# Patient Record
Sex: Female | Born: 1965
Health system: Southern US, Community
[De-identification: ages and names within clinical notes are randomized; demographics above are authoritative.]

## PROBLEM LIST (undated history)

## (undated) DIAGNOSIS — K219 Gastro-esophageal reflux disease without esophagitis: Secondary | ICD-10-CM

## (undated) DIAGNOSIS — I5020 Unspecified systolic (congestive) heart failure: Secondary | ICD-10-CM

## (undated) DIAGNOSIS — E785 Hyperlipidemia, unspecified: Secondary | ICD-10-CM

## (undated) DIAGNOSIS — E119 Type 2 diabetes mellitus without complications: Secondary | ICD-10-CM

## (undated) DIAGNOSIS — I10 Essential (primary) hypertension: Secondary | ICD-10-CM

## (undated) DIAGNOSIS — M545 Low back pain, unspecified: Secondary | ICD-10-CM

## (undated) DIAGNOSIS — I2510 Atherosclerotic heart disease of native coronary artery without angina pectoris: Secondary | ICD-10-CM

## (undated) HISTORY — PX: CORONARY ANGIOPLASTY: SHX604

## (undated) HISTORY — DX: Type 2 diabetes mellitus without complications: E11.9

## (undated) HISTORY — DX: Essential (primary) hypertension: I10

## (undated) HISTORY — DX: Gastro-esophageal reflux disease without esophagitis: K21.9

## (undated) HISTORY — DX: Low back pain, unspecified: M54.50

## (undated) HISTORY — DX: Low back pain: M54.5

## (undated) HISTORY — DX: Unspecified systolic (congestive) heart failure: I50.20

## (undated) HISTORY — PX: TUBAL LIGATION: SHX77

---

## 2005-04-09 ENCOUNTER — Other Ambulatory Visit: Admission: RE | Admit: 2005-04-09 | Discharge: 2005-04-09 | Payer: Self-pay | Admitting: Family Medicine

## 2005-04-15 ENCOUNTER — Ambulatory Visit (HOSPITAL_COMMUNITY): Admission: RE | Admit: 2005-04-15 | Discharge: 2005-04-15 | Payer: Self-pay | Admitting: Family Medicine

## 2008-08-30 ENCOUNTER — Emergency Department (HOSPITAL_COMMUNITY): Admission: EM | Admit: 2008-08-30 | Discharge: 2008-08-31 | Payer: Self-pay | Admitting: Emergency Medicine

## 2009-03-28 ENCOUNTER — Other Ambulatory Visit: Admission: RE | Admit: 2009-03-28 | Discharge: 2009-03-28 | Payer: Self-pay | Admitting: Family Medicine

## 2009-05-02 ENCOUNTER — Ambulatory Visit (HOSPITAL_COMMUNITY): Admission: RE | Admit: 2009-05-02 | Discharge: 2009-05-02 | Payer: Self-pay | Admitting: Family Medicine

## 2010-08-14 LAB — CBC
HCT: 37 % (ref 36.0–46.0)
MCHC: 32.6 g/dL (ref 30.0–36.0)
MCV: 69.4 fL — ABNORMAL LOW (ref 78.0–100.0)
Platelets: 317 10*3/uL (ref 150–400)

## 2010-08-14 LAB — DIFFERENTIAL
Basophils Relative: 1 % (ref 0–1)
Eosinophils Absolute: 0.1 10*3/uL (ref 0.0–0.7)
Lymphs Abs: 2 10*3/uL (ref 0.7–4.0)
Monocytes Absolute: 0.6 10*3/uL (ref 0.1–1.0)
Neutrophils Relative %: 60 % (ref 43–77)

## 2010-08-14 LAB — POCT I-STAT, CHEM 8
BUN: 12 mg/dL (ref 6–23)
Calcium, Ion: 1.23 mmol/L (ref 1.12–1.32)
Chloride: 107 mEq/L (ref 96–112)
Glucose, Bld: 148 mg/dL — ABNORMAL HIGH (ref 70–99)
TCO2: 27 mmol/L (ref 0–100)

## 2010-08-14 LAB — POCT CARDIAC MARKERS: Troponin i, poc: 0.13 ng/mL — ABNORMAL HIGH (ref 0.00–0.09)

## 2010-08-14 LAB — CK TOTAL AND CKMB (NOT AT ARMC)
CK, MB: 0.5 ng/mL (ref 0.3–4.0)
Relative Index: INVALID (ref 0.0–2.5)
Total CK: 57 U/L (ref 7–177)

## 2010-08-14 LAB — D-DIMER, QUANTITATIVE: D-Dimer, Quant: 0.27 ug/mL-FEU (ref 0.00–0.48)

## 2010-08-14 LAB — TROPONIN I: Troponin I: 0.01 ng/mL (ref 0.00–0.06)

## 2014-10-24 ENCOUNTER — Emergency Department (HOSPITAL_COMMUNITY): Payer: 59

## 2014-10-24 ENCOUNTER — Emergency Department (HOSPITAL_COMMUNITY)
Admission: EM | Admit: 2014-10-24 | Discharge: 2014-10-24 | Disposition: A | Discharge status: Home / Self Care | Payer: 59 | Attending: Emergency Medicine | Admitting: Emergency Medicine

## 2014-10-24 ENCOUNTER — Encounter (HOSPITAL_COMMUNITY): Payer: Self-pay | Admitting: *Deleted

## 2014-10-24 DIAGNOSIS — R000 Tachycardia, unspecified: Secondary | ICD-10-CM | POA: Insufficient documentation

## 2014-10-24 DIAGNOSIS — Z720 Tobacco use: Secondary | ICD-10-CM | POA: Diagnosis not present

## 2014-10-24 DIAGNOSIS — I10 Essential (primary) hypertension: Secondary | ICD-10-CM | POA: Insufficient documentation

## 2014-10-24 DIAGNOSIS — R0601 Orthopnea: Secondary | ICD-10-CM | POA: Diagnosis not present

## 2014-10-24 DIAGNOSIS — R0789 Other chest pain: Secondary | ICD-10-CM | POA: Diagnosis not present

## 2014-10-24 DIAGNOSIS — E119 Type 2 diabetes mellitus without complications: Secondary | ICD-10-CM | POA: Diagnosis not present

## 2014-10-24 DIAGNOSIS — R0602 Shortness of breath: Secondary | ICD-10-CM | POA: Diagnosis present

## 2014-10-24 LAB — COMPREHENSIVE METABOLIC PANEL
ALBUMIN: 3.6 g/dL (ref 3.5–5.0)
ALT: 114 U/L — ABNORMAL HIGH (ref 14–54)
ANION GAP: 7 (ref 5–15)
AST: 107 U/L — AB (ref 15–41)
Alkaline Phosphatase: 83 U/L (ref 38–126)
BILIRUBIN TOTAL: 0.5 mg/dL (ref 0.3–1.2)
BUN: 15 mg/dL (ref 6–20)
CALCIUM: 8.9 mg/dL (ref 8.9–10.3)
CHLORIDE: 108 mmol/L (ref 101–111)
CO2: 23 mmol/L (ref 22–32)
CREATININE: 1.09 mg/dL — AB (ref 0.44–1.00)
GFR calc Af Amer: >60 mL/min (ref >60)
GFR, EST NON AFRICAN AMERICAN: 58 mL/min — AB (ref >60)
Glucose, Bld: 183 mg/dL — ABNORMAL HIGH (ref 65–99)
Potassium: 3.9 mmol/L (ref 3.5–5.1)
Sodium: 138 mmol/L (ref 135–145)
TOTAL PROTEIN: 7.1 g/dL (ref 6.5–8.1)

## 2014-10-24 LAB — CBC WITH DIFFERENTIAL/PLATELET
BASOS ABS: 0 10*3/uL (ref 0.0–0.1)
Basophils Relative: 1 % (ref 0–1)
EOS PCT: 2 % (ref 0–5)
Eosinophils Absolute: 0.1 10*3/uL (ref 0.0–0.7)
HCT: 40.2 % (ref 36.0–46.0)
Hemoglobin: 13.7 g/dL (ref 12.0–15.0)
LYMPHS ABS: 1.9 10*3/uL (ref 0.7–4.0)
Lymphocytes Relative: 40 % (ref 12–46)
MCH: 22.6 pg — AB (ref 26.0–34.0)
MCHC: 34.1 g/dL (ref 30.0–36.0)
MCV: 66.2 fL — ABNORMAL LOW (ref 78.0–100.0)
MONO ABS: 0.2 10*3/uL (ref 0.1–1.0)
Monocytes Relative: 4 % (ref 3–12)
Neutro Abs: 2.6 10*3/uL (ref 1.7–7.7)
Neutrophils Relative %: 53 % (ref 43–77)
PLATELETS: 324 10*3/uL (ref 150–400)
RBC: 6.07 MIL/uL — ABNORMAL HIGH (ref 3.87–5.11)
RDW: 15.9 % — AB (ref 11.5–15.5)
WBC: 4.8 10*3/uL (ref 4.0–10.5)

## 2014-10-24 LAB — I-STAT TROPONIN, ED
TROPONIN I, POC: 0 ng/mL (ref 0.00–0.08)
Troponin i, poc: 0.02 ng/mL (ref 0.00–0.08)

## 2014-10-24 LAB — BRAIN NATRIURETIC PEPTIDE: B NATRIURETIC PEPTIDE 5: 685 pg/mL — AB (ref 0.0–100.0)

## 2014-10-24 MED ORDER — FUROSEMIDE 10 MG/ML IJ SOLN
20.0000 mg | Freq: Once | INTRAMUSCULAR | Status: AC
  Administered 2014-10-24: 20 mg via INTRAVENOUS
  Filled 2014-10-24: qty 2

## 2014-10-24 MED ORDER — ALBUTEROL SULFATE (2.5 MG/3ML) 0.083% IN NEBU
5.0000 mg | INHALATION_SOLUTION | Freq: Once | RESPIRATORY_TRACT | Status: AC
  Administered 2014-10-24: 5 mg via RESPIRATORY_TRACT
  Filled 2014-10-24: qty 6

## 2014-10-24 MED ORDER — ALBUTEROL SULFATE HFA 108 (90 BASE) MCG/ACT IN AERS: 1.0000 | INHALATION_SPRAY | RESPIRATORY_TRACT | Status: DC | PRN

## 2014-10-24 MED ORDER — FUROSEMIDE 20 MG PO TABS: 20.0000 mg | ORAL_TABLET | Freq: Every day | ORAL | Status: DC

## 2014-10-24 MED ORDER — SODIUM CHLORIDE 0.9 % IV BOLUS (SEPSIS)
250.0000 mL | Freq: Once | INTRAVENOUS | Status: AC
  Administered 2014-10-24: 250 mL via INTRAVENOUS

## 2014-10-24 NOTE — ED Notes (Signed)
Pt ambulated to bathroom with pulse ox on. Pts O2 stat stayed around 93%. Pt stated she was feeling slightly short of breath when she got back in the bed but not as short of breath as she was when she came in to the hospital. RN made aware

## 2014-10-24 NOTE — ED Notes (Signed)
MD Harrison at bedside. 

## 2014-10-24 NOTE — ED Notes (Signed)
Pt reports onset at 0300 of chest pain and difficulty "catching her breath." sob is worse when lying down. Reports recent cough. Denies swelling to extremities.

## 2014-10-24 NOTE — ED Notes (Signed)
Pt placed in gown and in bed. Pt monitored by pulse ox, bp cuff, and 12-lead. 

## 2014-10-24 NOTE — ED Notes (Signed)
Patient placed on 2L Nasal cannula.  Room air saturation at 91%, at 2L 95%.    Family at bedside.

## 2014-10-24 NOTE — ED Notes (Signed)
Dr. Harrison at bedside with patient. 

## 2014-10-24 NOTE — ED Provider Notes (Signed)
CSN: [id number]     Arrival date & time 10/24/14  0709 History   First MD Initiated Contact with Patient 10/24/14 [phone]     Chief Complaint  Patient presents with  . Chest Pain  . Shortness of Breath     (Consider location/radiation/quality/duration/timing/severity/associated sxs/prior Treatment) Patient is a 49 y.o. female presenting with chest pain and shortness of breath. The history is provided by the patient.  Chest Pain Pain location:  L chest and R chest Pain quality: tightness   Pain radiates to:  Does not radiate Pain radiates to the back: no   Pain severity:  Mild Onset quality:  Sudden Timing:  Constant Progression:  Unchanged Associated symptoms: shortness of breath   Associated symptoms: no abdominal pain, no back pain, no dizziness, no fatigue, no fever, no headache, no nausea and not vomiting   Shortness of breath:    Severity:  Mild   Onset quality:  Sudden   Duration:  5 hours   Timing:  Constant   Progression:  Unchanged Shortness of Breath Associated symptoms: no abdominal pain, no chest pain, no fever, no headaches, no neck pain and no vomiting     Past Medical History  Diagnosis Date  . Hypertension   . Diabetes mellitus without complication    History reviewed. No pertinent past surgical history. History reviewed. No pertinent family history. History  Substance Use Topics  . Smoking status: Current Every Day Smoker    Types: Cigarettes  . Smokeless tobacco: Not on file  . Alcohol Use: No   OB History    No data available     Review of Systems  Constitutional: Negative for fever and fatigue.  HENT: Negative for congestion and drooling.   Eyes: Negative for pain.  Respiratory: Positive for chest tightness and shortness of breath.   Cardiovascular: Negative for chest pain.  Gastrointestinal: Negative for nausea, vomiting, abdominal pain and diarrhea.  Genitourinary: Negative for dysuria and hematuria.  Musculoskeletal: Negative for back  pain, gait problem and neck pain.  Skin: Negative for color change.  Neurological: Negative for dizziness and headaches.  Hematological: Negative for adenopathy.  Psychiatric/Behavioral: Negative for behavioral problems.  All other systems reviewed and are negative.     Allergies  Review of patient's allergies indicates no known allergies.  Home Medications   Prior to Admission medications   Not on File   BP 192/126 mmHg  Pulse 122  Temp(Src) 98.1 F (36.7 C) (Oral)  Resp 22  SpO2 93% Physical Exam  Constitutional: She is oriented to person, place, and time. She appears well-developed and well-nourished.  HENT:  Head: Normocephalic and atraumatic.  Mouth/Throat: Oropharynx is clear and moist. No oropharyngeal exudate.  Eyes: Conjunctivae and EOM are normal. Pupils are equal, round, and reactive to light.  Neck: Normal range of motion. Neck supple.  Cardiovascular: Normal rate, regular rhythm, normal heart sounds and intact distal pulses.  Exam reveals no gallop and no friction rub.   No murmur heard. Pulmonary/Chest: She is in respiratory distress.  Tachypneic. Diminished BS bilaterally.   Abdominal: Soft. Bowel sounds are normal. There is no tenderness. There is no rebound and no guarding.  Musculoskeletal: Normal range of motion. She exhibits no edema or tenderness.  Symmetric LE's w/out focal ttp.   Neurological: She is alert and oriented to person, place, and time.  Skin: Skin is warm and dry.  Psychiatric: She has a normal mood and affect. Her behavior is normal.  Nursing note and vitals reviewed.  ED Course  Procedures (including critical care time) Labs Review Labs Reviewed  CBC WITH DIFFERENTIAL/PLATELET - Abnormal; Notable for the following:    RBC 6.07 (*)    MCV 66.2 (*)    MCH 22.6 (*)    RDW 15.9 (*)    All other components within normal limits  COMPREHENSIVE METABOLIC PANEL - Abnormal; Notable for the following:    Glucose, Bld 183 (*)     Creatinine, Ser 1.09 (*)    AST 107 (*)    ALT 114 (*)    GFR calc non Af Amer 58 (*)    All other components within normal limits  BRAIN NATRIURETIC PEPTIDE - Abnormal; Notable for the following:    B Natriuretic Peptide 685.0 (*)    All other components within normal limits  I-STAT TROPOININ, ED  Rosezena Sensor, ED    Imaging Review Dg Chest 2 View  10/24/2014   CLINICAL DATA:  Shortness of breath, progressing  EXAM: CHEST  2 VIEW  COMPARISON:  August 30, 2008  FINDINGS: There is interstitial edema. No airspace consolidation. Heart is enlarged with pulmonary venous hypertension. No adenopathy. No bone lesions.  IMPRESSION: Evidence of a degree of congestive heart failure. No edema or consolidation.   Electronically Signed   By: Bretta Bang III M.D.   On: 10/24/2014 08:01     EKG Interpretation   Date/Time:  Tuesday October 24 2014 07:16:29 EDT Ventricular Rate:  120 PR Interval:  134 QRS Duration: 78 QT Interval:  338 QTC Calculation: 478 R Axis:   51 Text Interpretation:  Sinus tachycardia LAE, consider biatrial enlargement  Anterior infarct, old Nonspecific T abnormalities, lateral leads Baseline  wander in lead(s) II III aVF Confirmed by Lanisa Ishler  MD, Shronda Boeh ([id number]) on  10/24/2014 10:10:59 AM      MDM   Final diagnoses:  SOB (shortness of breath)  Orthopnea    7:28 AM 49 y.o. female with history of hypertension, diabetes, smoker who presents with shortness of breath. She notes that she has started becoming short of breath when lying supine over the last 20-30 days. Does have chest tightness w/ the sob. Tachycardic, tachypneic on exam. O2 sat 92% on RA. Hypertensive. She denies any pleuritic-type chest pain. Symptoms seem to be only related to lying supine. No lower extremity edema noted. She does have diminished breath sounds bilaterally. We'll give an albuterol treatment and get screening labs and imaging.  1:07 PM: Mildly elevated bnp. Delta trop neg. Likely new  mild CHF. Pt now feeling much better, denies cp or sob. Not hypoxic w/ ambulation. Called trish w/ cards who will arrange close f/u. Will start low dose lasix for a few days and have pt sleep at an angle to avoid orthopnea. She does have a pcp as well. I have discussed the diagnosis/risks/treatment options with the patient and family and believe the pt to be eligible for discharge home to follow-up with her pcp and cardiology. We also discussed returning to the ED immediately if new or worsening sx occur. We discussed the sx which are most concerning (e.g., worsening sob/cp) that necessitate immediate return. Medications administered to the patient during their visit and any new prescriptions provided to the patient are listed below.  Medications given during this visit Medications  albuterol (PROVENTIL) (2.5 MG/3ML) 0.083% nebulizer solution 5 mg (5 mg Nebulization Given 10/24/14 0743)  sodium chloride 0.9 % bolus 250 mL (0 mLs Intravenous Stopped 10/24/14 0830)  furosemide (LASIX) injection 20 mg (20 mg  Intravenous Given 10/24/14 1246)    New Prescriptions   ALBUTEROL (PROVENTIL HFA;VENTOLIN HFA) 108 (90 BASE) MCG/ACT INHALER    Inhale 1-2 puffs into the lungs every 4 (four) hours as needed for wheezing or shortness of breath.   FUROSEMIDE (LASIX) 20 MG TABLET    Take 1 tablet (20 mg total) by mouth daily.      Purvis Sheffield, MD 10/24/14 1640

## 2014-11-09 ENCOUNTER — Ambulatory Visit (INDEPENDENT_AMBULATORY_CARE_PROVIDER_SITE_OTHER): Payer: Commercial Managed Care - HMO | Admitting: Cardiology

## 2014-11-09 ENCOUNTER — Encounter: Payer: Self-pay | Admitting: Cardiology

## 2014-11-09 VITALS — BP 170/100 | HR 100 | Ht 64.0 in | Wt 184.0 lb

## 2014-11-09 DIAGNOSIS — R945 Abnormal results of liver function studies: Secondary | ICD-10-CM

## 2014-11-09 DIAGNOSIS — Z720 Tobacco use: Secondary | ICD-10-CM

## 2014-11-09 DIAGNOSIS — R0601 Orthopnea: Secondary | ICD-10-CM

## 2014-11-09 DIAGNOSIS — R0602 Shortness of breath: Secondary | ICD-10-CM | POA: Diagnosis not present

## 2014-11-09 DIAGNOSIS — E118 Type 2 diabetes mellitus with unspecified complications: Secondary | ICD-10-CM

## 2014-11-09 DIAGNOSIS — I5033 Acute on chronic diastolic (congestive) heart failure: Secondary | ICD-10-CM | POA: Diagnosis not present

## 2014-11-09 DIAGNOSIS — I10 Essential (primary) hypertension: Secondary | ICD-10-CM

## 2014-11-09 DIAGNOSIS — R7989 Other specified abnormal findings of blood chemistry: Secondary | ICD-10-CM

## 2014-11-09 NOTE — Progress Notes (Signed)
Cardiology Office Note  Date: 11/09/2014   ID: Tikisha Molinaro, DOB 1965-11-07, MRN [id number]  PCP: Allean Found, MD  Consulting Cardiologist: Nona Dell, MD   Chief Complaint  Patient presents with  . Suspected CHF  . Shortness of Breath    History of Present Illness: Sandra Owens is a 49 y.o. female referred for cardiology consultation by Dr. Nicholos Johns (regular PCP is Dr. Katrinka Blazing). Records indicate that she was seen in the ER on June 21 with shortness of breath and orthopnea. Chest x-ray reported interstitial edema with cardiomegaly and findings of possible pulmonary venous hypertension. BNP was 685. Troponin I levels were normal. She was treated with albuterol as well as Lasix and discharged home for further outpatient workup. She saw Dr. Nicholos Johns on June 22 at which time it was noted that she has been out of regular medications for type 2 diabetes mellitus and hypertension. She was started on Januvia and Talmisartan HCTZ. Labwork and ECG done recently are outlined below.  She is here today with her husband. She states that she has not taken any regular medications for her diabetes or hypertension for the last few years. She reports compliance with the recently started medications, and has been feeling better in terms of her orthopnea.  She tells me that she has not noticed specific exertional shortness of breath, mainly orthopnea that wakes her up at nighttime. This began back in May. No wheezing during the daytime.  No leg swelling. Sometimes a dry cough. She does not endorse any chest tightness or heaviness.  She reports a 20 year history of diabetes and hypertension. Reports early heart disease in her father who underwent CABG in his 98s. Also family history of hypertension and type 2 diabetes mellitus.  Blood pressure was elevated today, she had not yet taken her medications this morning however.  Past Medical History  Diagnosis Date  . Essential hypertension   .  Type 2 diabetes mellitus   . GERD (gastroesophageal reflux disease)   . Low back pain     Past Surgical History  Procedure Laterality Date  . Tubal ligation      Current Outpatient Prescriptions  Medication Sig Dispense Refill  . albuterol (PROVENTIL HFA;VENTOLIN HFA) 108 (90 BASE) MCG/ACT inhaler Inhale 1-2 puffs into the lungs every 4 (four) hours as needed for wheezing or shortness of breath. 1 Inhaler 0  . Fluticasone Furoate-Vilanterol (BREO ELLIPTA) 100-25 MCG/INH AEPB Inhale into the lungs.    . furosemide (LASIX) 20 MG tablet Take 1 tablet (20 mg total) by mouth daily. 10 tablet 0  . glucose blood test strip 1 each by Other route as needed for other. Use as instructed    . ibuprofen (ADVIL,MOTRIN) 200 MG tablet Take 400 mg by mouth every 6 (six) hours as needed for mild pain or moderate pain.    . sitaGLIPtin (JANUVIA) 100 MG tablet Take 100 mg by mouth daily.    Marland Kitchen telmisartan-hydrochlorothiazide (MICARDIS HCT) 80-25 MG per tablet Take 1 tablet by mouth daily.     No current facility-administered medications for this visit.    Allergies:  Review of patient's allergies indicates no known allergies.   Social History: The patient  reports that she has been smoking Cigarettes.  She has been smoking about 0.50 packs per day. She does not have any smokeless tobacco history on file. She reports that she does not drink alcohol or use illicit drugs.   Family History: The patient's family history includes CAD in her  father; Diabetes Mellitus II in her father and mother; Hypertension in her father and mother.   ROS:  Please see the history of present illness. Otherwise, complete review of systems is positive for  Occasional brief feeling of forceful heartbeat. No dizziness or syncope. No claudication.  Occasional reflux.  All other systems are reviewed and negative.   Physical Exam: VS:  BP 170/100 mmHg  Pulse 100  Ht 5\' 4"  (1.626 m)  Wt 184 lb (83.462 kg)  BMI 31.57 kg/m2  SpO2  98%, BMI Body mass index is 31.57 kg/(m^2).  Wt Readings from Last 3 Encounters:  11/09/14 184 lb (83.462 kg)     General: Patient appears comfortable at rest. HEENT: Conjunctiva and lids normal, oropharynx clear. Neck: Supple, no elevated JVP or carotid bruits, no thyromegaly. Lungs: Clear to auscultation, nonlabored breathing at rest. Cardiac: Regular rate and rhythm, S4, no significant systolic murmur, no pericardial rub. Abdomen: Soft, nontender,bowel sounds present, no guarding or rebound. Extremities: No pitting edema, distal pulses 2+. Skin: Warm and dry. Musculoskeletal: No kyphosis. Neuropsychiatric: Alert and oriented x3, affect grossly appropriate.   ECG: Tracing from June 22 showed sinus tachycardia with possible biatrial enlargement, poor R-wave progression - rule out old anterior infarct pattern, nonspecific ST-T changes.  Recent Labwork: 10/24/2014: ALT 114*; AST 107*; B Natriuretic Peptide 685.0*; BUN 15; Creatinine, Ser 1.09*; Hemoglobin 13.7; Platelets 324; Potassium 3.9; Sodium 138   Other Studies Reviewed Today:  Chest x-ray 10/24/2014: FINDINGS: There is interstitial edema. No airspace consolidation. Heart is enlarged with pulmonary venous hypertension. No adenopathy. No bone lesions.  IMPRESSION: Evidence of a degree of congestive heart failure. No edema or Consolidation.   ASSESSMENT AND PLAN:  1.  Reported orthopnea and shortness of breath since May, improving with recent medication interventions following ER visit and primary care provider evaluation. Evaluation in the ER did suggest evidence of volume overload by chest x-ray and elevated BNP, although she could have diastolic heart failure in the setting of uncontrolled hypertension and long-standing diabetes. Plan at this time is to proceed with an echocardiogram to better assess cardiac structure and function, and also ischemic workup via Lexiscan Cardiolite.  Cardiac risk factors include long-standing  hypertension and type 2 diabetes mellitus as well as premature CAD in her father.We will plan to bring her back to the office to discuss the results and next step.  2.  Essential hypertension, blood pressure elevated, had not taken  medications yet this morning.  Will likely need further adjustments depending on above test.  3.  Type 2 diabetes mellitus , recently back on medication. Keep follow-up with primary care provider.  4.  Abnormal LFTs , AST 107 and ALT 114 based on lab work in ER. Etiology uncertain at this point. Cardiac structural evaluation pending to exclude passive congestion. She will need further workup with her primary care provider.  5.  Tobacco abuse. Importance of smoking cessation discussed.  6.  History of medication noncompliance. She reports taking her medications now regularly.  Current medicines were reviewed at length with the patient today.   Orders Placed This Encounter  Procedures  . NM Myocar Multi W/Spect W/Wall Motion / EF  . Myocardial Perfusion Imaging  . Echocardiogram    Disposition: FU with me in 3 weeks.   Signed, Jonelle Sidle, MD, [facility] 11/09/2014 9:14 AM    [facility] at [facility] [address] [place], [place] [address] Phone: [phone]; Fax: [phone]

## 2014-11-09 NOTE — Patient Instructions (Signed)
Your physician recommends that you schedule a follow-up appointment in: after tests   Your physician recommends that you continue on your current medications as directed. Please refer to the Current Medication list given to you today.    Your physician has requested that you have an echocardiogram. Echocardiography is a painless test that uses sound waves to create images of your heart. It provides your doctor with information about the size and shape of your heart and how well your heart's chambers and valves are working. This procedure takes approximately one hour. There are no restrictions for this procedure.    Your physician has requested that you have a lexiscan myoview. For further information please visit [email or web address] Please follow instruction sheet, as given.    Thank you for choosing [facility] !

## 2014-11-16 ENCOUNTER — Inpatient Hospital Stay (HOSPITAL_COMMUNITY): Admission: RE | Admit: 2014-11-16 | Payer: 59 | Source: Ambulatory Visit

## 2014-11-16 ENCOUNTER — Ambulatory Visit (HOSPITAL_COMMUNITY): Payer: 59

## 2014-11-16 ENCOUNTER — Encounter (HOSPITAL_COMMUNITY): Payer: 59

## 2014-11-16 ENCOUNTER — Other Ambulatory Visit (HOSPITAL_COMMUNITY): Payer: 59

## 2014-11-17 ENCOUNTER — Encounter (HOSPITAL_COMMUNITY)
Admission: RE | Admit: 2014-11-17 | Discharge: 2014-11-17 | Disposition: A | Discharge status: Home / Self Care | Payer: Commercial Managed Care - HMO | Source: Ambulatory Visit | Attending: Cardiology | Admitting: Cardiology

## 2014-11-17 ENCOUNTER — Encounter (HOSPITAL_COMMUNITY): Payer: Self-pay

## 2014-11-17 ENCOUNTER — Ambulatory Visit (HOSPITAL_BASED_OUTPATIENT_CLINIC_OR_DEPARTMENT_OTHER)
Admission: RE | Admit: 2014-11-17 | Discharge: 2014-11-17 | Disposition: A | Discharge status: Home / Self Care | Payer: Commercial Managed Care - HMO | Source: Ambulatory Visit | Attending: Cardiology | Admitting: Cardiology

## 2014-11-17 ENCOUNTER — Ambulatory Visit (HOSPITAL_COMMUNITY): Admission: RE | Admit: 2014-11-17 | Payer: 59 | Source: Ambulatory Visit

## 2014-11-17 DIAGNOSIS — I5033 Acute on chronic diastolic (congestive) heart failure: Secondary | ICD-10-CM | POA: Diagnosis present

## 2014-11-17 DIAGNOSIS — I509 Heart failure, unspecified: Secondary | ICD-10-CM

## 2014-11-17 DIAGNOSIS — R0602 Shortness of breath: Secondary | ICD-10-CM

## 2014-11-17 LAB — NM MYOCAR MULTI W/SPECT W/WALL MOTION / EF
CHL CUP NUCLEAR SSS: 9
CHL RATE OF PERCEIVED EXERTION: 13
CSEPEDS: 58 s
CSEPEW: 7 METS
CSEPHR: 90 %
Exercise duration (min): 4 min
LHR: 0.29
LV dias vol: 146 mL
LVSYSVOL: 117 mL
MPHR: 170 {beats}/min
Peak HR: 153 {beats}/min
Rest HR: 96 {beats}/min
SDS: 9
SRS: 0
TID: 1.09

## 2014-11-17 MED ORDER — REGADENOSON 0.4 MG/5ML IV SOLN
INTRAVENOUS | Status: AC
  Filled 2014-11-17: qty 5

## 2014-11-17 MED ORDER — TECHNETIUM TC 99M SESTAMIBI - CARDIOLITE
10.0000 | Freq: Once | INTRAVENOUS | Status: AC | PRN
  Administered 2014-11-17: 07:00:00 9 via INTRAVENOUS

## 2014-11-17 MED ORDER — SODIUM CHLORIDE 0.9 % IJ SOLN
INTRAMUSCULAR | Status: AC
  Filled 2014-11-17: qty 36

## 2014-11-17 MED ORDER — TECHNETIUM TC 99M SESTAMIBI GENERIC - CARDIOLITE
30.0000 | Freq: Once | INTRAVENOUS | Status: AC | PRN
  Administered 2014-11-17: 30 via INTRAVENOUS

## 2014-11-21 ENCOUNTER — Other Ambulatory Visit: Payer: Self-pay | Admitting: Physician Assistant

## 2014-11-21 ENCOUNTER — Ambulatory Visit (INDEPENDENT_AMBULATORY_CARE_PROVIDER_SITE_OTHER): Payer: Commercial Managed Care - HMO | Admitting: Physician Assistant

## 2014-11-21 ENCOUNTER — Encounter: Payer: Self-pay | Admitting: *Deleted

## 2014-11-21 ENCOUNTER — Encounter: Payer: Self-pay | Admitting: Physician Assistant

## 2014-11-21 VITALS — BP 150/80 | HR 115 | Ht 64.0 in | Wt 187.2 lb

## 2014-11-21 DIAGNOSIS — E119 Type 2 diabetes mellitus without complications: Secondary | ICD-10-CM

## 2014-11-21 DIAGNOSIS — R945 Abnormal results of liver function studies: Secondary | ICD-10-CM

## 2014-11-21 DIAGNOSIS — R000 Tachycardia, unspecified: Secondary | ICD-10-CM

## 2014-11-21 DIAGNOSIS — R7989 Other specified abnormal findings of blood chemistry: Secondary | ICD-10-CM

## 2014-11-21 DIAGNOSIS — I5022 Chronic systolic (congestive) heart failure: Secondary | ICD-10-CM | POA: Diagnosis not present

## 2014-11-21 DIAGNOSIS — Z720 Tobacco use: Secondary | ICD-10-CM

## 2014-11-21 DIAGNOSIS — I10 Essential (primary) hypertension: Secondary | ICD-10-CM

## 2014-11-21 DIAGNOSIS — I471 Supraventricular tachycardia: Secondary | ICD-10-CM

## 2014-11-21 DIAGNOSIS — I429 Cardiomyopathy, unspecified: Secondary | ICD-10-CM

## 2014-11-21 DIAGNOSIS — Z136 Encounter for screening for cardiovascular disorders: Secondary | ICD-10-CM

## 2014-11-21 MED ORDER — ASPIRIN EC 81 MG PO TBEC: 81.0000 mg | DELAYED_RELEASE_TABLET | Freq: Every day | ORAL | Status: DC

## 2014-11-21 MED ORDER — CARVEDILOL 6.25 MG PO TABS: 6.2500 mg | ORAL_TABLET | Freq: Two times a day (BID) | ORAL | Status: DC

## 2014-11-21 NOTE — Progress Notes (Signed)
Cardiology Office Note Date:  11/21/2014  Patient ID:  Sandra, Owens 08-20-1965, MRN [id number] PCP:  Allean Found, MD  Cardiologist:  Dr. Diona Browner -> she established care in [place] due to appointment availability, but lives and works in [place].   Chief Complaint: follow-up abnormal nuc and echo  History of Present Illness: Sandra Owens is a 49 y.o. female with history of HTN, DM, and tobacco abuse who presents back for follow-up to discuss abnormal echo and nuclear stress test. She has been having dyspnea, PND, and chest pressure since around May 2016. She was originally diagnosed with asthma but never had a history of this before. Symptoms did not improve with inhaler. She was seen in the ED 10/2014 at which time BNP was elevated and CXR showed cardiomegaly and pulmonary venous HTN. She was treated with IV Lasix with symptomatic improvement. She saw her PCP on 10/2014 at which time it was noted that she had been out of her regular meds for DM and HTN. He stopped her Lasix and put her on Januvia and telmisartan-HCTZ. ER visit also notable for elevated LFTs with AST 107/ALT 114 and creatinine 1.09. She saw Dr. Diona Browner in follow-up who was concerned for CHF. 2D echo showed EF 25-30%, mild LVH, findings c/w elevated filling pressures, multiple wall motion abnormalities, mild MR, mild-mod TR, mildly elevated PA pressures. Nuclear stress test showed findings consistent with prior myocardial infarction with peri-infarct ischemia, EF 20%. Dad had CABG in his 47s. She was brought in today to discuss next steps.  She says since being on the Micardis she feels much better - now totally asymptomatic. No further orthopnea or PND. She denies any further dyspnea or chest pressure. No LEE, although this was never a symptom of hers. No h/o bleeding. Continues to smoke. She is under recent stress because her husband lost his job.    Past Medical History  Diagnosis Date  . Essential  hypertension   . Type 2 diabetes mellitus   . GERD (gastroesophageal reflux disease)   . Low back pain   . Systolic CHF     a. Found to have new low EF 11/2014 with abnormal nuc.    Past Surgical History  Procedure Laterality Date  . Tubal ligation      Current Outpatient Prescriptions  Medication Sig Dispense Refill  . albuterol (PROVENTIL HFA;VENTOLIN HFA) 108 (90 BASE) MCG/ACT inhaler Inhale 1-2 puffs into the lungs every 4 (four) hours as needed for wheezing or shortness of breath. 1 Inhaler 0  . Fluticasone Furoate-Vilanterol (BREO ELLIPTA) 100-25 MCG/INH AEPB Inhale into the lungs.    Marland Kitchen glucose blood test strip 1 each by Other route as needed for other. Use as instructed    . ibuprofen (ADVIL,MOTRIN) 200 MG tablet Take 400 mg by mouth every 6 (six) hours as needed for mild pain or moderate pain.    . sitaGLIPtin (JANUVIA) 100 MG tablet Take 100 mg by mouth daily.    Marland Kitchen telmisartan-hydrochlorothiazide (MICARDIS HCT) 80-25 MG per tablet Take 1 tablet by mouth daily.     No current facility-administered medications for this visit.    Allergies:   Review of patient's allergies indicates no known allergies.   Social History:  The patient  reports that she has been smoking Cigarettes.  She has been smoking about 0.50 packs per day. She does not have any smokeless tobacco history on file. She reports that she does not drink alcohol or use illicit drugs.   Family History:  The patient's family history includes CAD in her father; Diabetes Mellitus II in her father and mother; Hypertension in her father and mother; Stroke in her paternal grandmother.  ROS:  Please see the history of present illness.  All other systems are reviewed and otherwise negative.   PHYSICAL EXAM:  VS:  BP 150/80 mmHg  Pulse 115  Ht 5' 4" (1.626 m)  Wt 187 lb 3.2 oz (84.913 kg)  BMI 32.12 kg/m2  SpO2 97%  LMP 09/07/2014 (Within Days) BMI: Body mass index is 32.12 kg/(m^2). Well nourished, well developed AAF  in no acute distress HEENT: normocephalic, atraumatic Neck: no JVD, carotid bruits or masses Cardiac:  normal S1, S2; mildly tachycardic but no murmurs, rubs, or gallops Lungs:  clear to auscultation bilaterally, no wheezing, rhonchi or rales Abd: soft, nontender, no hepatomegaly, + BS MS: no deformity or atrophy Ext: no edema Skin: warm and dry, no rash Neuro:  moves all extremities spontaneously, no focal abnormalities noted, follows commands Psych: euthymic mood, full affect  EKG:  Done today shows sinus tachycardia 112bpm right old anteroseptal infarct and inferolateral TWI  Recent Labs: 10/24/2014: ALT 114*; B Natriuretic Peptide 685.0*; BUN 15; Creatinine, Ser 1.09*; Hemoglobin 13.7; Platelets 324; Potassium 3.9; Sodium 138  No results found for requested labs within last 365 days.   CrCl cannot be calculated (Patient has no serum creatinine result on file.).   Wt Readings from Last 3 Encounters:  11/21/14 187 lb 3.2 oz (84.913 kg)  11/09/14 184 lb (83.462 kg)     Other studies reviewed: Additional studies/records reviewed today include: summarized above  ASSESSMENT AND PLAN:  1. Recently diagnosed chronic systolic CHF and abnormal stress test - agree that cath is next step. Will plan R/LHC later this week. Risks and benefits of cardiac catheterization have been discussed with the patient. These include bleeding, infection, kidney damage, stroke, heart attack, death. The patient understands these risks and is willing to proceed. Check pre-cath labs. Add baby aspirin. Add Coreg 6.25mg BID. We had extensive discussion about low salt diet, daily weights and monitoring for CHF symptoms. I have removed asthma from her PMH as I think it's more likely that CHF was the diagnosis all along. There is no evidence of wheezing. She is completely asymptomatic today. Will also check lipid panel for risk stratification. Will likely add statin based on result, particularly since she is a diabetic.  She will come by the [address] office tomorrow fasting for these labs. 2. Essential HTN - BP remains moderately elevated. Adding Coreg as above. 3. Diabetes mellitus - continue Januvia. F/u PCP. 4. Sinus tachycardia - question due to underlying cardiomyopathy. Recheck labs today, including TSH and free T4. Thyroid disease runs in her family. 5. Tobacco abuse - counseled regarding importance of cessation. 6. Elevated LFTS - recheck with labs. Unclear etiology, possibly previously due to passive congestion.  Disposition: F/u with Dr. McDowell 2 weeks after cath. She said it ultimately may be more convenient for her to f/u in [place] office but for now she would like to stick to [place].  Current medicines are reviewed at length with the patient today.  The patient did not have any concerns regarding medicines.  Signed, Dayna Dunn PA-C 11/21/2014 2:09 PM     [facility] [place] Location [address] [place], [place] [address] [phone]   

## 2014-11-21 NOTE — Patient Instructions (Signed)
Your physician recommends that you schedule a follow-up appointment in: 2 weeks with Dr. Diona Browner  Your physician recommends that you return for lab work in: Tomorrow (Fasting)  Your physician has recommended you make the following change in your medication:   Start Aspirin 81 mg Daily  Start Coreg 6.25 mg Two Times Daily   Your physician has requested that you have a cardiac catheterization. Cardiac catheterization is used to diagnose and/or treat various heart conditions. Doctors may recommend this procedure for a number of different reasons. The most common reason is to evaluate chest pain. Chest pain can be a symptom of coronary artery disease (CAD), and cardiac catheterization can show whether plaque is narrowing or blocking your heart's arteries. This procedure is also used to evaluate the valves, as well as measure the blood flow and oxygen levels in different parts of your heart. For further information please visit [email or web address] Please follow instruction sheet, as given.  Thank you for choosing [facility]!

## 2014-11-22 ENCOUNTER — Other Ambulatory Visit (INDEPENDENT_AMBULATORY_CARE_PROVIDER_SITE_OTHER): Payer: Commercial Managed Care - HMO | Admitting: *Deleted

## 2014-11-22 ENCOUNTER — Other Ambulatory Visit: Payer: Self-pay | Admitting: *Deleted

## 2014-11-22 DIAGNOSIS — Z01812 Encounter for preprocedural laboratory examination: Secondary | ICD-10-CM

## 2014-11-22 DIAGNOSIS — I471 Supraventricular tachycardia: Secondary | ICD-10-CM

## 2014-11-22 LAB — LIPID PANEL
CHOL/HDL RATIO: 5
Cholesterol: 207 mg/dL — ABNORMAL HIGH (ref 0–200)
HDL: 41.7 mg/dL (ref >39.00)
LDL Cholesterol: 141 mg/dL — ABNORMAL HIGH (ref 0–99)
NonHDL: 165.3
Triglycerides: 122 mg/dL (ref 0.0–149.0)
VLDL: 24.4 mg/dL (ref 0.0–40.0)

## 2014-11-22 LAB — COMPREHENSIVE METABOLIC PANEL
ALT: 14 U/L (ref 0–35)
AST: 15 U/L (ref 0–37)
Albumin: 3.7 g/dL (ref 3.5–5.2)
Alkaline Phosphatase: 52 U/L (ref 39–117)
BUN: 19 mg/dL (ref 6–23)
CO2: 26 mEq/L (ref 19–32)
Calcium: 9.6 mg/dL (ref 8.4–10.5)
Chloride: 106 mEq/L (ref 96–112)
Creatinine, Ser: 1.15 mg/dL (ref 0.40–1.20)
GFR: 64.02 mL/min (ref >60.00)
GLUCOSE: 141 mg/dL — AB (ref 70–99)
Potassium: 3.7 mEq/L (ref 3.5–5.1)
SODIUM: 139 meq/L (ref 135–145)
Total Bilirubin: 0.3 mg/dL (ref 0.2–1.2)
Total Protein: 7.1 g/dL (ref 6.0–8.3)

## 2014-11-22 LAB — CBC
HEMATOCRIT: 41.3 % (ref 36.0–46.0)
Hemoglobin: 13.3 g/dL (ref 12.0–15.0)
MCHC: 32.3 g/dL (ref 30.0–36.0)
MCV: 68.2 fl — ABNORMAL LOW (ref 78.0–100.0)
PLATELETS: 317 10*3/uL (ref 150.0–400.0)
RBC: 6.05 Mil/uL — ABNORMAL HIGH (ref 3.87–5.11)
RDW: 15.8 % — ABNORMAL HIGH (ref 11.5–15.5)
WBC: 5.4 10*3/uL (ref 4.0–10.5)

## 2014-11-22 LAB — PROTIME-INR
INR: 0.9 ratio (ref 0.8–1.0)
Prothrombin Time: 10.1 s (ref 9.6–13.1)

## 2014-11-22 NOTE — Addendum Note (Signed)
Addended by: Tonita Phoenix on: 11/22/2014 08:24 AM   Modules accepted: Orders

## 2014-11-22 NOTE — Addendum Note (Signed)
Addended by: Tonita Phoenix on: 11/22/2014 08:49 AM   Modules accepted: Orders

## 2014-11-23 ENCOUNTER — Encounter (HOSPITAL_COMMUNITY)
Admission: RE | Disposition: A | Discharge status: Home / Self Care | Payer: Self-pay | Source: Ambulatory Visit | Attending: Interventional Cardiology

## 2014-11-23 ENCOUNTER — Telehealth: Payer: Self-pay | Admitting: Physician Assistant

## 2014-11-23 ENCOUNTER — Ambulatory Visit (HOSPITAL_COMMUNITY)
Admission: RE | Admit: 2014-11-23 | Discharge: 2014-11-23 | Disposition: A | Discharge status: Home / Self Care | Payer: Commercial Managed Care - HMO | Source: Ambulatory Visit | Attending: Interventional Cardiology | Admitting: Interventional Cardiology

## 2014-11-23 ENCOUNTER — Encounter (HOSPITAL_COMMUNITY): Payer: Self-pay | Admitting: Interventional Cardiology

## 2014-11-23 DIAGNOSIS — E119 Type 2 diabetes mellitus without complications: Secondary | ICD-10-CM | POA: Insufficient documentation

## 2014-11-23 DIAGNOSIS — R000 Tachycardia, unspecified: Secondary | ICD-10-CM | POA: Insufficient documentation

## 2014-11-23 DIAGNOSIS — I10 Essential (primary) hypertension: Secondary | ICD-10-CM | POA: Insufficient documentation

## 2014-11-23 DIAGNOSIS — F17210 Nicotine dependence, cigarettes, uncomplicated: Secondary | ICD-10-CM | POA: Diagnosis not present

## 2014-11-23 DIAGNOSIS — R9439 Abnormal result of other cardiovascular function study: Secondary | ICD-10-CM | POA: Insufficient documentation

## 2014-11-23 DIAGNOSIS — I252 Old myocardial infarction: Secondary | ICD-10-CM | POA: Insufficient documentation

## 2014-11-23 DIAGNOSIS — R931 Abnormal findings on diagnostic imaging of heart and coronary circulation: Secondary | ICD-10-CM

## 2014-11-23 DIAGNOSIS — I429 Cardiomyopathy, unspecified: Secondary | ICD-10-CM | POA: Diagnosis not present

## 2014-11-23 DIAGNOSIS — I5022 Chronic systolic (congestive) heart failure: Secondary | ICD-10-CM | POA: Insufficient documentation

## 2014-11-23 DIAGNOSIS — I272 Other secondary pulmonary hypertension: Secondary | ICD-10-CM | POA: Diagnosis present

## 2014-11-23 DIAGNOSIS — I2510 Atherosclerotic heart disease of native coronary artery without angina pectoris: Secondary | ICD-10-CM | POA: Insufficient documentation

## 2014-11-23 DIAGNOSIS — R0602 Shortness of breath: Secondary | ICD-10-CM | POA: Insufficient documentation

## 2014-11-23 HISTORY — PX: CARDIAC CATHETERIZATION: SHX172

## 2014-11-23 LAB — POCT I-STAT 3, VENOUS BLOOD GAS (G3P V)
ACID-BASE DEFICIT: 2 mmol/L (ref 0.0–2.0)
Acid-base deficit: 2 mmol/L (ref 0.0–2.0)
BICARBONATE: 23.4 meq/L (ref 20.0–24.0)
BICARBONATE: 23.8 meq/L (ref 20.0–24.0)
O2 SAT: 58 %
O2 Saturation: 55 %
PCO2 VEN: 41.9 mmHg — AB (ref 45.0–50.0)
PH VEN: 7.362 — AB (ref 7.250–7.300)
TCO2: 25 mmol/L (ref 0–100)
TCO2: 25 mmol/L (ref 0–100)
pCO2, Ven: 41.7 mmHg — ABNORMAL LOW (ref 45.0–50.0)
pH, Ven: 7.358 — ABNORMAL HIGH (ref 7.250–7.300)
pO2, Ven: 30 mmHg (ref 30.0–45.0)
pO2, Ven: 31 mmHg (ref 30.0–45.0)

## 2014-11-23 LAB — GLUCOSE, CAPILLARY: Glucose-Capillary: 141 mg/dL — ABNORMAL HIGH (ref 65–99)

## 2014-11-23 LAB — POCT I-STAT 3, ART BLOOD GAS (G3+)
ACID-BASE DEFICIT: 3 mmol/L — AB (ref 0.0–2.0)
BICARBONATE: 21.2 meq/L (ref 20.0–24.0)
O2 SAT: 94 %
PCO2 ART: 34.3 mmHg — AB (ref 35.0–45.0)
TCO2: 22 mmol/L (ref 0–100)
pH, Arterial: 7.399 (ref 7.350–7.450)
pO2, Arterial: 69 mmHg — ABNORMAL LOW (ref 80.0–100.0)

## 2014-11-23 LAB — T4, FREE: Free T4: 1.13 ng/dL (ref 0.60–1.60)

## 2014-11-23 LAB — TSH: TSH: 1.97 u[IU]/mL (ref 0.35–4.50)

## 2014-11-23 SURGERY — RIGHT/LEFT HEART CATH AND CORONARY ANGIOGRAPHY

## 2014-11-23 MED ORDER — FENTANYL CITRATE (PF) 100 MCG/2ML IJ SOLN
INTRAMUSCULAR | Status: DC | PRN
  Administered 2014-11-23: 25 ug via INTRAVENOUS

## 2014-11-23 MED ORDER — HEPARIN (PORCINE) IN NACL 2-0.9 UNIT/ML-% IJ SOLN
INTRAMUSCULAR | Status: AC
  Filled 2014-11-23: qty 1500

## 2014-11-23 MED ORDER — ASPIRIN 81 MG PO CHEW
81.0000 mg | CHEWABLE_TABLET | ORAL | Status: AC
Start: 2014-11-23 — End: 2014-11-23
  Administered 2014-11-23: 81 mg via ORAL

## 2014-11-23 MED ORDER — IOHEXOL 350 MG/ML SOLN
INTRAVENOUS | Status: DC | PRN
  Administered 2014-11-23: 55 mL via INTRACARDIAC

## 2014-11-23 MED ORDER — SODIUM CHLORIDE 0.9 % IV SOLN: 250.0000 mL | INTRAVENOUS | Status: DC | PRN

## 2014-11-23 MED ORDER — MIDAZOLAM HCL 2 MG/2ML IJ SOLN
INTRAMUSCULAR | Status: AC
  Filled 2014-11-23: qty 2

## 2014-11-23 MED ORDER — SODIUM CHLORIDE 0.9 % IJ SOLN: 3.0000 mL | INTRAMUSCULAR | Status: DC | PRN

## 2014-11-23 MED ORDER — SODIUM CHLORIDE 0.9 % IJ SOLN: 3.0000 mL | Freq: Two times a day (BID) | INTRAMUSCULAR | Status: DC

## 2014-11-23 MED ORDER — FENTANYL CITRATE (PF) 100 MCG/2ML IJ SOLN
INTRAMUSCULAR | Status: AC
  Filled 2014-11-23: qty 2

## 2014-11-23 MED ORDER — MIDAZOLAM HCL 2 MG/2ML IJ SOLN
INTRAMUSCULAR | Status: DC | PRN
  Administered 2014-11-23: 2 mg via INTRAVENOUS
  Administered 2014-11-23: 1 mg via INTRAVENOUS

## 2014-11-23 MED ORDER — HEPARIN SODIUM (PORCINE) 1000 UNIT/ML IJ SOLN
INTRAMUSCULAR | Status: AC
  Filled 2014-11-23: qty 1

## 2014-11-23 MED ORDER — LIDOCAINE HCL (PF) 1 % IJ SOLN
INTRAMUSCULAR | Status: DC | PRN
  Administered 2014-11-23: 15 mL via SUBCUTANEOUS

## 2014-11-23 MED ORDER — ASPIRIN 81 MG PO CHEW
CHEWABLE_TABLET | ORAL | Status: AC
  Filled 2014-11-23: qty 1

## 2014-11-23 MED ORDER — SODIUM CHLORIDE 0.9 % IJ SOLN
INTRAMUSCULAR | Status: DC | PRN
  Administered 2014-11-23: 08:00:00 via INTRA_ARTERIAL

## 2014-11-23 MED ORDER — SODIUM CHLORIDE 0.9 % IV SOLN
INTRAVENOUS | Status: DC | PRN
  Administered 2014-11-23: 20 mL via INTRAVENOUS

## 2014-11-23 MED ORDER — LIDOCAINE HCL (PF) 1 % IJ SOLN
INTRAMUSCULAR | Status: AC
  Filled 2014-11-23: qty 30

## 2014-11-23 MED ORDER — LIDOCAINE HCL (PF) 1 % IJ SOLN
INTRAMUSCULAR | Status: DC | PRN
  Administered 2014-11-23: 09:00:00

## 2014-11-23 MED ORDER — SODIUM CHLORIDE 0.9 % IV SOLN
INTRAVENOUS | Status: DC
  Administered 2014-11-23: 06:00:00 via INTRAVENOUS

## 2014-11-23 MED ORDER — VERAPAMIL HCL 2.5 MG/ML IV SOLN
INTRAVENOUS | Status: AC
  Filled 2014-11-23: qty 2

## 2014-11-23 MED ORDER — HEPARIN SODIUM (PORCINE) 1000 UNIT/ML IJ SOLN
INTRAMUSCULAR | Status: DC | PRN
  Administered 2014-11-23: 5000 [IU] via INTRAVENOUS

## 2014-11-23 SURGICAL SUPPLY — 14 items
CATH BALLN WEDGE 5F 110CM (CATHETERS) ×2 IMPLANT
CATH INFINITI 5 FR JL3.5 (CATHETERS) ×2 IMPLANT
CATH INFINITI 5FR ANG PIGTAIL (CATHETERS) ×2 IMPLANT
CATH INFINITI JR4 5F (CATHETERS) ×2 IMPLANT
DEVICE RAD COMP TR BAND LRG (VASCULAR PRODUCTS) ×2 IMPLANT
GLIDESHEATH SLEND SS 6F .021 (SHEATH) ×2 IMPLANT
GUIDEWIRE .025 260CM (WIRE) ×2 IMPLANT
KIT HEART LEFT (KITS) ×2 IMPLANT
PACK CARDIAC CATHETERIZATION (CUSTOM PROCEDURE TRAY) ×2 IMPLANT
SHEATH FAST CATH BRACH 5F 5CM (SHEATH) ×2 IMPLANT
SYR MEDRAD MARK V 150ML (SYRINGE) ×2 IMPLANT
TRANSDUCER W/STOPCOCK (MISCELLANEOUS) ×2 IMPLANT
TUBING CIL FLEX 10 FLL-RA (TUBING) ×2 IMPLANT
WIRE SAFE-T 1.5MM-J .035X260CM (WIRE) ×2 IMPLANT

## 2014-11-23 NOTE — Interval H&P Note (Signed)
Cath Lab Visit (complete for each Cath Lab visit)  Clinical Evaluation Leading to the Procedure:    ACS: No.  Non-ACS:    Anginal Classification: CCS III  Anti-ischemic medical therapy: Minimal Therapy (1 class of medications)  Non-Invasive Test Results: High-risk stress test findings: cardiac mortality >3%/year  Prior CABG: No previous CABG  Ischemic Symptoms? CCS III (Marked limitation of ordinary activity) Anti-ischemic Medical Therapy? Minimal Therapy (1 class of medications) Non-invasive Test Results? High-risk stress test findings: cardiac mortality >3%/yr Prior CABG? No Previous CABG   Patient Information:   1-2V CAD, no prox LAD  A (8)  Indication: 18; Score: 8   Patient Information:   CTO of 1 vessel, no other CAD  A (7)  Indication: 28; Score: 7   Patient Information:   1V CAD with prox LAD  A (9)  Indication: 34; Score: 9   Patient Information:   2V-CAD with prox LAD  A (9)  Indication: 40; Score: 9   Patient Information:   3V-CAD without LMCA  A (9)  Indication: 46; Score: 9   Patient Information:   3V-CAD without LMCA With Abnormal LV systolic function  A (9)  Indication: 48; Score: 9   Patient Information:   LMCA-CAD  A (9)  Indication: 49; Score: 9   Patient Information:   2V-CAD with prox LAD PCI  A (7)  Indication: 62; Score: 7   Patient Information:   2V-CAD with prox LAD CABG  A (8)  Indication: 62; Score: 8   Patient Information:   3V-CAD without LMCA With Low CAD burden(i.e., 3 focal stenoses, low SYNTAX score) PCI  A (7)  Indication: 63; Score: 7   Patient Information:   3V-CAD without LMCA With Low CAD burden(i.e., 3 focal stenoses, low SYNTAX score) CABG  A (9)  Indication: 63; Score: 9   Patient Information:   3V-CAD without LMCA E06c - Intermediate-high CAD burden (i.e., multiple diffuse lesions, presence of CTO, or high SYNTAX score) PCI  U (4)  Indication: 64; Score:  4   Patient Information:   3V-CAD without LMCA E06c - Intermediate-high CAD burden (i.e., multiple diffuse lesions, presence of CTO, or high SYNTAX score) CABG  A (9)  Indication: 64; Score: 9   Patient Information:   LMCA-CAD With Isolated LMCA stenosis  PCI  U (6)  Indication: 65; Score: 6   Patient Information:   LMCA-CAD With Isolated LMCA stenosis  CABG  A (9)  Indication: 65; Score: 9   Patient Information:   LMCA-CAD Additional CAD, low CAD burden (i.e., 1- to 2-vessel additional involvement, low SYNTAX score) PCI  U (5)  Indication: 66; Score: 5   Patient Information:   LMCA-CAD Additional CAD, low CAD burden (i.e., 1- to 2-vessel additional involvement, low SYNTAX score) CABG  A (9)  Indication: 66; Score: 9   Patient Information:   LMCA-CAD Additional CAD, intermediate-high CAD burden (i.e., 3-vessel involvement, presence of CTO, or high SYNTAX score) PCI  I (3)  Indication: 67; Score: 3   Patient Information:   LMCA-CAD Additional CAD, intermediate-high CAD burden (i.e., 3-vessel involvement, presence of CTO, or high SYNTAX score) CABG  A (9)  Indication: 67; Score: 9     History and Physical Interval Note:  11/23/2014 7:51 AM  Sandra Owens  has presented today for surgery, with the diagnosis of hf  The various methods of treatment have been discussed with the patient and family. After consideration of risks, benefits and other options for treatment,  the patient has consented to  Procedure(s): Right/Left Heart Cath and Coronary Angiography (N/A) as a surgical intervention .  The patient's history has been reviewed, patient examined, no change in status, stable for surgery.  I have reviewed the patient's chart and labs.  Questions were answered to the patient's satisfaction.     Zebedee Segundo S.

## 2014-11-23 NOTE — Telephone Encounter (Signed)
Per Keith--TSH and Free T4 have been added to lab done yesterday.

## 2014-11-23 NOTE — H&P (View-Only) (Signed)
Cardiology Office Note Date:  11/21/2014  Patient ID:  Sandra, Owens 08-20-1965, MRN [id number] PCP:  Allean Found, MD  Cardiologist:  Dr. Diona Browner -> she established care in [place] due to appointment availability, but lives and works in [place].   Chief Complaint: follow-up abnormal nuc and echo  History of Present Illness: Sandra Owens is a 49 y.o. female with history of HTN, DM, and tobacco abuse who presents back for follow-up to discuss abnormal echo and nuclear stress test. She has been having dyspnea, PND, and chest pressure since around May 2016. She was originally diagnosed with asthma but never had a history of this before. Symptoms did not improve with inhaler. She was seen in the ED 10/2014 at which time BNP was elevated and CXR showed cardiomegaly and pulmonary venous HTN. She was treated with IV Lasix with symptomatic improvement. She saw her PCP on 10/2014 at which time it was noted that she had been out of her regular meds for DM and HTN. He stopped her Lasix and put her on Januvia and telmisartan-HCTZ. ER visit also notable for elevated LFTs with AST 107/ALT 114 and creatinine 1.09. She saw Dr. Diona Browner in follow-up who was concerned for CHF. 2D echo showed EF 25-30%, mild LVH, findings c/w elevated filling pressures, multiple wall motion abnormalities, mild MR, mild-mod TR, mildly elevated PA pressures. Nuclear stress test showed findings consistent with prior myocardial infarction with peri-infarct ischemia, EF 20%. Dad had CABG in his 47s. She was brought in today to discuss next steps.  She says since being on the Micardis she feels much better - now totally asymptomatic. No further orthopnea or PND. She denies any further dyspnea or chest pressure. No LEE, although this was never a symptom of hers. No h/o bleeding. Continues to smoke. She is under recent stress because her husband lost his job.    Past Medical History  Diagnosis Date  . Essential  hypertension   . Type 2 diabetes mellitus   . GERD (gastroesophageal reflux disease)   . Low back pain   . Systolic CHF     a. Found to have new low EF 11/2014 with abnormal nuc.    Past Surgical History  Procedure Laterality Date  . Tubal ligation      Current Outpatient Prescriptions  Medication Sig Dispense Refill  . albuterol (PROVENTIL HFA;VENTOLIN HFA) 108 (90 BASE) MCG/ACT inhaler Inhale 1-2 puffs into the lungs every 4 (four) hours as needed for wheezing or shortness of breath. 1 Inhaler 0  . Fluticasone Furoate-Vilanterol (BREO ELLIPTA) 100-25 MCG/INH AEPB Inhale into the lungs.    Marland Kitchen glucose blood test strip 1 each by Other route as needed for other. Use as instructed    . ibuprofen (ADVIL,MOTRIN) 200 MG tablet Take 400 mg by mouth every 6 (six) hours as needed for mild pain or moderate pain.    . sitaGLIPtin (JANUVIA) 100 MG tablet Take 100 mg by mouth daily.    Marland Kitchen telmisartan-hydrochlorothiazide (MICARDIS HCT) 80-25 MG per tablet Take 1 tablet by mouth daily.     No current facility-administered medications for this visit.    Allergies:   Review of patient's allergies indicates no known allergies.   Social History:  The patient  reports that she has been smoking Cigarettes.  She has been smoking about 0.50 packs per day. She does not have any smokeless tobacco history on file. She reports that she does not drink alcohol or use illicit drugs.   Family History:  The patient's family history includes CAD in her father; Diabetes Mellitus II in her father and mother; Hypertension in her father and mother; Stroke in her paternal grandmother.  ROS:  Please see the history of present illness.  All other systems are reviewed and otherwise negative.   PHYSICAL EXAM:  VS:  BP 150/80 mmHg  Pulse 115  Ht  (1.626 m)  Wt 187 lb 3.2 oz (84.913 kg)  BMI 32.12 kg/m2  SpO2 97%  LMP 09/07/2014 (Within Days) BMI: Body mass index is 32.12 kg/(m^2). Well nourished, well developed AAF  in no acute distress HEENT: normocephalic, atraumatic Neck: no JVD, carotid bruits or masses Cardiac:  normal S1, S2; mildly tachycardic but no murmurs, rubs, or gallops Lungs:  clear to auscultation bilaterally, no wheezing, rhonchi or rales Abd: soft, nontender, no hepatomegaly, + BS MS: no deformity or atrophy Ext: no edema Skin: warm and dry, no rash Neuro:  moves all extremities spontaneously, no focal abnormalities noted, follows commands Psych: euthymic mood, full affect  EKG:  Done today shows sinus tachycardia 112bpm right old anteroseptal infarct and inferolateral TWI  Recent Labs: 10/24/2014: ALT 114*; B Natriuretic Peptide 685.0*; BUN 15; Creatinine, Ser 1.09*; Hemoglobin 13.7; Platelets 324; Potassium 3.9; Sodium 138  No results found for requested labs within last 365 days.   CrCl cannot be calculated (Patient has no serum creatinine result on file.).   Wt Readings from Last 3 Encounters:  11/21/14 187 lb 3.2 oz (84.913 kg)  11/09/14 184 lb (83.462 kg)     Other studies reviewed: Additional studies/records reviewed today include: summarized above  ASSESSMENT AND PLAN:  1. Recently diagnosed chronic systolic CHF and abnormal stress test - agree that cath is next step. Will plan R/LHC later this week. Risks and benefits of cardiac catheterization have been discussed with the patient. These include bleeding, infection, kidney damage, stroke, heart attack, death. The patient understands these risks and is willing to proceed. Check pre-cath labs. Add baby aspirin. Add Coreg 6.25mg  BID. We had extensive discussion about low salt diet, daily weights and monitoring for CHF symptoms. I have removed asthma from her PMH as I think it's more likely that CHF was the diagnosis all along. There is no evidence of wheezing. She is completely asymptomatic today. Will also check lipid panel for risk stratification. Will likely add statin based on result, particularly since she is a diabetic.  She will come by the [facility] office tomorrow fasting for these labs. 2. Essential HTN - BP remains moderately elevated. Adding Coreg as above. 3. Diabetes mellitus - continue Januvia. F/u PCP. 4. Sinus tachycardia - question due to underlying cardiomyopathy. Recheck labs today, including TSH and free T4. Thyroid disease runs in her family. 5. Tobacco abuse - counseled regarding importance of cessation. 6. Elevated LFTS - recheck with labs. Unclear etiology, possibly previously due to passive congestion.  Disposition: F/u with Dr. Diona Browner 2 weeks after cath. She said it ultimately may be more convenient for her to f/u in [place] office but for now she would like to stick to [place].  Current medicines are reviewed at length with the patient today.  The patient did not have any concerns regarding medicines.  Signed, Ronie Spies PA-C 11/21/2014 2:09 PM     [facility] [place] Location [address] [place], [place] [address] [phone]

## 2014-11-23 NOTE — Discharge Instructions (Signed)
Radial Site Care Refer to this sheet in the next few weeks. These instructions provide you with information on caring for yourself after your procedure. Your caregiver may also give you more specific instructions. Your treatment has been planned according to current medical practices, but problems sometimes occur. Call your caregiver if you have any problems or questions after your procedure. HOME CARE INSTRUCTIONS  You may shower the day after the procedure.Remove the bandage (dressing) and gently wash the site with plain soap and water.Gently pat the site dry.  Do not apply powder or lotion to the site.  Do not submerge the affected site in water for 3 to 5 days.  Inspect the site at least twice daily.  Do not flex or bend the affected arm for 24 hours.  No lifting over 5 pounds (2.3 kg) for 5 days after your procedure.  Do not drive home if you are discharged the same day of the procedure. Have someone else drive you.  You may drive 24 hours after the procedure unless otherwise instructed by your caregiver.  Do not operate machinery or power tools for 24 hours.  A responsible adult should be with you for the first 24 hours after you arrive home. What to expect:  Any bruising will usually fade within 1 to 2 weeks.  Blood that collects in the tissue (hematoma) may be painful to the touch. It should usually decrease in size and tenderness within 1 to 2 weeks. SEEK IMMEDIATE MEDICAL CARE IF:  You have unusual pain at the radial site.  You have redness, warmth, swelling, or pain at the radial site.  You have drainage (other than a small amount of blood on the dressing).  You have chills.  You have a fever or persistent symptoms for more than 72 hours.  You have a fever and your symptoms suddenly get worse.  Your arm becomes pale, cool, tingly, or numb.  You have heavy bleeding from the site. Hold pressure on the site and call 911. Document Released: 05/24/2010 Document  Revised: 07/14/2011 Document Reviewed: 05/24/2010 [facility] Patient Information 2015 [place], [place]. This information is not intended to replace advice given to you by your health care provider. Make sure you discuss any questions you have with your health care provider.                    Return To Work __[name]____ was treated at our facility. INJURY OR ILLNESS WAS: _____ Work-related ___X__ Not work-related _____ Undetermined if work-related RETURN TO WORK  Employee may return to unrestricted work on: __Tuesday  July 26, 2016__  Employee may return to modified/restricted work on: __Monday July 25, 2016__ WORK ACTIVITY RESTRICTIONS Work activities not tolerated include: _____ Bending _____ Prolonged sitting ___X__ Lifting (no more that 5 pounds) _____ Squatting _____ Prolonged standing _____ Sandra Owens _____ Reaching __X___ Pushing and pulling _____ Walking _____ Other ____________________ Show this Return to Work statement to Proofreader at work as soon as possible. Your employer should be aware of your condition and can help with the necessary work activity restrictions. If you wish to return to work sooner than the date above, or if you have further problems which make it difficult for you to return at that time, please call us or your caregiver. __Dr. Shela Commons Varanasi___ Physician Name (Printed) _________________________________________ Physician Signature  __7/21/2016____ Date Document Released: 04/21/2005 Document Revised: 07/14/2011 Document Reviewed: 10/06/2006 [facility] Patient Information 2015 [place], [place]. This information is not intended to replace advice  given to you by your health care provider. Make sure you discuss any questions you have with your health care provider.

## 2014-11-23 NOTE — Progress Notes (Signed)
Site area: right brachial a 5 french venous sheath was removed.  Site Prior to Removal:  Level 0  Pressure Applied For 10 MINUTES    Minutes Beginning at 0900a  Manual:   Yes.    Patient Status During Pull:  stable  Post Pull Groin Site:  Level 0  Post Pull Instructions Given:  Yes.    Post Pull Pulses Present:  Yes.    Dressing Applied:  Yes.    Comments:  VS remain stabel during sheath pull.  Pt denies any discomfort at site at this time

## 2014-11-23 NOTE — Telephone Encounter (Signed)
This patient was supposed to have thyroid function drawn with her labwork yesterday. Can you call the lab and find out if this can be drawn with the blood still in the lab? Ideally both TSH and free T4 but if we can only draw TSH that's fine. Thx! Dayna Dunn PA-C

## 2014-11-23 NOTE — Addendum Note (Signed)
Addended by: Tonita Phoenix on: 11/23/2014 09:45 AM   Modules accepted: Orders

## 2014-11-23 NOTE — Addendum Note (Signed)
Addended by: Cresencia Asmus K on: 11/23/2014 09:45 AM   Modules accepted: Orders  

## 2014-11-24 ENCOUNTER — Ambulatory Visit: Payer: 59 | Admitting: Cardiology

## 2014-11-24 ENCOUNTER — Telehealth: Payer: Self-pay | Admitting: Interventional Cardiology

## 2014-11-24 NOTE — Telephone Encounter (Signed)
Patient was calling about labs. Patient received lab results.

## 2014-11-24 NOTE — Telephone Encounter (Signed)
New message      Pt returning call regarding cath done yesterday. Please call to discuss

## 2014-11-30 ENCOUNTER — Ambulatory Visit: Payer: Commercial Managed Care - HMO | Admitting: Physician Assistant

## 2014-12-21 ENCOUNTER — Ambulatory Visit: Payer: 59 | Admitting: Cardiology

## 2015-10-12 ENCOUNTER — Inpatient Hospital Stay (HOSPITAL_COMMUNITY)
Admission: EM | Admit: 2015-10-12 | Discharge: 2015-10-19 | DRG: 246 | Disposition: A | Discharge status: Home Health | Payer: Managed Care, Other (non HMO) | Attending: Internal Medicine | Admitting: Internal Medicine

## 2015-10-12 ENCOUNTER — Emergency Department (HOSPITAL_COMMUNITY): Payer: Managed Care, Other (non HMO)

## 2015-10-12 ENCOUNTER — Encounter (HOSPITAL_COMMUNITY): Payer: Self-pay | Admitting: Radiology

## 2015-10-12 DIAGNOSIS — R079 Chest pain, unspecified: Secondary | ICD-10-CM

## 2015-10-12 DIAGNOSIS — I420 Dilated cardiomyopathy: Secondary | ICD-10-CM

## 2015-10-12 DIAGNOSIS — K219 Gastro-esophageal reflux disease without esophagitis: Secondary | ICD-10-CM

## 2015-10-12 DIAGNOSIS — I5020 Unspecified systolic (congestive) heart failure: Secondary | ICD-10-CM | POA: Diagnosis present

## 2015-10-12 DIAGNOSIS — R0602 Shortness of breath: Secondary | ICD-10-CM

## 2015-10-12 DIAGNOSIS — R9439 Abnormal result of other cardiovascular function study: Secondary | ICD-10-CM

## 2015-10-12 DIAGNOSIS — M545 Low back pain, unspecified: Secondary | ICD-10-CM | POA: Insufficient documentation

## 2015-10-12 DIAGNOSIS — Z9119 Patient's noncompliance with other medical treatment and regimen: Secondary | ICD-10-CM

## 2015-10-12 DIAGNOSIS — I130 Hypertensive heart and chronic kidney disease with heart failure and stage 1 through stage 4 chronic kidney disease, or unspecified chronic kidney disease: Secondary | ICD-10-CM | POA: Diagnosis present

## 2015-10-12 DIAGNOSIS — I5023 Acute on chronic systolic (congestive) heart failure: Secondary | ICD-10-CM

## 2015-10-12 DIAGNOSIS — E119 Type 2 diabetes mellitus without complications: Secondary | ICD-10-CM

## 2015-10-12 DIAGNOSIS — I214 Non-ST elevation (NSTEMI) myocardial infarction: Secondary | ICD-10-CM | POA: Diagnosis not present

## 2015-10-12 DIAGNOSIS — I472 Ventricular tachycardia: Secondary | ICD-10-CM | POA: Diagnosis not present

## 2015-10-12 DIAGNOSIS — I25110 Atherosclerotic heart disease of native coronary artery with unstable angina pectoris: Secondary | ICD-10-CM

## 2015-10-12 DIAGNOSIS — F17210 Nicotine dependence, cigarettes, uncomplicated: Secondary | ICD-10-CM | POA: Diagnosis present

## 2015-10-12 DIAGNOSIS — E876 Hypokalemia: Secondary | ICD-10-CM | POA: Diagnosis not present

## 2015-10-12 DIAGNOSIS — Z7982 Long term (current) use of aspirin: Secondary | ICD-10-CM

## 2015-10-12 DIAGNOSIS — I5022 Chronic systolic (congestive) heart failure: Secondary | ICD-10-CM

## 2015-10-12 DIAGNOSIS — I2510 Atherosclerotic heart disease of native coronary artery without angina pectoris: Secondary | ICD-10-CM

## 2015-10-12 DIAGNOSIS — N189 Chronic kidney disease, unspecified: Secondary | ICD-10-CM | POA: Diagnosis present

## 2015-10-12 DIAGNOSIS — J449 Chronic obstructive pulmonary disease, unspecified: Secondary | ICD-10-CM | POA: Diagnosis present

## 2015-10-12 DIAGNOSIS — Z79899 Other long term (current) drug therapy: Secondary | ICD-10-CM

## 2015-10-12 DIAGNOSIS — E1122 Type 2 diabetes mellitus with diabetic chronic kidney disease: Secondary | ICD-10-CM | POA: Diagnosis present

## 2015-10-12 DIAGNOSIS — I272 Other secondary pulmonary hypertension: Secondary | ICD-10-CM | POA: Diagnosis present

## 2015-10-12 DIAGNOSIS — E1151 Type 2 diabetes mellitus with diabetic peripheral angiopathy without gangrene: Secondary | ICD-10-CM | POA: Diagnosis present

## 2015-10-12 DIAGNOSIS — N179 Acute kidney failure, unspecified: Secondary | ICD-10-CM | POA: Diagnosis present

## 2015-10-12 DIAGNOSIS — Z955 Presence of coronary angioplasty implant and graft: Secondary | ICD-10-CM

## 2015-10-12 DIAGNOSIS — R570 Cardiogenic shock: Secondary | ICD-10-CM | POA: Diagnosis not present

## 2015-10-12 DIAGNOSIS — I5041 Acute combined systolic (congestive) and diastolic (congestive) heart failure: Secondary | ICD-10-CM | POA: Diagnosis not present

## 2015-10-12 DIAGNOSIS — I10 Essential (primary) hypertension: Secondary | ICD-10-CM

## 2015-10-12 LAB — BASIC METABOLIC PANEL
Anion gap: 9 (ref 5–15)
BUN: 15 mg/dL (ref 6–20)
CHLORIDE: 104 mmol/L (ref 101–111)
CO2: 24 mmol/L (ref 22–32)
Calcium: 9.5 mg/dL (ref 8.9–10.3)
Creatinine, Ser: 1.42 mg/dL — ABNORMAL HIGH (ref 0.44–1.00)
GFR calc Af Amer: 49 mL/min — ABNORMAL LOW (ref >60)
GFR calc non Af Amer: 42 mL/min — ABNORMAL LOW (ref >60)
Glucose, Bld: 249 mg/dL — ABNORMAL HIGH (ref 65–99)
POTASSIUM: 3.7 mmol/L (ref 3.5–5.1)
Sodium: 137 mmol/L (ref 135–145)

## 2015-10-12 LAB — D-DIMER, QUANTITATIVE (NOT AT ARMC): D DIMER QUANT: 0.66 ug{FEU}/mL — AB (ref 0.00–0.50)

## 2015-10-12 LAB — HEPATIC FUNCTION PANEL
ALBUMIN: 3.7 g/dL (ref 3.5–5.0)
ALT: 13 U/L — AB (ref 14–54)
AST: 18 U/L (ref 15–41)
Alkaline Phosphatase: 45 U/L (ref 38–126)
Bilirubin, Direct: <0.1 mg/dL — ABNORMAL LOW (ref 0.1–0.5)
TOTAL PROTEIN: 7.4 g/dL (ref 6.5–8.1)
Total Bilirubin: 0.8 mg/dL (ref 0.3–1.2)

## 2015-10-12 LAB — CBC
HEMATOCRIT: 41.8 % (ref 36.0–46.0)
Hemoglobin: 13.7 g/dL (ref 12.0–15.0)
MCH: 21.9 pg — ABNORMAL LOW (ref 26.0–34.0)
MCHC: 32.8 g/dL (ref 30.0–36.0)
MCV: 66.8 fL — AB (ref 78.0–100.0)
PLATELETS: 270 10*3/uL (ref 150–400)
RBC: 6.26 MIL/uL — AB (ref 3.87–5.11)
RDW: 14.8 % (ref 11.5–15.5)
WBC: 5.7 10*3/uL (ref 4.0–10.5)

## 2015-10-12 LAB — LIPASE, BLOOD: Lipase: 30 U/L (ref 11–51)

## 2015-10-12 LAB — I-STAT TROPONIN, ED: Troponin i, poc: 0.11 ng/mL (ref 0.00–0.08)

## 2015-10-12 LAB — TROPONIN I
TROPONIN I: 0.13 ng/mL — AB (ref <0.031)
TROPONIN I: 3.72 ng/mL — AB (ref <0.031)

## 2015-10-12 MED ORDER — METOPROLOL TARTRATE 25 MG PO TABS: 25.0000 mg | ORAL_TABLET | Freq: Two times a day (BID) | ORAL | Status: DC

## 2015-10-12 MED ORDER — ASPIRIN 81 MG PO CHEW
324.0000 mg | CHEWABLE_TABLET | Freq: Once | ORAL | Status: AC
  Administered 2015-10-12: 162 mg via ORAL
  Filled 2015-10-12: qty 4

## 2015-10-12 MED ORDER — IOPAMIDOL (ISOVUE-370) INJECTION 76%
INTRAVENOUS | Status: AC
  Administered 2015-10-12: 80 mL
  Filled 2015-10-12: qty 100

## 2015-10-12 MED ORDER — HEPARIN (PORCINE) IN NACL 100-0.45 UNIT/ML-% IJ SOLN
1200.0000 [IU]/h | INTRAMUSCULAR | Status: DC
  Administered 2015-10-12: 900 [IU]/h via INTRAVENOUS
  Administered 2015-10-14: 1200 [IU]/h via INTRAVENOUS
  Filled 2015-10-12 (×3): qty 250

## 2015-10-12 MED ORDER — HEPARIN BOLUS VIA INFUSION
4000.0000 [IU] | Freq: Once | INTRAVENOUS | Status: AC
  Administered 2015-10-12: 4000 [IU] via INTRAVENOUS
  Filled 2015-10-12: qty 4000

## 2015-10-12 MED ORDER — SODIUM CHLORIDE 0.9 % IV BOLUS (SEPSIS)
500.0000 mL | Freq: Once | INTRAVENOUS | Status: AC
Start: 2015-10-12 — End: 2015-10-12
  Administered 2015-10-12: 500 mL via INTRAVENOUS

## 2015-10-12 NOTE — Progress Notes (Signed)
ANTICOAGULATION CONSULT NOTE - Initial Consult  Pharmacy Consult for Heparin Indication: chest pain/ACS  No Known Allergies  Patient Measurements: Height: 5\' 4"  (162.6 cm) Weight: 198 lb (89.812 kg) IBW/kg (Calculated) : 54.7 Heparin Dosing Weight: 74 kg  Vital Signs: Temp: 98.2 F (36.8 C) (06/09 1544) Temp Source: Oral (06/09 1544) BP: 146/84 mmHg (06/09 2230) Pulse Rate: 108 (06/09 2230)  Labs:  Recent Labs  10/12/15 1555 10/12/15 1836 10/12/15 2207  HGB 13.7  --   --   HCT 41.8  --   --   PLT 270  --   --   CREATININE 1.42*  --   --   TROPONINI  --  0.13* 3.72*    Estimated Creatinine Clearance: 50.8 mL/min (by C-G formula based on Cr of 1.42).   Medical History: Past Medical History  Diagnosis Date  . Essential hypertension   . Type 2 diabetes mellitus (HCC)   . GERD (gastroesophageal reflux disease)   . Low back pain   . Systolic CHF (HCC)     a. Found to have new low EF 11/2014 with abnormal nuc.    Assessment: 50 yo F presents on 6/9 with CP. Pharmacy consulted to start heparin. No PTA anticoag. CBC stable, no s/s of bleed.  Goal of Therapy:  Heparin level 0.3-0.7 units/ml Monitor platelets by anticoagulation protocol: Yes   Plan:  Give 4,000 unit heparin BOLUS Start heparin gtt at 900 units/hr Check 6 hr HL Monitor daily HL, CBC, s/s of bleed   Enzo Bi, PharmD, [facility] Clinical Pharmacist Pager [phone] 10/12/2015 10:53 PM

## 2015-10-12 NOTE — H&P (Signed)
[facility] History and Physical  Sandra Owens ZOX:[id number] DOB: 1966/01/22 DOA: 10/12/2015  Referring physician: none PCP: Allean Found, MD   Chief Complaint: "I knew this wasn't indigestion."  HPI: Sandra Owens is a 50 y.o. female with past medical significant for high blood pressure diabetes heartburn and heart failure presents to the emergency room with a chief complaint of chest pain. Patient states that she has long-standing indigestion. He states that she has never had a heart attack before but has no heart failure. She states that  She woke up this morning around 7 AM with what she thought was indigestion. She had the discomfort throughout the day.  She took an aspirin which is her normal. She had no relief of symptoms. Patient then vomited. This occurred once.  This was very concerning the patient she then took 2 more aspirin which stayed down. Patient felt that she was also diaphoretic. Denies any radiation of pain to the arms neck or jaw. Patient then made her way to the emergency room.  Patient denies any recent illness, chest trauma.  In the emergency room patient was given nitroglycerin which reversed her pain along with morphine. Patient's initial troponin was elevated but in the setting of acute kidney injury. Repeat troponin was elevated above 3. Hospitalist notify the ED provider who started heparin and called cardiology for consult. Cardiology wants hospitalist to remain as primary.  Review of Systems:  Constitutional:  No weight loss, night sweats, Fevers, chills, fatigue.  HEENT:  No headaches, Difficulty swallowing,Tooth/dental problems,Sore throat, Cardio-vascular:  See HPI GI:  No  abdominal pain, diarrhea, change in bowel habits, loss of appetite; see history of present illness Resp:  No shortness of breath with exertion or at rest. No excess mucus, no productive cough, No non-productive cough, No coughing up of blood.No change in color of  mucus.No wheezing.No chest wall deformity  Skin:  no rash or lesions.  GU:  no dysuria, change in color of urine, no urgency or frequency. No flank pain.  Musculoskeletal:   No joint pain or swelling. No decreased range of motion. No back pain.  Neuro:  No change in sensation, unilateral strength, or cognitive abilities  All other systems were reviewed and are negative.  Past Medical History  Diagnosis Date  . Essential hypertension   . Type 2 diabetes mellitus (HCC)   . GERD (gastroesophageal reflux disease)   . Low back pain   . Systolic CHF (HCC)     a. Found to have new low EF 11/2014 with abnormal nuc.   Past Surgical History  Procedure Laterality Date  . Tubal ligation    . Cardiac catheterization N/A 11/23/2014    Procedure: Right/Left Heart Cath and Coronary Angiography;  Surgeon: Corky Crafts, MD;  Location: [facility] INVASIVE CV LAB;  Service: Cardiovascular;  Laterality: N/A;   Social History:  reports that she has been smoking Cigarettes.  She has been smoking about 0.50 packs per day. She does not have any smokeless tobacco history on file. She reports that she does not drink alcohol or use illicit drugs.  No Known Allergies  Family History  Problem Relation Age of Onset  . Hypertension Father   . Diabetes Mellitus II Father   . Hypertension Mother   . Diabetes Mellitus II Mother   . CAD Father     CABG in his 18s  . Stroke Paternal Grandmother      Prior to Admission medications   Medication Sig Start Date End Date  Taking? Authorizing Provider  albuterol (PROVENTIL HFA;VENTOLIN HFA) 108 (90 BASE) MCG/ACT inhaler Inhale 1-2 puffs into the lungs every 4 (four) hours as needed for wheezing or shortness of breath. 10/24/14  Yes Purvis Sheffield, MD  aspirin EC 81 MG tablet Take 1 tablet (81 mg total) by mouth daily. 11/21/14  Yes Dayna N Dunn, PA-C  aspirin EC 81 MG tablet Take 162 mg by mouth once.   Yes Historical Provider, MD  carvedilol (COREG) 6.25 MG tablet  Take 1 tablet (6.25 mg total) by mouth 2 (two) times daily. 11/21/14  Yes Dayna N Dunn, PA-C  famotidine (PEPCID AC) 10 MG chewable tablet Chew 10 mg by mouth daily as needed for heartburn.   Yes Historical Provider, MD  Fluticasone Furoate-Vilanterol (BREO ELLIPTA) 100-25 MCG/INH AEPB Inhale 1 puff into the lungs daily as needed (for wheezing).    Yes Historical Provider, MD  glucose blood test strip 1 each by Other route See admin instructions. Check blood sugar once daily   Yes Historical Provider, MD  ibuprofen (ADVIL,MOTRIN) 200 MG tablet Take 400 mg by mouth daily as needed for mild pain or moderate pain.    Yes Historical Provider, MD  sitaGLIPtin (JANUVIA) 100 MG tablet Take 100 mg by mouth daily.   Yes Historical Provider, MD  telmisartan-hydrochlorothiazide (MICARDIS HCT) 80-25 MG per tablet Take 1 tablet by mouth daily.   Yes Historical Provider, MD  tobramycin (TOBREX) 0.3 % ophthalmic solution Place 1-2 drops into the left eye as needed (for eye infection).   Yes Historical Provider, MD   Physical Exam: Filed Vitals:   10/12/15 2115 10/12/15 2130 10/12/15 2145 10/12/15 2200  BP: 159/96 143/90 137/90 141/95  Pulse: 106 101 85 98  Temp:      TempSrc:      Resp: 21 20 21 18   Height:      Weight:      SpO2: 95% 96% 93% 96%    Wt Readings from Last 3 Encounters:  10/12/15 89.812 kg (198 lb)  11/23/14 84.823 kg (187 lb)  11/21/14 84.913 kg (187 lb 3.2 oz)    General:  Appears calm and comfortable Eyes:  PERRL, EOMI, normal lids, iris ENT:  grossly normal hearing, lips & tongue Neck:  no LAD, masses or thyromegaly Cardiovascular:  RRR, no m/r/g. No LE edema.  Respiratory:  CTA bilaterally, no w/r/r. Normal respiratory effort. Abdomen:  soft, ntnd Skin:  no rash or induration seen on limited exam Musculoskeletal:  grossly normal tone BUE/BLE Psychiatric:  grossly normal mood and affect, speech fluent and appropriate Neurologic:  CN 2-12 grossly intact, moves all extremities  in coordinated fashion.          Labs on Admission:  Basic Metabolic Panel:  Recent Labs Lab 10/12/15 1555  NA 137  K 3.7  CL 104  CO2 24  GLUCOSE 249*  BUN 15  CREATININE 1.42*  CALCIUM 9.5   Liver Function Tests:  Recent Labs Lab 10/12/15 1836  AST 18  ALT 13*  ALKPHOS 45  BILITOT 0.8  PROT 7.4  ALBUMIN 3.7    Recent Labs Lab 10/12/15 1836  LIPASE 30   No results for input(s): AMMONIA in the last 168 hours. CBC:  Recent Labs Lab 10/12/15 1555  WBC 5.7  HGB 13.7  HCT 41.8  MCV 66.8*  PLT 270   Cardiac Enzymes:  Recent Labs Lab 10/12/15 1836  TROPONINI 0.13*    BNP (last 3 results)  Recent Labs  10/24/14 1054  BNP 685.0*    ProBNP (last 3 results) No results for input(s): PROBNP in the last 8760 hours.   CREATININE: 1.42 mg/dL ABNORMAL (66/29/47 6546) Estimated creatinine clearance - 50.8 mL/min  CBG: No results for input(s): GLUCAP in the last 168 hours.  Radiological Exams on Admission: Dg Chest 2 View  10/12/2015  CLINICAL DATA:  Central chest pain, initial encounter EXAM: CHEST  2 VIEW COMPARISON:  10/24/2014 FINDINGS: Cardiac shadow is mildly enlarged. Mild central vascular congestion is noted without pulmonary edema. No sizable effusion or infiltrate is seen. No bony abnormality is noted. IMPRESSION: Mild vascular congestion without edema. Electronically Signed   By: Alcide Clever M.D.   On: 10/12/2015 16:34   Ct Angio Chest Pe W/cm &/or Wo Cm  10/12/2015  CLINICAL DATA:  Chest pain. Hypertension. Diabetes. Short of breath and bilateral leg pain for 1 week. Elevated creatinine. Systolic congestive heart failure. EXAM: CT ANGIOGRAPHY CHEST WITH CONTRAST TECHNIQUE: Multidetector CT imaging of the chest was performed using the standard protocol during bolus administration of intravenous contrast. Multiplanar CT image reconstructions and MIPs were obtained to evaluate the vascular anatomy. CONTRAST:  80 cc of Isovue 370 COMPARISON:  Chest  radiograph of earlier today.  No prior CT. FINDINGS: Mediastinum/Nodes: The quality of this exam for evaluation of pulmonary embolism is moderate to good. Despite Position and patient body habitus degradation, the bolus is relatively well timed. No evidence of pulmonary embolism. The thoracic aorta is not well opacified. No aneurysm identified. There is age advanced atherosclerosis. Moderate cardiomegaly. No mediastinal or hilar adenopathy. Lungs/Pleura: No pleural fluid. Mild degradation secondary to patient body habitus and arm position, not raised above the head. Clear lungs. Upper abdomen: Mild prominence the lateral segment left liver lobe. Possible subtle irregular hepatic capsule, including on image 90/series 4. Normal imaged portions of the spleen, stomach, pancreas, kidneys, gallbladder, right adrenal gland. Mild left adrenal thickening. Abdominal aortic atherosclerosis. Musculoskeletal: No acute osseous abnormality. Review of the MIP images confirms the above findings. IMPRESSION: 1. Mild degradation secondary to patient body habitus, arm position. No pulmonary embolism. 2. Cardiomegaly with age advanced atherosclerosis. 3. Possible mild cirrhosis. Irregular hepatic capsule could alternatively be a CT artifact specific to iterative reconstruction. Correlate with risk factors. Electronically Signed   By: Jeronimo Greaves M.D.   On: 10/12/2015 20:04    EKG: Independently reviewed. No ST elevation MI sinus tachycardia; repeat EKG after elevated troponin sinus tachycardia no ST elevation  Assessment/Plan Principal Problem:   NSTEMI (non-ST elevated myocardial infarction) [facility]) Active Problems:   Chest pain   Essential hypertension   Type 2 diabetes mellitus (HCC)   Low back pain   GERD (gastroesophageal reflux disease)   Systolic CHF (HCC)   1) NSTEMI Given hear score of 5 reported by Ed provider prior to repeat trop, troponin above 3 Cardiology to see patient in emergency room Dr. Kirtland Bouchard,  recommendations appreciated Emergency room provider provider is starting heparin Repeat EKG showed no significant changes - serial trop ordered, initial neg, 2nd elevated - prn EKG CP - prn moprhine CP - prn ntg cp - asa in ED and QD - echo ordered for AM - tele bed, cardiac monitoring - ambien for sleep prn - zofran prn for nausea - beta blocker started in the ED  AKI Baseline Cr nl, Cr on admit 1.42 Bolus of normal saline given in the emergency room Gentle hydration overnight Checking magnesium and phosphorus Urine labs ordered to calculate fractional excretion of urea  HTN  Continue outpatient myocarditis 80-25 mg Hydralazine as needed for elevated blood pressure  GERD When necessary GI cocktail Continue when necessary Pepcid 10 mg daily  DM II Tradjenta 5 mg daily, replacing Januvia Sliding scale insulin  Low Back Pain When necessary Tylenol 650 mg every 4  Sys CHF ECHO Ordered Lopressor 25 mg twice a day started in the emergency room  Lung Dz Breo prn qd Albuterol 1-2 puffs every 4 hours when necessary wheezing or shortness of breath  Code Status: full  DVT Prophylaxis: IV heparin Family Communication: hasn't spoke to over the phone, patient cell phone Disposition Plan: Pending Improvement    Haydee Salter, MD Family Medicine [facility] [email or web address] Password [email or web address]

## 2015-10-12 NOTE — Progress Notes (Signed)
Patient refused CT head without contrast, I notified Elray Mcgregor she advised to cancel CT.

## 2015-10-12 NOTE — Consult Note (Signed)
CARDIOLOGY CONSULT NOTE  Patient ID: Sandra Owens MRN: [id number] DOB/AGE: 08-Aug-1965 50 y.o.  Admit date: 10/12/2015 Primary Physician Allean Found, MD Primary Cardiologist Dr. Diona Browner Chief Complaint  Chest pain Requesting  Dr. Madilyn Hook  HPI:  The patient was evaluated last June for SOB and found to have a reduced EF of 25 - 30% on echo.  Stress test suggested prior infarct. The patient had cardiac cath in 11/2014 and was found to have a 30% mild LAD stenosis.   D3 was small with 75% stenosis.  RCA has 40% stenosis.   She had severe LV systolic dysfunction with global hypokinesis.  She did not keep three office visits that were scheduled after the cath and has not been seen in our office since July of last year.   She now presents with chest pain.  She had not had this in the past.  She has had intermittent SOB over the past year.  However, it would go away if she took her blood pressure medications.  She reports that she was having chest pain starting today.  This was a mid sternal burning.  It peaked at 9 out of 10.  It came on at rest.  She had nausea and vomiting with this.  She did have some discomfort in her arms.  She had never had this pain before.  She did not get relief at home with ASA or antacid.  In the ED she had a trop of .13 that increased to 3.72.    D dimer was elevated but there was no evidence of PE on CT.   She had improvement of her symptoms with ASA and heparin in the ED.      Past Medical History  Diagnosis Date  . Essential hypertension   . Type 2 diabetes mellitus (HCC)   . GERD (gastroesophageal reflux disease)   . Low back pain   . Systolic CHF (HCC)     a. Found to have new low EF 11/2014 with abnormal nuc.    Past Surgical History  Procedure Laterality Date  . Tubal ligation    . Cardiac catheterization N/A 11/23/2014    Procedure: Right/Left Heart Cath and Coronary Angiography;  Surgeon: Corky Crafts, MD;  Location: [facility] INVASIVE CV LAB;   Service: Cardiovascular;  Laterality: N/A;    Prior to Admission medications   Medication Sig Start Date End Date Taking? Authorizing Provider  albuterol (PROVENTIL HFA;VENTOLIN HFA) 108 (90 BASE) MCG/ACT inhaler Inhale 1-2 puffs into the lungs every 4 (four) hours as needed for wheezing or shortness of breath. 10/24/14  Yes Purvis Sheffield, MD  aspirin EC 81 MG tablet Take 1 tablet (81 mg total) by mouth daily. 11/21/14  Yes Dayna N Dunn, PA-C  aspirin EC 81 MG tablet Take 162 mg by mouth once.   Yes Historical Provider, MD  carvedilol (COREG) 6.25 MG tablet Take 1 tablet (6.25 mg total) by mouth 2 (two) times daily. 11/21/14  Yes Dayna N Dunn, PA-C  famotidine (PEPCID AC) 10 MG chewable tablet Chew 10 mg by mouth daily as needed for heartburn.   Yes Historical Provider, MD  Fluticasone Furoate-Vilanterol (BREO ELLIPTA) 100-25 MCG/INH AEPB Inhale 1 puff into the lungs daily as needed (for wheezing).    Yes Historical Provider, MD  glucose blood test strip 1 each by Other route See admin instructions. Check blood sugar once daily   Yes Historical Provider, MD  ibuprofen (ADVIL,MOTRIN) 200 MG tablet Take 400 mg by mouth daily  as needed for mild pain or moderate pain.    Yes Historical Provider, MD  sitaGLIPtin (JANUVIA) 100 MG tablet Take 100 mg by mouth daily.   Yes Historical Provider, MD  telmisartan-hydrochlorothiazide (MICARDIS HCT) 80-25 MG per tablet Take 1 tablet by mouth daily.   Yes Historical Provider, MD  tobramycin (TOBREX) 0.3 % ophthalmic solution Place 1-2 drops into the left eye as needed (for eye infection).   Yes Historical Provider, MD    No Known Allergies  (Not in a hospital admission) Family History  Problem Relation Age of Onset  . Hypertension Father   . Diabetes Mellitus II Father   . Hypertension Mother   . Diabetes Mellitus II Mother   . CAD Father     CABG in his 31s  . Stroke Paternal Grandmother     Social History   Social History  . Marital Status: Single     Spouse Name: N/A  . Number of Children: N/A  . Years of Education: N/A   Occupational History  . Not on file.   Social History Main Topics  . Smoking status: Current Every Day Smoker -- 0.50 packs/day    Types: Cigarettes  . Smokeless tobacco: Not on file  . Alcohol Use: No  . Drug Use: No  . Sexual Activity: Not on file   Other Topics Concern  . Not on file   Social History Narrative     ROS:    As stated in the HPI and negative for all other systems.  Physical Exam: Blood pressure 146/84, pulse 108, temperature 98.2 F (36.8 C), temperature source Oral, resp. rate 27, height 5\' 4"  (1.626 m), weight 198 lb (89.812 kg), last menstrual period 08/05/2015, SpO2 94 %.  GENERAL:  Well appearing HEENT:  Pupils equal round and reactive, fundi not visualized, oral mucosa unremarkable NECK:  No jugular venous distention, waveform within normal limits, carotid upstroke brisk and symmetric, no bruits, no thyromegaly LYMPHATICS:  No cervical, inguinal adenopathy LUNGS:  Clear to auscultation bilaterally BACK:  No CVA tenderness CHEST:  Unremarkable HEART:  PMI not displaced or sustained,S1 and S2 within normal limits, no S3, no S4, no clicks, no rubs, no murmurs ABD:  Flat, positive bowel sounds normal in frequency in pitch, no bruits, no rebound, no guarding, no midline pulsatile mass, no hepatomegaly, no splenomegaly EXT:  2 plus pulses throughout, no edema, no cyanosis no clubbing SKIN:  No rashes no nodules NEURO:  Cranial nerves II through XII grossly intact, motor grossly intact throughout PSYCH:  Cognitively intact, oriented to person place and time  Labs: Lab Results  Component Value Date   BUN 15 10/12/2015   Lab Results  Component Value Date   CREATININE 1.42* 10/12/2015   Lab Results  Component Value Date   NA 137 10/12/2015   K 3.7 10/12/2015   CL 104 10/12/2015   CO2 24 10/12/2015   Lab Results  Component Value Date   TROPONINI 3.72* 10/12/2015   Lab  Results  Component Value Date   WBC 5.7 10/12/2015   HGB 13.7 10/12/2015   HCT 41.8 10/12/2015   MCV 66.8* 10/12/2015   PLT 270 10/12/2015   Lab Results  Component Value Date   CHOL 207* 11/22/2014   HDL 41.70 11/22/2014   LDLCALC 141* 11/22/2014   TRIG 122.0 11/22/2014   CHOLHDL 5 11/22/2014   Lab Results  Component Value Date   ALT 13* 10/12/2015   AST 18 10/12/2015   ALKPHOS 45 10/12/2015  BILITOT 0.8 10/12/2015    Radiology:   CXR: Cardiac shadow is mildly enlarged. Mild central vascular congestion is noted without pulmonary edema. No sizable effusion or infiltrate is seen. No bony abnormality is noted.  CT:  1. Mild degradation secondary to patient body habitus, arm position. No pulmonary embolism. 2. Cardiomegaly with age advanced atherosclerosis. 3. Possible mild cirrhosis. Irregular hepatic capsule could alternatively be a CT artifact specific to iterative reconstruction. Correlate with risk factors.   EKG:  NSR, rate 103, axis WNL, intervals WNL, poor anterior R wave progression,  No acute ST T wave changes.    ASSESSMENT AND PLAN:   NSTEMI:  The patient presents with chest pain.  Although she had non obstructive CAD in the past she will need repeat cardiac cath.  She will be admitted with IV heparin.  I will start NTG paste.  She can continue her other meds as listed.   I will place her on the board.  Orders can be written this weekend.   CARDIOMYOPATHY:  The patient has a non ischemic cardiomyopathy.  She was lost to follow up because of lack of insurance.  However, she now has insurance and will follow up.  We discussed at length the need to take meds.  We talked about the physiology of CHF.  We talked about salt restriction.    HTN:  This is being managed in the context of treating his CHF    Signed: Rollene Rotunda 10/12/2015, 11:03 PM

## 2015-10-12 NOTE — ED Provider Notes (Signed)
CSN: [id number]     Arrival date & time 10/12/15  1537 History   First MD Initiated Contact with Patient 10/12/15 1735     Chief Complaint  Patient presents with  . Chest Pain     Patient is a 50 y.o. female presenting with chest pain. The history is provided by the patient. No language interpreter was used.  Chest Pain  Sandra Owens is a 50 y.o. female who presents to the Emergency Department complaining of chest pain.  She developed a central burning chest pain that started today. She first thought it was indigestion and she took her home medications. Her pain persisted and worsened later in the day and she had nausea. She took 2 baby aspirin and then vomited. She then took 2 more baby aspirin. Her pain was constant until she arrived to the emergency department and now her pain is resolved. She has intermittent shortness of breath, none today. No fevers, cough, abdominal pain pain, diarrhea, leg swelling or pain. She had a cardiac cath one year ago that showed no obstructive coronary artery disease. She also has a history of hypertension, diabetes.  Past Medical History  Diagnosis Date  . Essential hypertension   . Type 2 diabetes mellitus (HCC)   . GERD (gastroesophageal reflux disease)   . Low back pain   . Systolic CHF (HCC)     a. Found to have new low EF 11/2014 with abnormal nuc.   Past Surgical History  Procedure Laterality Date  . Tubal ligation    . Cardiac catheterization N/A 11/23/2014    Procedure: Right/Left Heart Cath and Coronary Angiography;  Surgeon: Corky Crafts, MD;  Location: [facility] INVASIVE CV LAB;  Service: Cardiovascular;  Laterality: N/A;   Family History  Problem Relation Age of Onset  . Hypertension Father   . Diabetes Mellitus II Father   . Hypertension Mother   . Diabetes Mellitus II Mother   . CAD Father     CABG in his 29s  . Stroke Paternal Grandmother    Social History  Substance Use Topics  . Smoking status: Current Every Day Smoker --  0.50 packs/day    Types: Cigarettes  . Smokeless tobacco: None  . Alcohol Use: No   OB History    No data available     Review of Systems  Cardiovascular: Positive for chest pain.  All other systems reviewed and are negative.     Allergies  Review of patient's allergies indicates no known allergies.  Home Medications   Prior to Admission medications   Medication Sig Start Date End Date Taking? Authorizing Provider  albuterol (PROVENTIL HFA;VENTOLIN HFA) 108 (90 BASE) MCG/ACT inhaler Inhale 1-2 puffs into the lungs every 4 (four) hours as needed for wheezing or shortness of breath. 10/24/14  Yes Purvis Sheffield, MD  aspirin EC 81 MG tablet Take 1 tablet (81 mg total) by mouth daily. 11/21/14  Yes Dayna N Dunn, PA-C  aspirin EC 81 MG tablet Take 162 mg by mouth once.   Yes Historical Provider, MD  carvedilol (COREG) 6.25 MG tablet Take 1 tablet (6.25 mg total) by mouth 2 (two) times daily. 11/21/14  Yes Dayna N Dunn, PA-C  famotidine (PEPCID AC) 10 MG chewable tablet Chew 10 mg by mouth daily as needed for heartburn.   Yes Historical Provider, MD  Fluticasone Furoate-Vilanterol (BREO ELLIPTA) 100-25 MCG/INH AEPB Inhale 1 puff into the lungs daily as needed (for wheezing).    Yes Historical Provider, MD  glucose  blood test strip 1 each by Other route See admin instructions. Check blood sugar once daily   Yes Historical Provider, MD  ibuprofen (ADVIL,MOTRIN) 200 MG tablet Take 400 mg by mouth daily as needed for mild pain or moderate pain.    Yes Historical Provider, MD  sitaGLIPtin (JANUVIA) 100 MG tablet Take 100 mg by mouth daily.   Yes Historical Provider, MD  telmisartan-hydrochlorothiazide (MICARDIS HCT) 80-25 MG per tablet Take 1 tablet by mouth daily.   Yes Historical Provider, MD  tobramycin (TOBREX) 0.3 % ophthalmic solution Place 1-2 drops into the left eye as needed (for eye infection).   Yes Historical Provider, MD   BP 161/91 mmHg  Pulse 102  Temp(Src) 98.2 F (36.8 C)  (Oral)  Resp 21  Ht 5\' 4"  (1.626 m)  Wt 194 lb 3.6 oz (88.1 kg)  BMI 33.32 kg/m2  SpO2 96%  LMP 08/05/2015 (Approximate) Physical Exam  Constitutional: She is oriented to person, place, and time. She appears well-developed and well-nourished.  HENT:  Head: Normocephalic and atraumatic.  Cardiovascular: Normal rate and regular rhythm.   No murmur heard. Pulmonary/Chest: Effort normal and breath sounds normal. No respiratory distress.  Abdominal: Soft. There is no tenderness. There is no rebound and no guarding.  Musculoskeletal: She exhibits no edema or tenderness.  Neurological: She is alert and oriented to person, place, and time.  Skin: Skin is warm and dry.  Psychiatric: She has a normal mood and affect. Her behavior is normal.  Nursing note and vitals reviewed.   ED Course  Procedures    Labs Review Labs Reviewed  BASIC METABOLIC PANEL - Abnormal; Notable for the following:    Glucose, Bld 249 (*)    Creatinine, Ser 1.42 (*)    GFR calc non Af Amer 42 (*)    GFR calc Af Amer 49 (*)    All other components within normal limits  CBC - Abnormal; Notable for the following:    RBC 6.26 (*)    MCV 66.8 (*)    MCH 21.9 (*)    All other components within normal limits  D-DIMER, QUANTITATIVE (NOT AT [facility]) - Abnormal; Notable for the following:    D-Dimer, Quant 0.66 (*)    All other components within normal limits  TROPONIN I - Abnormal; Notable for the following:    Troponin I 0.13 (*)    All other components within normal limits  HEPATIC FUNCTION PANEL - Abnormal; Notable for the following:    ALT 13 (*)    Bilirubin, Direct <0.1 (*)    All other components within normal limits  TROPONIN I - Abnormal; Notable for the following:    Troponin I 3.72 (*)    All other components within normal limits  I-STAT TROPOININ, ED - Abnormal; Notable for the following:    Troponin i, poc 0.11 (*)    All other components within normal limits  LIPASE, BLOOD  CBC  HEPARIN LEVEL  (UNFRACTIONATED)  TROPONIN I  TROPONIN I  TROPONIN I  MAGNESIUM  PHOSPHORUS  UREA NITROGEN, URINE  CREATININE, URINE, RANDOM  OSMOLALITY, URINE    Imaging Review Dg Chest 2 View  10/12/2015  CLINICAL DATA:  Central chest pain, initial encounter EXAM: CHEST  2 VIEW COMPARISON:  10/24/2014 FINDINGS: Cardiac shadow is mildly enlarged. Mild central vascular congestion is noted without pulmonary edema. No sizable effusion or infiltrate is seen. No bony abnormality is noted. IMPRESSION: Mild vascular congestion without edema. Electronically Signed   By: Loraine Leriche  Lukens M.D.   On: 10/12/2015 16:34   Ct Angio Chest Pe W/cm &/or Wo Cm  10/12/2015  CLINICAL DATA:  Chest pain. Hypertension. Diabetes. Short of breath and bilateral leg pain for 1 week. Elevated creatinine. Systolic congestive heart failure. EXAM: CT ANGIOGRAPHY CHEST WITH CONTRAST TECHNIQUE: Multidetector CT imaging of the chest was performed using the standard protocol during bolus administration of intravenous contrast. Multiplanar CT image reconstructions and MIPs were obtained to evaluate the vascular anatomy. CONTRAST:  80 cc of Isovue 370 COMPARISON:  Chest radiograph of earlier today.  No prior CT. FINDINGS: Mediastinum/Nodes: The quality of this exam for evaluation of pulmonary embolism is moderate to good. Despite Position and patient body habitus degradation, the bolus is relatively well timed. No evidence of pulmonary embolism. The thoracic aorta is not well opacified. No aneurysm identified. There is age advanced atherosclerosis. Moderate cardiomegaly. No mediastinal or hilar adenopathy. Lungs/Pleura: No pleural fluid. Mild degradation secondary to patient body habitus and arm position, not raised above the head. Clear lungs. Upper abdomen: Mild prominence the lateral segment left liver lobe. Possible subtle irregular hepatic capsule, including on image 90/series 4. Normal imaged portions of the spleen, stomach, pancreas, kidneys,  gallbladder, right adrenal gland. Mild left adrenal thickening. Abdominal aortic atherosclerosis. Musculoskeletal: No acute osseous abnormality. Review of the MIP images confirms the above findings. IMPRESSION: 1. Mild degradation secondary to patient body habitus, arm position. No pulmonary embolism. 2. Cardiomegaly with age advanced atherosclerosis. 3. Possible mild cirrhosis. Irregular hepatic capsule could alternatively be a CT artifact specific to iterative reconstruction. Correlate with risk factors. Electronically Signed   By: Jeronimo Greaves M.D.   On: 10/12/2015 20:04   I have personally reviewed and evaluated these images and lab results as part of my medical decision-making.   EKG Interpretation   Date/Time:  Friday October 12 2015 15:40:57 EDT Ventricular Rate:  118 PR Interval:  146 QRS Duration: 80 QT Interval:  334 QTC Calculation: 468 R Axis:   29 Text Interpretation:  Sinus tachycardia Biatrial enlargement Nonspecific T  wave abnormality Abnormal ECG Confirmed by Lincoln Brigham [phone]) on 10/12/2015  3:50:43 PM Also confirmed by Lincoln Brigham [phone]), editor [place]  [facility],  [place] ([id number])  on 10/12/2015 4:00:41 PM      MDM   Final diagnoses:  Essential hypertension  Type 2 diabetes mellitus without complication, without long-term current use of insulin (HCC)  Low back pain, unspecified back pain laterality, with sciatica presence unspecified  Gastroesophageal reflux disease, esophagitis presence not specified  Chronic systolic congestive heart failure [facility])    Patient here for evaluation of chest pain, resolved in the emergency department. EKG with no ischemic changes. Initial troponin is minimally elevated. D-dimer sent which was elevated and CT PE study was obtained. CT scan is negative for acute PE. Medicine consultative for admission for chest pain. Repeat troponin was sent to the emergency department and significantly elevated compared to earlier EKG. Patient was started on a  heparin drip for presumed non-ST elevation MI. Cardiology consultation for evaluation as well. Patient updated findings and studies a recommendation for admission for further treatment. Patient is in agreement with plan.    Tilden Fossa, MD 10/12/15 2358

## 2015-10-12 NOTE — ED Notes (Signed)
Pt ambulated to and from restroom with no difficulty

## 2015-10-12 NOTE — ED Notes (Signed)
Pt complaining of burning central chest pain. Pt with nausea and vomiting today. Pt hypertensive at triage.

## 2015-10-12 NOTE — ED Notes (Signed)
Hx: cardiac cath, and stress test.

## 2015-10-12 NOTE — ED Notes (Signed)
Patient transported to CT 

## 2015-10-12 NOTE — ED Notes (Signed)
Dr. Melynda Ripple aware of pts troponin level

## 2015-10-13 ENCOUNTER — Encounter (HOSPITAL_COMMUNITY): Payer: Self-pay | Admitting: Cardiology

## 2015-10-13 ENCOUNTER — Observation Stay (HOSPITAL_BASED_OUTPATIENT_CLINIC_OR_DEPARTMENT_OTHER): Payer: Managed Care, Other (non HMO)

## 2015-10-13 DIAGNOSIS — E119 Type 2 diabetes mellitus without complications: Secondary | ICD-10-CM

## 2015-10-13 DIAGNOSIS — M545 Low back pain: Secondary | ICD-10-CM | POA: Diagnosis present

## 2015-10-13 DIAGNOSIS — K219 Gastro-esophageal reflux disease without esophagitis: Secondary | ICD-10-CM

## 2015-10-13 DIAGNOSIS — E1151 Type 2 diabetes mellitus with diabetic peripheral angiopathy without gangrene: Secondary | ICD-10-CM | POA: Diagnosis present

## 2015-10-13 DIAGNOSIS — N189 Chronic kidney disease, unspecified: Secondary | ICD-10-CM | POA: Diagnosis present

## 2015-10-13 DIAGNOSIS — N179 Acute kidney failure, unspecified: Secondary | ICD-10-CM | POA: Diagnosis present

## 2015-10-13 DIAGNOSIS — I5022 Chronic systolic (congestive) heart failure: Secondary | ICD-10-CM | POA: Diagnosis not present

## 2015-10-13 DIAGNOSIS — I420 Dilated cardiomyopathy: Secondary | ICD-10-CM | POA: Diagnosis present

## 2015-10-13 DIAGNOSIS — E1122 Type 2 diabetes mellitus with diabetic chronic kidney disease: Secondary | ICD-10-CM | POA: Diagnosis present

## 2015-10-13 DIAGNOSIS — R079 Chest pain, unspecified: Secondary | ICD-10-CM | POA: Diagnosis present

## 2015-10-13 DIAGNOSIS — J449 Chronic obstructive pulmonary disease, unspecified: Secondary | ICD-10-CM | POA: Diagnosis present

## 2015-10-13 DIAGNOSIS — I214 Non-ST elevation (NSTEMI) myocardial infarction: Secondary | ICD-10-CM | POA: Diagnosis present

## 2015-10-13 DIAGNOSIS — I5023 Acute on chronic systolic (congestive) heart failure: Secondary | ICD-10-CM | POA: Diagnosis present

## 2015-10-13 DIAGNOSIS — I25110 Atherosclerotic heart disease of native coronary artery with unstable angina pectoris: Secondary | ICD-10-CM | POA: Diagnosis not present

## 2015-10-13 DIAGNOSIS — I272 Other secondary pulmonary hypertension: Secondary | ICD-10-CM | POA: Diagnosis present

## 2015-10-13 DIAGNOSIS — R570 Cardiogenic shock: Secondary | ICD-10-CM | POA: Diagnosis not present

## 2015-10-13 DIAGNOSIS — Z9119 Patient's noncompliance with other medical treatment and regimen: Secondary | ICD-10-CM | POA: Diagnosis not present

## 2015-10-13 DIAGNOSIS — I429 Cardiomyopathy, unspecified: Secondary | ICD-10-CM | POA: Diagnosis not present

## 2015-10-13 DIAGNOSIS — E876 Hypokalemia: Secondary | ICD-10-CM | POA: Diagnosis not present

## 2015-10-13 DIAGNOSIS — I2510 Atherosclerotic heart disease of native coronary artery without angina pectoris: Secondary | ICD-10-CM | POA: Diagnosis present

## 2015-10-13 DIAGNOSIS — I10 Essential (primary) hypertension: Secondary | ICD-10-CM | POA: Diagnosis not present

## 2015-10-13 DIAGNOSIS — I472 Ventricular tachycardia: Secondary | ICD-10-CM | POA: Diagnosis not present

## 2015-10-13 DIAGNOSIS — I5041 Acute combined systolic (congestive) and diastolic (congestive) heart failure: Secondary | ICD-10-CM | POA: Diagnosis not present

## 2015-10-13 DIAGNOSIS — I130 Hypertensive heart and chronic kidney disease with heart failure and stage 1 through stage 4 chronic kidney disease, or unspecified chronic kidney disease: Secondary | ICD-10-CM | POA: Diagnosis present

## 2015-10-13 DIAGNOSIS — Z7982 Long term (current) use of aspirin: Secondary | ICD-10-CM | POA: Diagnosis not present

## 2015-10-13 DIAGNOSIS — Z79899 Other long term (current) drug therapy: Secondary | ICD-10-CM | POA: Diagnosis not present

## 2015-10-13 DIAGNOSIS — F17210 Nicotine dependence, cigarettes, uncomplicated: Secondary | ICD-10-CM | POA: Diagnosis present

## 2015-10-13 LAB — ECHOCARDIOGRAM COMPLETE
AO mean calculated velocity dopler: 90 cm/s
AV Area VTI index: 0.8 cm2/m2
AV Peak grad: 7 mmHg
AV VEL mean LVOT/AV: 0.73
AV area mean vel ind: 0.95 cm2/m2
AV peak Index: 0.82
AVAREAMEANV: 1.86 cm2
AVAREAVTI: 1.59 cm2
AVG: 4 mmHg
AVPKVEL: 128 cm/s
Ao pk vel: 0.63 m/s
CHL CUP AV VEL: 1.56
CHL CUP MV DEC (S): 148
EWDT: 148 ms
FS: 13 % — AB (ref 28–44)
HEIGHTINCHES: 65 in
IV/PV OW: 1
LA ID, A-P, ES: 40 mm
LA diam end sys: 40 mm
LA diam index: 2.05 cm/m2
LA vol A4C: 69.3 ml
LV PW d: 9 mm — AB (ref 0.6–1.1)
LV SIMPSON'S DISK: 31
LV TDI E'MEDIAL: 5.87
LV dias vol index: 101 mL/m2
LV dias vol: 197 mL — AB (ref 46–106)
LV e' LATERAL: 3.24 cm/s
LV sys vol index: 70 mL/m2
LV sys vol: 137 mL — AB (ref 14–42)
LVOT VTI: 14.7 cm
LVOT area: 2.54 cm2
LVOT diameter: 18 mm
LVOT peak VTI: 0.61 cm
LVOT peak grad rest: 3 mmHg
LVOTPV: 80 cm/s
LVOTSV: 37 mL
MVPKEVEL: 1.3 m/s
Stroke v: 60 ml
TAPSE: 20.4 mm
TDI e' lateral: 3.24
VTI: 24 cm
Valve area index: 0.8
Valve area: 1.56 cm2
WEIGHTICAEL: 3088 [oz_av]

## 2015-10-13 LAB — CREATININE, URINE, RANDOM: Creatinine, Urine: 81.4 mg/dL

## 2015-10-13 LAB — HEPARIN LEVEL (UNFRACTIONATED)
HEPARIN UNFRACTIONATED: 0.55 [IU]/mL (ref 0.30–0.70)
Heparin Unfractionated: 0.49 IU/mL (ref 0.30–0.70)

## 2015-10-13 LAB — BASIC METABOLIC PANEL
Anion gap: 6 (ref 5–15)
BUN: 13 mg/dL (ref 6–20)
CALCIUM: 8.9 mg/dL (ref 8.9–10.3)
CO2: 23 mmol/L (ref 22–32)
Chloride: 108 mmol/L (ref 101–111)
Creatinine, Ser: 1.27 mg/dL — ABNORMAL HIGH (ref 0.44–1.00)
GFR calc Af Amer: 56 mL/min — ABNORMAL LOW (ref >60)
GFR, EST NON AFRICAN AMERICAN: 48 mL/min — AB (ref >60)
GLUCOSE: 124 mg/dL — AB (ref 65–99)
Potassium: 3.6 mmol/L (ref 3.5–5.1)
Sodium: 137 mmol/L (ref 135–145)

## 2015-10-13 LAB — CBC
HCT: 38.5 % (ref 36.0–46.0)
Hemoglobin: 12.7 g/dL (ref 12.0–15.0)
MCH: 21.9 pg — ABNORMAL LOW (ref 26.0–34.0)
MCHC: 33 g/dL (ref 30.0–36.0)
MCV: 66.4 fL — ABNORMAL LOW (ref 78.0–100.0)
Platelets: 233 10*3/uL (ref 150–400)
RBC: 5.8 MIL/uL — AB (ref 3.87–5.11)
RDW: 14.8 % (ref 11.5–15.5)
WBC: 6.3 10*3/uL (ref 4.0–10.5)

## 2015-10-13 LAB — TROPONIN I
TROPONIN I: 4.5 ng/mL — AB (ref <0.031)
TROPONIN I: 3.92 ng/mL — AB (ref <0.031)
Troponin I: 3.22 ng/mL (ref <0.031)

## 2015-10-13 LAB — MAGNESIUM: MAGNESIUM: 1.9 mg/dL (ref 1.7–2.4)

## 2015-10-13 LAB — PHOSPHORUS: Phosphorus: 2.5 mg/dL (ref 2.5–4.6)

## 2015-10-13 LAB — GLUCOSE, CAPILLARY
GLUCOSE-CAPILLARY: 183 mg/dL — AB (ref 65–99)
Glucose-Capillary: 122 mg/dL — ABNORMAL HIGH (ref 65–99)
Glucose-Capillary: 195 mg/dL — ABNORMAL HIGH (ref 65–99)
Glucose-Capillary: 138 mg/dL — ABNORMAL HIGH (ref 65–99)

## 2015-10-13 LAB — OSMOLALITY, URINE: Osmolality, Ur: 595 mOsm/kg (ref 300–900)

## 2015-10-13 MED ORDER — ONDANSETRON HCL 4 MG/2ML IJ SOLN
4.0000 mg | Freq: Four times a day (QID) | INTRAMUSCULAR | Status: DC | PRN
  Filled 2015-10-13: qty 2

## 2015-10-13 MED ORDER — HYDRALAZINE HCL 20 MG/ML IJ SOLN: 10.0000 mg | Freq: Three times a day (TID) | INTRAMUSCULAR | Status: DC | PRN

## 2015-10-13 MED ORDER — ACETAMINOPHEN 325 MG PO TABS: 650.0000 mg | ORAL_TABLET | ORAL | Status: DC | PRN

## 2015-10-13 MED ORDER — TELMISARTAN-HCTZ 80-25 MG PO TABS: 1.0000 | ORAL_TABLET | Freq: Every day | ORAL | Status: DC

## 2015-10-13 MED ORDER — SODIUM CHLORIDE 0.9 % IV SOLN
INTRAVENOUS | Status: DC
  Administered 2015-10-13: 01:00:00 via INTRAVENOUS

## 2015-10-13 MED ORDER — CARVEDILOL 6.25 MG PO TABS
6.2500 mg | ORAL_TABLET | Freq: Two times a day (BID) | ORAL | Status: DC
  Administered 2015-10-13 – 2015-10-14 (×4): 6.25 mg via ORAL
  Filled 2015-10-13 (×4): qty 1

## 2015-10-13 MED ORDER — FLUTICASONE FUROATE-VILANTEROL 100-25 MCG/INH IN AEPB: 1.0000 | INHALATION_SPRAY | Freq: Every day | RESPIRATORY_TRACT | Status: DC | PRN

## 2015-10-13 MED ORDER — HEPARIN SODIUM (PORCINE) 5000 UNIT/ML IJ SOLN: 5000.0000 [IU] | Freq: Three times a day (TID) | INTRAMUSCULAR | Status: DC

## 2015-10-13 MED ORDER — NITROGLYCERIN 2 % TD OINT
2.0000 [in_us] | TOPICAL_OINTMENT | Freq: Four times a day (QID) | TRANSDERMAL | Status: DC
  Administered 2015-10-13 – 2015-10-14 (×6): 2 [in_us] via TOPICAL
  Filled 2015-10-13: qty 30

## 2015-10-13 MED ORDER — ZOLPIDEM TARTRATE 5 MG PO TABS: 5.0000 mg | ORAL_TABLET | Freq: Every evening | ORAL | Status: DC | PRN

## 2015-10-13 MED ORDER — HEPARIN BOLUS VIA INFUSION
2000.0000 [IU] | Freq: Once | INTRAVENOUS | Status: AC
  Administered 2015-10-13: 2000 [IU] via INTRAVENOUS
  Filled 2015-10-13: qty 2000

## 2015-10-13 MED ORDER — MORPHINE SULFATE (PF) 2 MG/ML IV SOLN: 2.0000 mg | INTRAVENOUS | Status: DC | PRN

## 2015-10-13 MED ORDER — INSULIN ASPART 100 UNIT/ML ~~LOC~~ SOLN
0.0000 [IU] | Freq: Three times a day (TID) | SUBCUTANEOUS | Status: DC
  Administered 2015-10-13 (×2): 4 [IU] via SUBCUTANEOUS
  Administered 2015-10-13: 3 [IU] via SUBCUTANEOUS
  Administered 2015-10-14 – 2015-10-16 (×5): 4 [IU] via SUBCUTANEOUS
  Administered 2015-10-16: 3 [IU] via SUBCUTANEOUS
  Administered 2015-10-17: 4 [IU] via SUBCUTANEOUS
  Administered 2015-10-18: 7 [IU] via SUBCUTANEOUS
  Administered 2015-10-18 – 2015-10-19 (×2): 3 [IU] via SUBCUTANEOUS
  Administered 2015-10-19: 4 [IU] via SUBCUTANEOUS

## 2015-10-13 MED ORDER — LINAGLIPTIN 5 MG PO TABS
5.0000 mg | ORAL_TABLET | Freq: Every day | ORAL | Status: DC
  Administered 2015-10-13 – 2015-10-19 (×6): 5 mg via ORAL
  Filled 2015-10-13 (×6): qty 1

## 2015-10-13 MED ORDER — IRBESARTAN 300 MG PO TABS
300.0000 mg | ORAL_TABLET | Freq: Every day | ORAL | Status: DC
  Administered 2015-10-13 – 2015-10-15 (×3): 300 mg via ORAL
  Filled 2015-10-13 (×3): qty 2

## 2015-10-13 MED ORDER — HYDROCHLOROTHIAZIDE 25 MG PO TABS
25.0000 mg | ORAL_TABLET | Freq: Every day | ORAL | Status: DC
  Administered 2015-10-13: 25 mg via ORAL
  Filled 2015-10-13: qty 1

## 2015-10-13 MED ORDER — NICOTINE 21 MG/24HR TD PT24
21.0000 mg | MEDICATED_PATCH | Freq: Every day | TRANSDERMAL | Status: DC
  Administered 2015-10-13: 21 mg via TRANSDERMAL
  Filled 2015-10-13: qty 1

## 2015-10-13 MED ORDER — FAMOTIDINE 20 MG PO TABS: 10.0000 mg | ORAL_TABLET | Freq: Every day | ORAL | Status: DC | PRN

## 2015-10-13 MED ORDER — ALBUTEROL SULFATE (2.5 MG/3ML) 0.083% IN NEBU: 2.5000 mg | INHALATION_SOLUTION | RESPIRATORY_TRACT | Status: DC | PRN

## 2015-10-13 MED ORDER — GI COCKTAIL ~~LOC~~: 30.0000 mL | Freq: Four times a day (QID) | ORAL | Status: DC | PRN

## 2015-10-13 NOTE — Progress Notes (Signed)
ANTICOAGULATION CONSULT NOTE Pharmacy Consult for Heparin Indication: chest pain/ACS  No Known Allergies  Patient Measurements: Height: 5\' 5"  (165.1 cm) Weight: 193 lb (87.544 kg) IBW/kg (Calculated) : 57 Heparin Dosing Weight: 74 kg  Vital Signs: Temp: 98.4 F (36.9 C) (06/10 0629) Temp Source: Oral (06/10 0629) BP: 127/73 mmHg (06/10 0629) Pulse Rate: 101 (06/10 0103)  Labs:  Recent Labs  10/12/15 1555  10/12/15 2207 10/13/15 0218 10/13/15 0523  HGB 13.7  --   --   --  12.7  HCT 41.8  --   --   --  38.5  PLT 270  --   --   --  233  HEPARINUNFRC  --   --   --   --  <0.10*  CREATININE 1.42*  --   --   --   --   TROPONINI  --   < > 3.72* 4.50* 3.92*  < > = values in this interval not displayed.  Estimated Creatinine Clearance: 51.2 mL/min (by C-G formula based on Cr of 1.42).  Assessment: 50 y.o. female with chest pain for heparin  Goal of Therapy:  Heparin level 0.3-0.7 units/ml Monitor platelets by anticoagulation protocol: Yes   Plan:  Heparin 2000 units IV bolus, then increase heparin 1200 units/hr Check heparin level in 6 hours.   Geannie Risen, PharmD, BCPS   10/13/2015 6:38 AM

## 2015-10-13 NOTE — Progress Notes (Signed)
ANTICOAGULATION CONSULT NOTE Pharmacy Consult for Heparin Indication: chest pain/ACS  No Known Allergies  Patient Measurements: Height: 5\' 5"  (165.1 cm) Weight: 193 lb (87.544 kg) IBW/kg (Calculated) : 57 Heparin Dosing Weight: 74 kg  Vital Signs: Temp: 98.9 F (37.2 C) (06/10 1929) Temp Source: Oral (06/10 1929) BP: 146/91 mmHg (06/10 1929) Pulse Rate: 103 (06/10 1929)  Labs:  Recent Labs  10/12/15 1555  10/13/15 0218 10/13/15 0523 10/13/15 0754 10/13/15 1050 10/13/15 1442 10/13/15 2122  HGB 13.7  --   --  12.7  --   --   --   --   HCT 41.8  --   --  38.5  --   --   --   --   PLT 270  --   --  233  --   --   --   --   HEPARINUNFRC  --   --   --  <0.10*  --   --  0.55 0.49  CREATININE 1.42*  --   --   --   --  1.27*  --   --   TROPONINI  --   < > 4.50* 3.92* 3.22*  --   --   --   < > = values in this interval not displayed.  Estimated Creatinine Clearance: 57.3 mL/min (by C-G formula based on Cr of 1.27).  Assessment: 50 y.o. female with chest pain, continuing on heparin. CT Angio negative for PE. CBC wnl, no bleed documented.  Heparin level therapeutic x 2  Goal of Therapy:  Heparin level 0.3-0.7 units/ml Monitor platelets by anticoagulation protocol: Yes   Plan:  Heparin at 1200 units/hr Follow up AM labs  Thank you Okey Regal, PharmD [phone] 10/13/2015 10:13 PM

## 2015-10-13 NOTE — Progress Notes (Signed)
*  PRELIMINARY RESULTS* Echocardiogram 2D Echocardiogram has been performed.  Doristine Section 10/13/2015, 3:00 PM

## 2015-10-13 NOTE — Progress Notes (Signed)
[facility]                                                                              Patient Demographics  Sandra Owens, is a 50 y.o. female, DOB - 04-17-1966, LPF:[id number]  Admit date - 10/12/2015   Admitting Physician Haydee Salter, MD  Outpatient Primary MD for the patient is Allean Found, MD  Outpatient specialists:   LOS -   days    Chief Complaint  Patient presents with  . Chest Pain       Brief summary   Sandra Owens is a 50 y.o. female With hypertension, diabetes, GERD presented to ED with chest pain. Patient reported that she woke up at 7 AM on the morning of admission and thought she had indigestion however that her discomfort and to new throughout the day with no relief of symptoms. Hence presented to ED.  Troponin was elevated initially at 0.13 subsequently 3.72. Patient was admitted for further workup and cardiology was consulted. She was started on heparin drip. CT angiogram of the chest showed no pulmonary embolism.  Assessment & Plan    Principal Problem:   NSTEMI (non-ST elevated myocardial infarction) (HCC) - She had nonobstructive coronary artery disease in the past, currently with positive troponins on heparin drip. - Cardiology consulted, recommending cardiac cath - Continue aspirin, beta blocker, ARB - Obtain lipid panel, 2-D echo - Given hear score of 5 reported by Ed provider prior to repeat trop, troponin above 3   Acute kidney injury: Baseline Cr nl, Cr on admit 1.42 - Obtain  BMET, DC HCTZ  Essential HTN - Continue beta blocker, ARB, HCTZ discontinued - IV hydralazine as needed  GERD - Continue Pepcid  DM II Tradjenta 5 mg daily, replacing Januvia Sliding scale insulin  Low Back Pain When necessary Tylenol 650 mg every 4 hours  Nonischemic Cardiomyopathy Follow 2-D echo, continue beta blocker, ARB  COPD Currently stable, no wheezing Breo prn qd, Albuterol 1-2 puffs every 4 hours when  necessary wheezing or shortness of breath  Code Status: Full code  DVT Prophylaxis:   heparin   Family Communication: Discussed in detail with the patient, all imaging results, lab results explained to the patient    Disposition Plan:    Time Spent in minutes   25 minutes  Procedures:    Consultants:   Cardiology  Antimicrobials:      Medications  Scheduled Meds: . carvedilol  6.25 mg Oral BID WC  . irbesartan  300 mg Oral Daily   And  . hydrochlorothiazide  25 mg Oral Daily  . insulin aspart  0-20 Units Subcutaneous TID WC  . linagliptin  5 mg Oral Daily  . nicotine  21 mg Transdermal Daily  . nitroGLYCERIN  2 inch Topical Q6H   Continuous Infusions: . sodium chloride 75 mL/hr at 10/13/15 1000  . heparin 1,200 Units/hr (10/13/15 1000)   PRN Meds:.acetaminophen, albuterol, famotidine, fluticasone furoate-vilanterol, gi cocktail, hydrALAZINE, morphine injection, ondansetron (ZOFRAN) IV, zolpidem   Antibiotics   Anti-infectives    None        Subjective:   Sandra Owens was seen and  examined today. Currently no chest pain, has some back pain otherwise stable, no shortness of breath. Patient denies dizziness, abdominal pain, N/V/D/C, new weakness, numbess, tingling. No acute events overnight.    Objective:   Filed Vitals:   10/12/15 2345 10/13/15 0017 10/13/15 0103 10/13/15 0629  BP: 172/75 180/88 177/86 127/73  Pulse: 107 97 101   Temp:   98.1 F (36.7 C) 98.4 F (36.9 C)  TempSrc:   Oral Oral  Resp: Height:    (1.651 m)   Weight:   87.544 kg (193 lb)   SpO2: 96% 96% 99% 100%    Intake/Output Summary (Last 24 hours) at 10/13/15 1030 Last data filed at 10/13/15 0910  Gross per 24 hour  Intake    675 ml  Output    700 ml  Net    -25 ml     Wt Readings from Last 3 Encounters:  10/13/15 87.544 kg (193 lb)  11/23/14 84.823 kg (187 lb)  11/21/14 84.913 kg (187 lb 3.2 oz)     Exam  General: Alert and oriented x  3, NAD  HEENT:  PERRLA, EOMI, Anicteric Sclera, mucous membranes moist.   Neck: Supple, no JVD, no masses  Cardiovascular: S1 S2 auscultated, no rubs, murmurs or gallops. Regular rate and rhythm.  Respiratory: Clear to auscultation bilaterally, no wheezing, rales or rhonchi  Gastrointestinal: Soft, nontender, nondistended, + bowel sounds  Ext: no cyanosis clubbing or edema  Neuro: AAOx3, Cr N's II- XII. Strength 5/5 upper and lower extremities bilaterally  Skin: No rashes  Psych: Normal affect and demeanor, alert and oriented x3    Data Reviewed:  I have personally reviewed following labs and imaging studies  Micro Results No results found for this or any previous visit (from the past 240 hour(s)).  Radiology Reports Dg Chest 2 View  10/12/2015  CLINICAL DATA:  Central chest pain, initial encounter EXAM: CHEST  2 VIEW COMPARISON:  10/24/2014 FINDINGS: Cardiac shadow is mildly enlarged. Mild central vascular congestion is noted without pulmonary edema. No sizable effusion or infiltrate is seen. No bony abnormality is noted. IMPRESSION: Mild vascular congestion without edema. Electronically Signed   By: Alcide Clever M.D.   On: 10/12/2015 16:34   Ct Angio Chest Pe W/cm &/or Wo Cm  10/12/2015  CLINICAL DATA:  Chest pain. Hypertension. Diabetes. Short of breath and bilateral leg pain for 1 week. Elevated creatinine. Systolic congestive heart failure. EXAM: CT ANGIOGRAPHY CHEST WITH CONTRAST TECHNIQUE: Multidetector CT imaging of the chest was performed using the standard protocol during bolus administration of intravenous contrast. Multiplanar CT image reconstructions and MIPs were obtained to evaluate the vascular anatomy. CONTRAST:  80 cc of Isovue 370 COMPARISON:  Chest radiograph of earlier today.  No prior CT. FINDINGS: Mediastinum/Nodes: The quality of this exam for evaluation of pulmonary embolism is moderate to good. Despite Position and patient body habitus degradation, the bolus is  relatively well timed. No evidence of pulmonary embolism. The thoracic aorta is not well opacified. No aneurysm identified. There is age advanced atherosclerosis. Moderate cardiomegaly. No mediastinal or hilar adenopathy. Lungs/Pleura: No pleural fluid. Mild degradation secondary to patient body habitus and arm position, not raised above the head. Clear lungs. Upper abdomen: Mild prominence the lateral segment left liver lobe. Possible subtle irregular hepatic capsule, including on image 90/series 4. Normal imaged portions of the spleen, stomach, pancreas, kidneys, gallbladder, right adrenal gland. Mild left adrenal thickening. Abdominal aortic atherosclerosis. Musculoskeletal: No acute osseous  abnormality. Review of the MIP images confirms the above findings. IMPRESSION: 1. Mild degradation secondary to patient body habitus, arm position. No pulmonary embolism. 2. Cardiomegaly with age advanced atherosclerosis. 3. Possible mild cirrhosis. Irregular hepatic capsule could alternatively be a CT artifact specific to iterative reconstruction. Correlate with risk factors. Electronically Signed   By: Jeronimo Greaves M.D.   On: 10/12/2015 20:04    Lab Data:  CBC:  Recent Labs Lab 10/12/15 1555 10/13/15 0523  WBC 5.7 6.3  HGB 13.7 12.7  HCT 41.8 38.5  MCV 66.8* 66.4*  PLT 270 233   Basic Metabolic Panel:  Recent Labs Lab 10/12/15 1555 10/13/15 0218  NA 137  --   K 3.7  --   CL 104  --   CO2 24  --   GLUCOSE 249*  --   BUN 15  --   CREATININE 1.42*  --   CALCIUM 9.5  --   MG  --  1.9  PHOS  --  2.5   GFR: Estimated Creatinine Clearance: 51.2 mL/min (by C-G formula based on Cr of 1.42). Liver Function Tests:  Recent Labs Lab 10/12/15 1836  AST 18  ALT 13*  ALKPHOS 45  BILITOT 0.8  PROT 7.4  ALBUMIN 3.7    Recent Labs Lab 10/12/15 1836  LIPASE 30   No results for input(s): AMMONIA in the last 168 hours. Coagulation Profile: No results for input(s): INR, PROTIME in the last  168 hours. Cardiac Enzymes:  Recent Labs Lab 10/12/15 1836 10/12/15 2207 10/13/15 0218 10/13/15 0523 10/13/15 0754  TROPONINI 0.13* 3.72* 4.50* 3.92* 3.22*   BNP (last 3 results) No results for input(s): PROBNP in the last 8760 hours. HbA1C: No results for input(s): HGBA1C in the last 72 hours. CBG:  Recent Labs Lab 10/13/15 0729  GLUCAP 183*   Lipid Profile: No results for input(s): CHOL, HDL, LDLCALC, TRIG, CHOLHDL, LDLDIRECT in the last 72 hours. Thyroid Function Tests: No results for input(s): TSH, T4TOTAL, FREET4, T3FREE, THYROIDAB in the last 72 hours. Anemia Panel: No results for input(s): VITAMINB12, FOLATE, FERRITIN, TIBC, IRON, RETICCTPCT in the last 72 hours. Urine analysis: No results found for: COLORURINE, APPEARANCEUR, LABSPEC, PHURINE, GLUCOSEU, HGBUR, BILIRUBINUR, KETONESUR, PROTEINUR, UROBILINOGEN, NITRITE, Hurshel Party M.D. [facility] 10/13/2015, 10:30 AM  Pager: [phone] Between 7am to 7pm - call Pager - [phone]  After 7pm go to [email or web address] - password [email or web address]  Call night coverage person covering after 7pm

## 2015-10-13 NOTE — Progress Notes (Signed)
ANTICOAGULATION CONSULT NOTE Pharmacy Consult for Heparin Indication: chest pain/ACS  No Known Allergies  Patient Measurements: Height: 5\' 5"  (165.1 cm) Weight: 193 lb (87.544 kg) IBW/kg (Calculated) : 57 Heparin Dosing Weight: 74 kg  Vital Signs: Temp: 98.6 F (37 C) (06/10 1114) Temp Source: Oral (06/10 1114) BP: 158/86 mmHg (06/10 1114) Pulse Rate: 106 (06/10 1114)  Labs:  Recent Labs  10/12/15 1555  10/13/15 0218 10/13/15 0523 10/13/15 0754 10/13/15 1050 10/13/15 1442  HGB 13.7  --   --  12.7  --   --   --   HCT 41.8  --   --  38.5  --   --   --   PLT 270  --   --  233  --   --   --   HEPARINUNFRC  --   --   --  <0.10*  --   --  0.55  CREATININE 1.42*  --   --   --   --  1.27*  --   TROPONINI  --   < > 4.50* 3.92* 3.22*  --   --   < > = values in this interval not displayed.  Estimated Creatinine Clearance: 57.3 mL/min (by C-G formula based on Cr of 1.27).  Assessment: 50 y.o. female with chest pain, continuing on heparin. CT Angio negative for PE. CBC wnl, no bleed documented.  Goal of Therapy:  Heparin level 0.3-0.7 units/ml Monitor platelets by anticoagulation protocol: Yes   Plan:  Heparin at 1200 units/hr 6h HL to confirm Daily HL and CBC while on therapy F/u DOT (typically 48 hours with NSTEMI)  Babs Bertin, PharmD, [facility] Clinical Pharmacist Pager [phone] 10/13/2015 4:03 PM

## 2015-10-14 DIAGNOSIS — I5022 Chronic systolic (congestive) heart failure: Secondary | ICD-10-CM | POA: Insufficient documentation

## 2015-10-14 DIAGNOSIS — K219 Gastro-esophageal reflux disease without esophagitis: Secondary | ICD-10-CM | POA: Insufficient documentation

## 2015-10-14 DIAGNOSIS — I420 Dilated cardiomyopathy: Secondary | ICD-10-CM

## 2015-10-14 DIAGNOSIS — I2510 Atherosclerotic heart disease of native coronary artery without angina pectoris: Secondary | ICD-10-CM

## 2015-10-14 LAB — BASIC METABOLIC PANEL
ANION GAP: 6 (ref 5–15)
Anion gap: 8 (ref 5–15)
BUN: 15 mg/dL (ref 6–20)
BUN: 14 mg/dL (ref 6–20)
CHLORIDE: 106 mmol/L (ref 101–111)
CO2: 24 mmol/L (ref 22–32)
CO2: 23 mmol/L (ref 22–32)
CREATININE: 1.33 mg/dL — AB (ref 0.44–1.00)
Calcium: 9.2 mg/dL (ref 8.9–10.3)
Calcium: 9.3 mg/dL (ref 8.9–10.3)
Chloride: 108 mmol/L (ref 101–111)
Creatinine, Ser: 1.33 mg/dL — ABNORMAL HIGH (ref 0.44–1.00)
GFR calc Af Amer: 53 mL/min — ABNORMAL LOW (ref >60)
GFR calc Af Amer: 53 mL/min — ABNORMAL LOW (ref >60)
GFR calc non Af Amer: 45 mL/min — ABNORMAL LOW (ref >60)
GFR, EST NON AFRICAN AMERICAN: 45 mL/min — AB (ref >60)
GLUCOSE: 207 mg/dL — AB (ref 65–99)
Glucose, Bld: 157 mg/dL — ABNORMAL HIGH (ref 65–99)
POTASSIUM: 3.8 mmol/L (ref 3.5–5.1)
POTASSIUM: 3.9 mmol/L (ref 3.5–5.1)
SODIUM: 138 mmol/L (ref 135–145)
SODIUM: 137 mmol/L (ref 135–145)

## 2015-10-14 LAB — LIPID PANEL
CHOL/HDL RATIO: 5.2 ratio
CHOLESTEROL: 199 mg/dL (ref 0–200)
HDL: 38 mg/dL — ABNORMAL LOW (ref >40)
LDL CALC: 137 mg/dL — AB (ref 0–99)
Triglycerides: 122 mg/dL (ref <150)
VLDL: 24 mg/dL (ref 0–40)

## 2015-10-14 LAB — GLUCOSE, CAPILLARY
GLUCOSE-CAPILLARY: 165 mg/dL — AB (ref 65–99)
GLUCOSE-CAPILLARY: 155 mg/dL — AB (ref 65–99)
GLUCOSE-CAPILLARY: 119 mg/dL — AB (ref 65–99)
Glucose-Capillary: 165 mg/dL — ABNORMAL HIGH (ref 65–99)

## 2015-10-14 LAB — HEPARIN LEVEL (UNFRACTIONATED): Heparin Unfractionated: 0.46 IU/mL (ref 0.30–0.70)

## 2015-10-14 LAB — PROTIME-INR
INR: 1.13 (ref 0.00–1.49)
PROTHROMBIN TIME: 14.7 s (ref 11.6–15.2)

## 2015-10-14 LAB — CBC
HEMATOCRIT: 37.7 % (ref 36.0–46.0)
Hemoglobin: 12.2 g/dL (ref 12.0–15.0)
MCH: 21.8 pg — AB (ref 26.0–34.0)
MCHC: 32.4 g/dL (ref 30.0–36.0)
MCV: 67.4 fL — ABNORMAL LOW (ref 78.0–100.0)
Platelets: 251 10*3/uL (ref 150–400)
RBC: 5.59 MIL/uL — ABNORMAL HIGH (ref 3.87–5.11)
RDW: 15 % (ref 11.5–15.5)
WBC: 6.9 10*3/uL (ref 4.0–10.5)

## 2015-10-14 LAB — UREA NITROGEN, URINE: Urea Nitrogen, Ur: 479 mg/dL

## 2015-10-14 MED ORDER — ASPIRIN EC 81 MG PO TBEC
81.0000 mg | DELAYED_RELEASE_TABLET | Freq: Every day | ORAL | Status: DC
  Administered 2015-10-14: 81 mg via ORAL
  Filled 2015-10-14: qty 1

## 2015-10-14 MED ORDER — SODIUM CHLORIDE 0.9 % IV SOLN: INTRAVENOUS | Status: DC

## 2015-10-14 MED ORDER — NITROGLYCERIN 0.4 MG SL SUBL: 0.4000 mg | SUBLINGUAL_TABLET | SUBLINGUAL | Status: DC | PRN

## 2015-10-14 MED ORDER — NICOTINE 14 MG/24HR TD PT24
14.0000 mg | MEDICATED_PATCH | Freq: Every day | TRANSDERMAL | Status: DC
  Administered 2015-10-14 – 2015-10-19 (×5): 14 mg via TRANSDERMAL
  Filled 2015-10-14 (×6): qty 1

## 2015-10-14 MED ORDER — INSULIN ASPART 100 UNIT/ML ~~LOC~~ SOLN
3.0000 [IU] | Freq: Three times a day (TID) | SUBCUTANEOUS | Status: DC
  Administered 2015-10-14 – 2015-10-16 (×4): 3 [IU] via SUBCUTANEOUS

## 2015-10-14 MED ORDER — CARVEDILOL 12.5 MG PO TABS
12.5000 mg | ORAL_TABLET | Freq: Two times a day (BID) | ORAL | Status: DC
  Administered 2015-10-14 – 2015-10-15 (×2): 12.5 mg via ORAL
  Filled 2015-10-14 (×2): qty 1

## 2015-10-14 MED ORDER — ATORVASTATIN CALCIUM 80 MG PO TABS
80.0000 mg | ORAL_TABLET | Freq: Every day | ORAL | Status: DC
  Administered 2015-10-14: 80 mg via ORAL
  Filled 2015-10-14: qty 1

## 2015-10-14 MED ORDER — ASPIRIN 81 MG PO CHEW
81.0000 mg | CHEWABLE_TABLET | ORAL | Status: AC
  Administered 2015-10-15: 81 mg via ORAL
  Filled 2015-10-14: qty 1

## 2015-10-14 NOTE — Progress Notes (Signed)
Paged K Schorr NP to ask whether patient's normal saline IV infusion should be decreased since patient's EF is 15-20% and fluids have been infusing at a rate of 75 mL/hr since approximately 1 am. Patient currently has no complaints of shortness of breath; lungs clear. Orders received to decrease rate of infusion to 50 mL/hr. Will continue to closely monitor patient.

## 2015-10-14 NOTE — Progress Notes (Signed)
[facility]                                                                              Patient Demographics  Sandra Owens, is a 50 y.o. female, DOB - 01-09-1966, ZOX:[id number]  Admit date - 10/12/2015   Admitting Physician Haydee Salter, MD  Outpatient Primary MD for the patient is Allean Found, MD  Outpatient specialists:   LOS - 1  days    Chief Complaint  Patient presents with  . Chest Pain       Brief summary   Sandra Owens is a 50 y.o. female With hypertension, diabetes, GERD presented to ED with chest pain. Patient reported that she woke up at 7 AM on the morning of admission and thought she had indigestion however that her discomfort and to new throughout the day with no relief of symptoms. Hence presented to ED.  Troponin was elevated initially at 0.13 subsequently 3.72. Patient was admitted for further workup and cardiology was consulted. She was started on heparin drip. CT angiogram of the chest showed no pulmonary embolism.  Assessment & Plan    Principal Problem:   NSTEMI (non-ST elevated myocardial infarction) (HCC) - She had nonobstructive coronary artery disease in the past, currently with positive troponins on heparin drip. - Cardiology consulted, recommending cardiac cath, planned 6/12 - Continue aspirin, beta blocker, ARB - 2-D echo showed EF of 15-20% with diffuse hypokinesis - Feeling somewhat short of breath, ? Lasix   Acute kidney injury: Baseline Cr nl, Cr on admit 1.42 - Creatinine stable at 1.3, discontinued HCTZ   Essential HTN - Continue beta blocker, ARB, HCTZ discontinued - IV hydralazine as needed  GERD - Continue Pepcid  DM II Tradjenta 5 mg daily, replacing Januvia Sliding scale insulin, placed on NovoLog meal coverage  Low Back Pain When necessary Tylenol 650 mg every 4 hours  Nonischemic Cardiomyopathy  echo shows EF of 15-20% with diffuse hypokinesis, continue beta blocker,  ARB  COPD Breo prn qd, Albuterol 1-2 puffs every 4 hours when necessary wheezing or shortness of breath  Code Status: Full code  DVT Prophylaxis:   heparin   Family Communication: Discussed in detail with the patient, all imaging results, lab results explained to the patient    Disposition Plan:    Time Spent in minutes   25 minutes  Procedures:    Consultants:   Cardiology  Antimicrobials:      Medications  Scheduled Meds: . [START ON 10/15/2015] aspirin  81 mg Oral Pre-Cath  . aspirin EC  81 mg Oral Daily  . atorvastatin  80 mg Oral q1800  . carvedilol  12.5 mg Oral BID WC  . insulin aspart  0-20 Units Subcutaneous TID WC  . irbesartan  300 mg Oral Daily  . linagliptin  5 mg Oral Daily   Continuous Infusions: . sodium chloride    . heparin 1,200 Units/hr (10/14/15 1100)   PRN Meds:.acetaminophen, albuterol, famotidine, fluticasone furoate-vilanterol, gi cocktail, hydrALAZINE, morphine injection, nitroGLYCERIN, ondansetron (ZOFRAN) IV, zolpidem   Antibiotics   Anti-infectives    None        Subjective:  Sandra Owens was seen and examined today. Feeling somewhat short of breath today. No chest pain.  Patient denies dizziness, abdominal pain, N/V/D/C, new weakness, numbess, tingling. No acute events overnight.    Objective:   Filed Vitals:   10/13/15 1619 10/13/15 1929 10/13/15 2348 10/14/15 0426  BP: 130/75 146/91 135/91 153/92  Pulse: 96 103 102 104  Temp: 98.6 F (37 C) 98.9 F (37.2 C) 99.2 F (37.3 C) 98.4 F (36.9 C)  TempSrc: Oral Oral Oral Oral  Resp: 18 18 18 18   Height:      Weight:    89.676 kg (197 lb 11.2 oz)  SpO2: 100% 100% 100% 96%    Intake/Output Summary (Last 24 hours) at 10/14/15 1200 Last data filed at 10/14/15 1100  Gross per 24 hour  Intake 2779.52 ml  Output   1600 ml  Net 1179.52 ml     Wt Readings from Last 3 Encounters:  10/14/15 89.676 kg (197 lb 11.2 oz)  11/23/14 84.823 kg (187 lb)  11/21/14  84.913 kg (187 lb 3.2 oz)     Exam  General: Alert and oriented x 3, NAD  HEENT:    Neck:   Cardiovascular: S1 S2 auscultated, no rubs, murmurs or gallops. Regular rate and rhythm.  Respiratory: Decreased breath sounds at the bases  Gastrointestinal: Soft, nontender, nondistended, + bowel sounds  Ext: no cyanosis clubbing or edema  Neuro no new deficits  Skin: No rashes  Psych: Normal affect and demeanor, alert and oriented x3    Data Reviewed:  I have personally reviewed following labs and imaging studies  Micro Results No results found for this or any previous visit (from the past 240 hour(s)).  Radiology Reports Dg Chest 2 View  10/12/2015  CLINICAL DATA:  Central chest pain, initial encounter EXAM: CHEST  2 VIEW COMPARISON:  10/24/2014 FINDINGS: Cardiac shadow is mildly enlarged. Mild central vascular congestion is noted without pulmonary edema. No sizable effusion or infiltrate is seen. No bony abnormality is noted. IMPRESSION: Mild vascular congestion without edema. Electronically Signed   By: Alcide Clever M.D.   On: 10/12/2015 16:34   Ct Angio Chest Pe W/cm &/or Wo Cm  10/12/2015  CLINICAL DATA:  Chest pain. Hypertension. Diabetes. Short of breath and bilateral leg pain for 1 week. Elevated creatinine. Systolic congestive heart failure. EXAM: CT ANGIOGRAPHY CHEST WITH CONTRAST TECHNIQUE: Multidetector CT imaging of the chest was performed using the standard protocol during bolus administration of intravenous contrast. Multiplanar CT image reconstructions and MIPs were obtained to evaluate the vascular anatomy. CONTRAST:  80 cc of Isovue 370 COMPARISON:  Chest radiograph of earlier today.  No prior CT. FINDINGS: Mediastinum/Nodes: The quality of this exam for evaluation of pulmonary embolism is moderate to good. Despite Position and patient body habitus degradation, the bolus is relatively well timed. No evidence of pulmonary embolism. The thoracic aorta is not well  opacified. No aneurysm identified. There is age advanced atherosclerosis. Moderate cardiomegaly. No mediastinal or hilar adenopathy. Lungs/Pleura: No pleural fluid. Mild degradation secondary to patient body habitus and arm position, not raised above the head. Clear lungs. Upper abdomen: Mild prominence the lateral segment left liver lobe. Possible subtle irregular hepatic capsule, including on image 90/series 4. Normal imaged portions of the spleen, stomach, pancreas, kidneys, gallbladder, right adrenal gland. Mild left adrenal thickening. Abdominal aortic atherosclerosis. Musculoskeletal: No acute osseous abnormality. Review of the MIP images confirms the above findings. IMPRESSION: 1. Mild degradation secondary to patient body habitus, arm position. No  pulmonary embolism. 2. Cardiomegaly with age advanced atherosclerosis. 3. Possible mild cirrhosis. Irregular hepatic capsule could alternatively be a CT artifact specific to iterative reconstruction. Correlate with risk factors. Electronically Signed   By: Jeronimo Greaves M.D.   On: 10/12/2015 20:04    Lab Data:  CBC:  Recent Labs Lab 10/12/15 1555 10/13/15 0523 10/14/15 0417  WBC 5.7 6.3 6.9  HGB 13.7 12.7 12.2  HCT 41.8 38.5 37.7  MCV 66.8* 66.4* 67.4*  PLT 270 233 251   Basic Metabolic Panel:  Recent Labs Lab 10/12/15 1555 10/13/15 0218 10/13/15 1050 10/14/15 0417 10/14/15 0951  NA 137  --  137 138 137  K 3.7  --  3.6 3.8 3.9  CL 104  --  108 106 108  CO2 24  --  23 24 23   GLUCOSE 249*  --  124* 157* 207*  BUN 15  --  13 15 14   CREATININE 1.42*  --  1.27* 1.33* 1.33*  CALCIUM 9.5  --  8.9 9.2 9.3  MG  --  1.9  --   --   --   PHOS  --  2.5  --   --   --    GFR: Estimated Creatinine Clearance: 55.4 mL/min (by C-G formula based on Cr of 1.33). Liver Function Tests:  Recent Labs Lab 10/12/15 1836  AST 18  ALT 13*  ALKPHOS 45  BILITOT 0.8  PROT 7.4  ALBUMIN 3.7    Recent Labs Lab 10/12/15 1836  LIPASE 30   No  results for input(s): AMMONIA in the last 168 hours. Coagulation Profile:  Recent Labs Lab 10/14/15 0417  INR 1.13   Cardiac Enzymes:  Recent Labs Lab 10/12/15 1836 10/12/15 2207 10/13/15 0218 10/13/15 0523 10/13/15 0754  TROPONINI 0.13* 3.72* 4.50* 3.92* 3.22*   BNP (last 3 results) No results for input(s): PROBNP in the last 8760 hours. HbA1C: No results for input(s): HGBA1C in the last 72 hours. CBG:  Recent Labs Lab 10/13/15 1110 10/13/15 1617 10/13/15 2131 10/14/15 0744 10/14/15 1122  GLUCAP 122* 195* 138* 165* 155*   Lipid Profile:  Recent Labs  10/14/15 0417  CHOL 199  HDL 38*  LDLCALC 137*  TRIG 122  CHOLHDL 5.2   Thyroid Function Tests: No results for input(s): TSH, T4TOTAL, FREET4, T3FREE, THYROIDAB in the last 72 hours. Anemia Panel: No results for input(s): VITAMINB12, FOLATE, FERRITIN, TIBC, IRON, RETICCTPCT in the last 72 hours. Urine analysis: No results found for: COLORURINE, APPEARANCEUR, LABSPEC, PHURINE, GLUCOSEU, HGBUR, BILIRUBINUR, KETONESUR, PROTEINUR, UROBILINOGEN, NITRITE, Hurshel Party M.D. [facility] 10/14/2015, 12:00 PM  Pager: [phone] Between 7am to 7pm - call Pager - [phone]  After 7pm go to [email or web address] - password [email or web address]  Call night coverage person covering after 7pm

## 2015-10-14 NOTE — Progress Notes (Signed)
Primary cardiologist:  Dr. Nona Dell   Subjective:    No chest pain.  She continues to feel short of breath.  Notes orthopnea.  No edema.  Objective:   Temp:  [98.4 F (36.9 C)-99.2 F (37.3 C)] 98.4 F (36.9 C) (06/11 0426) Pulse Rate:  [96-106] 104 (06/11 0426) Resp:  [18] 18 (06/11 0426) BP: (130-158)/(75-92) 153/92 mmHg (06/11 0426) SpO2:  [96 %-100 %] 96 % (06/11 0426) Weight:  [197 lb 11.2 oz (89.676 kg)] 197 lb 11.2 oz (89.676 kg) (06/11 0426) Last BM Date: 10/13/15  Filed Weights   10/12/15 2307 10/13/15 0103 10/14/15 0426  Weight: 194 lb 3.6 oz (88.1 kg) 193 lb (87.544 kg) 197 lb 11.2 oz (89.676 kg)    Intake/Output Summary (Last 24 hours) at 10/14/15 0934 Last data filed at 10/14/15 0600  Gross per 24 hour  Intake 2377.92 ml  Output   1600 ml  Net 777.92 ml    Telemetry:  NSR, sinus tachy  Exam:  General:  NAD  Neck: no JVD  Lungs:  CTA, no rales  Cardiac:  RRR,no murmur  Abdomen:  Soft, NT  MSK:  No edema  Neuro:  No focal deficits  Psych:  Normal affect    Lab Results:  Basic Metabolic Panel:  Recent Labs Lab 10/12/15 1555 10/13/15 0218 10/13/15 1050 10/14/15 0417  NA 137  --  137 138  K 3.7  --  3.6 3.8  CL 104  --  108 106  CO2 24  --  23 24  GLUCOSE 249*  --  124* 157*  BUN 15  --  13 15  CREATININE 1.42*  --  1.27* 1.33*  CALCIUM 9.5  --  8.9 9.2  MG  --  1.9  --   --     Liver Function Tests:  Recent Labs Lab 10/12/15 1836  AST 18  ALT 13*  ALKPHOS 45  BILITOT 0.8  PROT 7.4  ALBUMIN 3.7    CBC:  Recent Labs Lab 10/12/15 1555 10/13/15 0523 10/14/15 0417  WBC 5.7 6.3 6.9  HGB 13.7 12.7 12.2  HCT 41.8 38.5 37.7  MCV 66.8* 66.4* 67.4*  PLT 270 233 251    Cardiac Enzymes:  Recent Labs Lab 10/13/15 0218 10/13/15 0523 10/13/15 0754  TROPONINI 4.50* 3.92* 3.22*    BNP: No results for input(s): PROBNP in the last 8760 hours.  Coagulation: No results for input(s): INR in the last 168  hours.    Medications:   Scheduled Medications: . carvedilol  6.25 mg Oral BID WC  . insulin aspart  0-20 Units Subcutaneous TID WC  . irbesartan  300 mg Oral Daily  . linagliptin  5 mg Oral Daily    Infusions: . heparin 1,200 Units/hr (10/13/15 1800)    PRN Medications: acetaminophen, albuterol, famotidine, fluticasone furoate-vilanterol, gi cocktail, hydrALAZINE, morphine injection, nitroGLYCERIN, ondansetron (ZOFRAN) IV, zolpidem  Studies: Echocardiogram 10/13/15 - Left ventricle: The cavity size was moderately dilated. Wall  thickness was normal. Systolic function was severely reduced. The  estimated ejection fraction was in the range of 15% to 20%.  Diffuse hypokinesis. Doppler parameters are consistent with  restrictive physiology, indicative of decreased left ventricular  diastolic compliance and/or increased left atrial pressure.  Doppler parameters are consistent with high ventricular filling  pressure. - Aortic valve: There was trivial regurgitation. Valve area (VTI):  1.56 cm^2. Valve area (Vmax): 1.59 cm^2. Valve area (Vmean): 1.86  cm^2. - Mitral valve: There was mild regurgitation. -  Left atrium: The atrium was moderately dilated.   Assessment:  Sandra Owens is a 50 y.o. female with a history of systolic CHF, dilated cardiomyopathy with EF 25-30%, mild nonobstructive CAD by cardiac catheterization in 7/16, HTN, diabetes. She was admitted 6/9 with chest pain and elevated troponin consistent with non-STEMI. Chest CT was negative for pulmonary embolism. Echo demonstrates worsened LVF with EF 15-20%.  Plan is for cardiac cath tomorrow.   Problem List: Principal Problem:   NSTEMI (non-ST elevated myocardial infarction) (HCC) Active Problems:   Systolic CHF (HCC)   CAD (coronary artery disease)   Dilated cardiomyopathy (HCC)   Chest pain   Essential hypertension   Type 2 diabetes mellitus (HCC)   GERD (gastroesophageal reflux disease)      Plan/Discussion:    1. NSTEMI - No further chest pain. Peak troponin 4.50. Continue beta blocker with carvedilol, IV heparin. Start aspirin. Start Lipitor 80.  Plan cardiac cath tomorrow.  Risks and benefits of cardiac catheterization have been discussed with the patient.  These include bleeding, infection, kidney damage, stroke, heart attack, death.  The patient understands these risks and is willing to proceed.   2. Chronic systolic CHF - Secondary to non-ischemic CM.  Continue carvedilol, Avapro. Consider adding spironolactone after cardiac catheterization.    3. CAD - Non-obs CAD at [facility] In 2016.  Now presenting with NSTEMI.  Plan cath tomorrow.  Start ASA, statin.   4. HTN - Blood pressure remains above target. HR too fast with DCM/systolic HF. Will increase Coreg to 12.5 mg bid.  5. HL - Start high-dose statin therapy  6. CKD - Creatinine 1.42 >> 1.33 this admit.     Tereso Newcomer, PA-C   10/14/2015 9:34 AM Pager [phone]     Patient examined chart reviewed Chronic CHF previously presumed non ischemic Admitted with  CHF and chest pain with elevated troponin  Plan for right and left cath in am.  Risks discussed Willing to proceed  CR improved

## 2015-10-14 NOTE — Progress Notes (Signed)
At 0308 patient had a short burst of SVT. Strip saved in EPIC. Patient sleeping; asymptomatic. Will continue to monitor.

## 2015-10-14 NOTE — Progress Notes (Signed)
Paged L Harduk NP on call for [facility] because patient c/o slight shortness of breath and orthopnea. Lungs clear, diminished in the bases. Orders received to d/c patient's normal saline IV infusion. Will continue to closely monitor patient.

## 2015-10-14 NOTE — Plan of Care (Signed)
Problem: Consults Goal: Cardiac Cath Patient Education (See Patient Education module for education specifics.) Outcome: Completed/Met Date Met:  10/14/15 Cardiac catheterization with possible percutaneous coronary intervention discussed with pt. Pt has no further questions at this time. Does not desire watching the cardiac cath video states they saw it about a year ago. Consent obtained.

## 2015-10-14 NOTE — Progress Notes (Signed)
Pt had 14 bts NSVT on tele.  Pt sitting in bed with family asymptomatic with VSS. K 3.9 no mg results.  Dr. Donnie Aho notified with new orders received.  Will continue to monitor.

## 2015-10-14 NOTE — Progress Notes (Signed)
ANTICOAGULATION CONSULT NOTE Pharmacy Consult for Heparin Indication: chest pain/ACS  No Known Allergies  Patient Measurements: Height: 5\' 5"  (165.1 cm) Weight: 197 lb 11.2 oz (89.676 kg) IBW/kg (Calculated) : 57 Heparin Dosing Weight: 74 kg  Vital Signs: Temp: 98.4 F (36.9 C) (06/11 0426) Temp Source: Oral (06/11 0426) BP: 153/92 mmHg (06/11 0426) Pulse Rate: 104 (06/11 0426)  Labs:  Recent Labs  10/12/15 1555  10/13/15 0218  10/13/15 0523 10/13/15 0754 10/13/15 1050 10/13/15 1442 10/13/15 2122 10/14/15 0417  HGB 13.7  --   --   --  12.7  --   --   --   --  12.2  HCT 41.8  --   --   --  38.5  --   --   --   --  37.7  PLT 270  --   --   --  233  --   --   --   --  251  HEPARINUNFRC  --   --   --   < > <0.10*  --   --  0.55 0.49 0.46  CREATININE 1.42*  --   --   --   --   --  1.27*  --   --  1.33*  TROPONINI  --   < > 4.50*  --  3.92* 3.22*  --   --   --   --   < > = values in this interval not displayed.  Estimated Creatinine Clearance: 55.4 mL/min (by C-G formula based on Cr of 1.33).  Assessment: 50 y.o. female with chest pain, continuing on heparin. CT Angio negative for PE. CBC wnl, no bleed documented.  Heparin level remains therapeutic on 1200 units/hr.  CBC stable. No bleeding noted.  EF 15-20% from ECHO 6/10.   Goal of Therapy:  Heparin level 0.3-0.7 units/ml Monitor platelets by anticoagulation protocol: Yes   Plan:  Continue Heparin at 1200 units/hr Follow up AM labs Follow up post CATH 6/12  Link Snuffer, PharmD, BCPS Clinical Pharmacist [phone]  10/14/2015 9:31 AM

## 2015-10-15 ENCOUNTER — Encounter (HOSPITAL_COMMUNITY)
Admission: EM | Disposition: A | Discharge status: Home Health | Payer: Self-pay | Source: Home / Self Care | Attending: Internal Medicine

## 2015-10-15 ENCOUNTER — Encounter (HOSPITAL_COMMUNITY): Payer: Self-pay | Admitting: *Deleted

## 2015-10-15 DIAGNOSIS — I25110 Atherosclerotic heart disease of native coronary artery with unstable angina pectoris: Secondary | ICD-10-CM | POA: Insufficient documentation

## 2015-10-15 DIAGNOSIS — I429 Cardiomyopathy, unspecified: Secondary | ICD-10-CM

## 2015-10-15 DIAGNOSIS — I5041 Acute combined systolic (congestive) and diastolic (congestive) heart failure: Secondary | ICD-10-CM

## 2015-10-15 DIAGNOSIS — I472 Ventricular tachycardia: Secondary | ICD-10-CM

## 2015-10-15 DIAGNOSIS — I5023 Acute on chronic systolic (congestive) heart failure: Secondary | ICD-10-CM | POA: Insufficient documentation

## 2015-10-15 DIAGNOSIS — I5022 Chronic systolic (congestive) heart failure: Secondary | ICD-10-CM

## 2015-10-15 DIAGNOSIS — I2510 Atherosclerotic heart disease of native coronary artery without angina pectoris: Secondary | ICD-10-CM

## 2015-10-15 HISTORY — PX: CARDIAC CATHETERIZATION: SHX172

## 2015-10-15 LAB — GLUCOSE, CAPILLARY
GLUCOSE-CAPILLARY: 160 mg/dL — AB (ref 65–99)
GLUCOSE-CAPILLARY: 144 mg/dL — AB (ref 65–99)
GLUCOSE-CAPILLARY: 179 mg/dL — AB (ref 65–99)
Glucose-Capillary: 141 mg/dL — ABNORMAL HIGH (ref 65–99)

## 2015-10-15 LAB — POCT I-STAT 3, ART BLOOD GAS (G3+)
Acid-base deficit: 3 mmol/L — ABNORMAL HIGH (ref 0.0–2.0)
Bicarbonate: 22.7 mEq/L (ref 20.0–24.0)
O2 SAT: 86 %
PCO2 ART: 41 mmHg (ref 35.0–45.0)
TCO2: 24 mmol/L (ref 0–100)
pH, Arterial: 7.35 (ref 7.350–7.450)
pO2, Arterial: 54 mmHg — ABNORMAL LOW (ref 80.0–100.0)

## 2015-10-15 LAB — BASIC METABOLIC PANEL
ANION GAP: 8 (ref 5–15)
BUN: 17 mg/dL (ref 6–20)
CO2: 23 mmol/L (ref 22–32)
Calcium: 9.1 mg/dL (ref 8.9–10.3)
Chloride: 108 mmol/L (ref 101–111)
Creatinine, Ser: 1.25 mg/dL — ABNORMAL HIGH (ref 0.44–1.00)
GFR calc Af Amer: 57 mL/min — ABNORMAL LOW (ref >60)
GFR calc non Af Amer: 49 mL/min — ABNORMAL LOW (ref >60)
GLUCOSE: 139 mg/dL — AB (ref 65–99)
POTASSIUM: 3.9 mmol/L (ref 3.5–5.1)
Sodium: 139 mmol/L (ref 135–145)

## 2015-10-15 LAB — POCT I-STAT 3, VENOUS BLOOD GAS (G3P V)
ACID-BASE DEFICIT: 2 mmol/L (ref 0.0–2.0)
Acid-base deficit: 3 mmol/L — ABNORMAL HIGH (ref 0.0–2.0)
BICARBONATE: 25.3 meq/L — AB (ref 20.0–24.0)
Bicarbonate: 24 mEq/L (ref 20.0–24.0)
O2 Saturation: 36 %
O2 Saturation: 37 %
PH VEN: 7.276 (ref 7.250–7.300)
TCO2: 26 mmol/L (ref 0–100)
TCO2: 27 mmol/L (ref 0–100)
pCO2, Ven: 52.3 mmHg — ABNORMAL HIGH (ref 45.0–50.0)
pCO2, Ven: 54.3 mmHg — ABNORMAL HIGH (ref 45.0–50.0)
pH, Ven: 7.271 (ref 7.250–7.300)
pO2, Ven: 25 mmHg — ABNORMAL LOW (ref 31.0–45.0)
pO2, Ven: 25 mmHg — ABNORMAL LOW (ref 31.0–45.0)

## 2015-10-15 LAB — CBC
HCT: 37.2 % (ref 36.0–46.0)
HEMOGLOBIN: 12 g/dL (ref 12.0–15.0)
MCH: 21.4 pg — AB (ref 26.0–34.0)
MCHC: 32.3 g/dL (ref 30.0–36.0)
MCV: 66.4 fL — AB (ref 78.0–100.0)
PLATELETS: 234 10*3/uL (ref 150–400)
RBC: 5.6 MIL/uL — AB (ref 3.87–5.11)
RDW: 14.9 % (ref 11.5–15.5)
WBC: 5.7 10*3/uL (ref 4.0–10.5)

## 2015-10-15 LAB — HEMOGLOBIN A1C
HEMOGLOBIN A1C: 7.8 % — AB (ref 4.8–5.6)
MEAN PLASMA GLUCOSE: 177 mg/dL

## 2015-10-15 LAB — POCT ACTIVATED CLOTTING TIME: ACTIVATED CLOTTING TIME: 197 s

## 2015-10-15 LAB — MAGNESIUM: Magnesium: 1.8 mg/dL (ref 1.7–2.4)

## 2015-10-15 LAB — HEPARIN LEVEL (UNFRACTIONATED): Heparin Unfractionated: 0.48 IU/mL (ref 0.30–0.70)

## 2015-10-15 SURGERY — RIGHT/LEFT HEART CATH AND CORONARY ANGIOGRAPHY
Anesthesia: LOCAL

## 2015-10-15 MED ORDER — DIGOXIN 125 MCG PO TABS
0.1250 mg | ORAL_TABLET | ORAL | Status: DC
  Administered 2015-10-15: 0.125 mg via ORAL

## 2015-10-15 MED ORDER — MIDAZOLAM HCL 2 MG/2ML IJ SOLN
INTRAMUSCULAR | Status: DC | PRN
  Administered 2015-10-15 (×2): 1 mg via INTRAVENOUS

## 2015-10-15 MED ORDER — CLOPIDOGREL BISULFATE 75 MG PO TABS
75.0000 mg | ORAL_TABLET | Freq: Every day | ORAL | Status: DC
  Administered 2015-10-16 – 2015-10-17 (×2): 75 mg via ORAL
  Filled 2015-10-15 (×2): qty 1

## 2015-10-15 MED ORDER — FUROSEMIDE 10 MG/ML IJ SOLN
80.0000 mg | Freq: Two times a day (BID) | INTRAMUSCULAR | Status: DC
  Administered 2015-10-16 (×2): 80 mg via INTRAVENOUS
  Filled 2015-10-15 (×2): qty 8

## 2015-10-15 MED ORDER — HEPARIN (PORCINE) IN NACL 100-0.45 UNIT/ML-% IJ SOLN
1300.0000 [IU]/h | INTRAMUSCULAR | Status: DC
  Administered 2015-10-15: 1200 [IU]/h via INTRAVENOUS
  Administered 2015-10-16: 1300 [IU]/h via INTRAVENOUS
  Filled 2015-10-15 (×3): qty 250

## 2015-10-15 MED ORDER — SACUBITRIL-VALSARTAN 24-26 MG PO TABS
1.0000 | ORAL_TABLET | Freq: Two times a day (BID) | ORAL | Status: DC
Start: 2015-10-15 — End: 2015-10-15
  Filled 2015-10-15: qty 1

## 2015-10-15 MED ORDER — IOPAMIDOL (ISOVUE-370) INJECTION 76%
INTRAVENOUS | Status: AC
  Filled 2015-10-15: qty 100

## 2015-10-15 MED ORDER — ASPIRIN 81 MG PO CHEW
81.0000 mg | CHEWABLE_TABLET | Freq: Every day | ORAL | Status: DC
  Administered 2015-10-15 – 2015-10-19 (×5): 81 mg via ORAL
  Filled 2015-10-15 (×4): qty 1

## 2015-10-15 MED ORDER — VERAPAMIL HCL 2.5 MG/ML IV SOLN
INTRAVENOUS | Status: DC | PRN
  Administered 2015-10-15: 10 mL via INTRA_ARTERIAL

## 2015-10-15 MED ORDER — FUROSEMIDE 10 MG/ML IJ SOLN
INTRAMUSCULAR | Status: AC
  Filled 2015-10-15: qty 4

## 2015-10-15 MED ORDER — SODIUM CHLORIDE 0.9% FLUSH
3.0000 mL | Freq: Two times a day (BID) | INTRAVENOUS | Status: DC
  Administered 2015-10-17: 3 mL via INTRAVENOUS

## 2015-10-15 MED ORDER — LIDOCAINE HCL (PF) 1 % IJ SOLN
INTRAMUSCULAR | Status: AC
  Filled 2015-10-15: qty 30

## 2015-10-15 MED ORDER — ONDANSETRON HCL 4 MG/2ML IJ SOLN: 4.0000 mg | Freq: Four times a day (QID) | INTRAMUSCULAR | Status: DC | PRN

## 2015-10-15 MED ORDER — DIGOXIN 125 MCG PO TABS
0.1250 mg | ORAL_TABLET | Freq: Every day | ORAL | Status: DC
  Filled 2015-10-15: qty 1

## 2015-10-15 MED ORDER — ACETAMINOPHEN 325 MG PO TABS
650.0000 mg | ORAL_TABLET | ORAL | Status: DC | PRN
  Administered 2015-10-16: 650 mg via ORAL
  Filled 2015-10-15: qty 2

## 2015-10-15 MED ORDER — SODIUM CHLORIDE 0.9 % IV SOLN: 250.0000 mL | INTRAVENOUS | Status: DC | PRN

## 2015-10-15 MED ORDER — LIDOCAINE HCL (PF) 1 % IJ SOLN
INTRAMUSCULAR | Status: DC | PRN
  Administered 2015-10-15: 5 mL

## 2015-10-15 MED ORDER — HEPARIN SODIUM (PORCINE) 1000 UNIT/ML IJ SOLN
INTRAMUSCULAR | Status: DC | PRN
  Administered 2015-10-15: 4500 [IU] via INTRAVENOUS

## 2015-10-15 MED ORDER — SPIRONOLACTONE 25 MG PO TABS
12.5000 mg | ORAL_TABLET | Freq: Every day | ORAL | Status: DC
  Administered 2015-10-15 – 2015-10-19 (×5): 12.5 mg via ORAL
  Filled 2015-10-15 (×5): qty 1

## 2015-10-15 MED ORDER — HEPARIN (PORCINE) IN NACL 2-0.9 UNIT/ML-% IJ SOLN
INTRAMUSCULAR | Status: AC
  Filled 2015-10-15: qty 1500

## 2015-10-15 MED ORDER — FUROSEMIDE 10 MG/ML IJ SOLN
40.0000 mg | Freq: Four times a day (QID) | INTRAMUSCULAR | Status: DC
  Administered 2015-10-15 (×2): 40 mg via INTRAVENOUS
  Filled 2015-10-15: qty 4

## 2015-10-15 MED ORDER — HEPARIN SODIUM (PORCINE) 1000 UNIT/ML IJ SOLN
INTRAMUSCULAR | Status: AC
  Filled 2015-10-15: qty 1

## 2015-10-15 MED ORDER — FENTANYL CITRATE (PF) 100 MCG/2ML IJ SOLN
INTRAMUSCULAR | Status: AC
  Filled 2015-10-15: qty 2

## 2015-10-15 MED ORDER — SODIUM CHLORIDE 0.9% FLUSH: 3.0000 mL | INTRAVENOUS | Status: DC | PRN

## 2015-10-15 MED ORDER — NITROGLYCERIN IN D5W 200-5 MCG/ML-% IV SOLN
INTRAVENOUS | Status: AC
  Filled 2015-10-15: qty 250

## 2015-10-15 MED ORDER — CARVEDILOL 3.125 MG PO TABS: 3.1250 mg | ORAL_TABLET | Freq: Two times a day (BID) | ORAL | Status: DC

## 2015-10-15 MED ORDER — MIDAZOLAM HCL 2 MG/2ML IJ SOLN
INTRAMUSCULAR | Status: AC
  Filled 2015-10-15: qty 2

## 2015-10-15 MED ORDER — VERAPAMIL HCL 2.5 MG/ML IV SOLN
INTRAVENOUS | Status: AC
  Filled 2015-10-15: qty 2

## 2015-10-15 MED ORDER — SODIUM CHLORIDE 0.9 % WEIGHT BASED INFUSION
1.0000 mL/kg/h | INTRAVENOUS | Status: AC
  Administered 2015-10-15: 1 mL/kg/h via INTRAVENOUS

## 2015-10-15 MED ORDER — NITROGLYCERIN IN D5W 200-5 MCG/ML-% IV SOLN
0.0000 ug/min | INTRAVENOUS | Status: DC
  Administered 2015-10-15: 20 ug/min via INTRAVENOUS

## 2015-10-15 MED ORDER — NITROGLYCERIN IN D5W 200-5 MCG/ML-% IV SOLN
INTRAVENOUS | Status: DC | PRN
  Administered 2015-10-15: 20 ug/min via INTRAVENOUS

## 2015-10-15 MED ORDER — SACUBITRIL-VALSARTAN 24-26 MG PO TABS
1.0000 | ORAL_TABLET | Freq: Two times a day (BID) | ORAL | Status: DC
  Administered 2015-10-16 – 2015-10-19 (×7): 1 via ORAL
  Filled 2015-10-15 (×7): qty 1

## 2015-10-15 MED ORDER — FENTANYL CITRATE (PF) 100 MCG/2ML IJ SOLN
INTRAMUSCULAR | Status: DC | PRN
  Administered 2015-10-15: 50 ug via INTRAVENOUS

## 2015-10-15 MED ORDER — IOPAMIDOL (ISOVUE-370) INJECTION 76%
INTRAVENOUS | Status: DC | PRN
  Administered 2015-10-15: 85 mL via INTRA_ARTERIAL

## 2015-10-15 SURGICAL SUPPLY — 12 items
CATH BALLN WEDGE 5F 110CM (CATHETERS) ×2 IMPLANT
CATH INFINITI 5 FR JL3.5 (CATHETERS) ×2 IMPLANT
CATH INFINITI JR4 5F (CATHETERS) ×2 IMPLANT
DEVICE RAD COMP TR BAND LRG (VASCULAR PRODUCTS) ×2 IMPLANT
GLIDESHEATH SLEND A-KIT 6F 22G (SHEATH) ×2 IMPLANT
KIT HEART LEFT (KITS) ×2 IMPLANT
PACK CARDIAC CATHETERIZATION (CUSTOM PROCEDURE TRAY) ×2 IMPLANT
SHEATH FAST CATH BRACH 5F 5CM (SHEATH) ×2 IMPLANT
TRANSDUCER W/STOPCOCK (MISCELLANEOUS) ×2 IMPLANT
TUBING CIL FLEX 10 FLL-RA (TUBING) ×2 IMPLANT
WIRE EMERALD 3MM-J .025X260CM (WIRE) ×2 IMPLANT
WIRE SAFE-T 1.5MM-J .035X260CM (WIRE) ×2 IMPLANT

## 2015-10-15 NOTE — Progress Notes (Signed)
ANTICOAGULATION CONSULT NOTE Pharmacy Consult for Heparin Indication: chest pain/ACS  No Known Allergies  Patient Measurements: Height: 5\' 5"  (165.1 cm) Weight: 198 lb 10.2 oz (90.1 kg) IBW/kg (Calculated) : 57 Heparin Dosing Weight: 74 kg  Vital Signs: Temp: 98.5 F (36.9 C) (06/12 1350) Temp Source: Oral (06/12 1350) BP: 124/77 mmHg (06/12 1350) Pulse Rate: 90 (06/12 1350)  Labs:  Recent Labs  10/13/15 0218 10/13/15 0523 10/13/15 0754  10/13/15 2122 10/14/15 0417 10/14/15 0951 10/15/15 0453  HGB  --  12.7  --   --   --  12.2  --  12.0  HCT  --  38.5  --   --   --  37.7  --  37.2  PLT  --  233  --   --   --  251  --  234  LABPROT  --   --   --   --   --  14.7  --   --   INR  --   --   --   --   --  1.13  --   --   HEPARINUNFRC  --  <0.10*  --   < > 0.49 0.46  --  0.48  CREATININE  --   --   --   < >  --  1.33* 1.33* 1.25*  TROPONINI 4.50* 3.92* 3.22*  --   --   --   --   --   < > = values in this interval not displayed.  Estimated Creatinine Clearance: 59 mL/min (by C-G formula based on Cr of 1.25).  Assessment: 50 y.o. female with chest pain, continuing on heparin. CT Angio negative for PE. CBC wnl, no bleed documented.  Heparin level (0.48) remains therapeutic on 1200 units/hr.  CBC stable. No bleeding noted.  EF 15-20% from ECHO 6/10.  Cath today revealed 95% LAD stenosis, for pci once HF is stable.  Orders to resume heparin tonight, will restart at previous rate.  Goal of Therapy:  Heparin level 0.3-0.7 units/ml Monitor platelets by anticoagulation protocol: Yes   Plan:  Restart Heparin at 1200 units/hr Follow up am labs  Sheppard Coil PharmD., BCPS Clinical Pharmacist Pager [phone] 10/15/2015 3:29 PM

## 2015-10-15 NOTE — Progress Notes (Signed)
Site area: Arts development officer Prior to Removal:  Level  0 Pressure Applied For: 10 minutes Manual:  yes  Patient Status During Pull:  awake Post Pull Site:  Level 0 Post Pull Instructions Given:   yes Post Pull Pulses Present:  Dressing Applied:  yes Bedrest begins @ 13:20 Comments:

## 2015-10-15 NOTE — Progress Notes (Signed)
[facility]                                                                              Patient Demographics  Sandra Owens, is a 50 y.o. female, DOB - [birth year], BUL:[id number]  Admit date - 10/12/2015   Admitting Physician Haydee Salter, MD  Outpatient Primary MD for the patient is Allean Found, MD  Outpatient specialists:   LOS - 2  days    Chief Complaint  Patient presents with  . Chest Pain       Brief summary   Sandra Owens is a 50 y.o. female With hypertension, diabetes, GERD presented to ED with chest pain. Patient reported that she woke up at 7 AM on the morning of admission and thought she had indigestion however that her discomfort and to new throughout the day with no relief of symptoms. Hence presented to ED.  Troponin was elevated initially at 0.13 subsequently 3.72. Patient was admitted for further workup and cardiology was consulted. She was started on heparin drip. CT angiogram of the chest showed no pulmonary embolism.  Assessment & Plan    Principal Problem:   NSTEMI (non-ST elevated myocardial infarction) (HCC) - She had nonobstructive coronary artery disease in the past, currently with positive troponins on heparin drip. - Continue aspirin, beta blocker, ARB - 2-D echo showed EF of 15-20% with diffuse hypokinesis - Cardiac cath today - Complaining of shortness of breath, awaiting cardiac cath today, await further plans   Acute kidney injury: Baseline Cr nl, Cr on admit 1.42 - Creatinine stable at 1.3, discontinued HCTZ   Essential HTN - Continue beta blocker, ARB, HCTZ discontinued - IV hydralazine as needed  GERD - Continue Pepcid  DM II Tradjenta 5 mg daily, replacing Januvia Sliding scale insulin, placed on NovoLog meal coverage  Low Back Pain Tylenol 650 mg every 4 hours prn  Nonischemic Cardiomyopathy  echo shows EF of 15-20% with diffuse hypokinesis, continue beta blocker, ARB Given her low EF,  may need ICD in future, will defer to cardiology  COPD Breo prn qd, Albuterol 1-2 puffs every 4 hours when necessary wheezing or shortness of breath  Code Status: Full code  DVT Prophylaxis:   heparin   Family Communication: Discussed in detail with the patient, all imaging results, lab results explained to the patient and sister at the bedside   Disposition Plan:    Time Spent in minutes   25 minutes  Procedures:    Consultants:   Cardiology  Antimicrobials:      Medications  Scheduled Meds: . aspirin EC  81 mg Oral Daily  . atorvastatin  80 mg Oral q1800  . carvedilol  12.5 mg Oral BID WC  . [MAR Hold] insulin aspart  0-20 Units Subcutaneous TID WC  . [MAR Hold] insulin aspart  3 Units Subcutaneous TID WC  . [MAR Hold] irbesartan  300 mg Oral Daily  . [MAR Hold] linagliptin  5 mg Oral Daily  . [MAR Hold] nicotine  14 mg Transdermal Daily   Continuous Infusions: . sodium chloride 75 mL/hr at 10/15/15 1045  . heparin Stopped (10/15/15 1045)  PRN Meds:.[MAR Hold] acetaminophen, [MAR Hold] albuterol, [MAR Hold] famotidine, [MAR Hold] fluticasone furoate-vilanterol, [MAR Hold] gi cocktail, [MAR Hold] hydrALAZINE, [MAR Hold]  morphine injection, [MAR Hold] nitroGLYCERIN, [MAR Hold] ondansetron (ZOFRAN) IV, [MAR Hold] zolpidem   Antibiotics   Anti-infectives    None        Subjective:   Sandra Owens was seen and examined today. Continues to feel short of breath. No chest pain.  Patient denied dizziness, abdominal pain, N/V/D/C, new weakness, numbess, tingling. No acute events overnight.    Objective:   Filed Vitals:   10/14/15 1515 10/14/15 2045 10/15/15 0500 10/15/15 1128  BP: 151/89 126/69 122/74   Pulse: 106 95 96   Temp: 98.6 F (37 C) 98.5 F (36.9 C) 98.7 F (37.1 C)   TempSrc: Oral Oral Oral   Resp: 18     Height:      Weight:   89.631 kg (197 lb 9.6 oz)   SpO2: 100% 99% 100% 100%    Intake/Output Summary (Last 24 hours) at  10/15/15 1129 Last data filed at 10/15/15 1045  Gross per 24 hour  Intake 1683.75 ml  Output      0 ml  Net 1683.75 ml     Wt Readings from Last 3 Encounters:  10/15/15 89.631 kg (197 lb 9.6 oz)  11/23/14 84.823 kg (187 lb)  11/21/14 84.913 kg (187 lb 3.2 oz)     Exam  General: Alert and oriented x 3, NAD  HEENT:    Neck:   Cardiovascular: S1 S2 auscultated, no rubs, murmurs or gallops. Regular rate and rhythm.  Respiratory: Decreased breath sounds at the bases  Gastrointestinal: Soft, nontender, nondistended, + bowel sounds  Ext: no cyanosis clubbing or edema  Neuro no new deficits  Skin: No rashes  Psych: Normal affect and demeanor, alert and oriented x3    Data Reviewed:  I have personally reviewed following labs and imaging studies  Micro Results No results found for this or any previous visit (from the past 240 hour(s)).  Radiology Reports Dg Chest 2 View  10/12/2015  CLINICAL DATA:  Central chest pain, initial encounter EXAM: CHEST  2 VIEW COMPARISON:  10/24/2014 FINDINGS: Cardiac shadow is mildly enlarged. Mild central vascular congestion is noted without pulmonary edema. No sizable effusion or infiltrate is seen. No bony abnormality is noted. IMPRESSION: Mild vascular congestion without edema. Electronically Signed   By: Alcide Clever M.D.   On: 10/12/2015 16:34   Ct Angio Chest Pe W/cm &/or Wo Cm  10/12/2015  CLINICAL DATA:  Chest pain. Hypertension. Diabetes. Short of breath and bilateral leg pain for 1 week. Elevated creatinine. Systolic congestive heart failure. EXAM: CT ANGIOGRAPHY CHEST WITH CONTRAST TECHNIQUE: Multidetector CT imaging of the chest was performed using the standard protocol during bolus administration of intravenous contrast. Multiplanar CT image reconstructions and MIPs were obtained to evaluate the vascular anatomy. CONTRAST:  80 cc of Isovue 370 COMPARISON:  Chest radiograph of earlier today.  No prior CT. FINDINGS: Mediastinum/Nodes: The  quality of this exam for evaluation of pulmonary embolism is moderate to good. Despite Position and patient body habitus degradation, the bolus is relatively well timed. No evidence of pulmonary embolism. The thoracic aorta is not well opacified. No aneurysm identified. There is age advanced atherosclerosis. Moderate cardiomegaly. No mediastinal or hilar adenopathy. Lungs/Pleura: No pleural fluid. Mild degradation secondary to patient body habitus and arm position, not raised above the head. Clear lungs. Upper abdomen: Mild prominence the lateral segment left liver  lobe. Possible subtle irregular hepatic capsule, including on image 90/series 4. Normal imaged portions of the spleen, stomach, pancreas, kidneys, gallbladder, right adrenal gland. Mild left adrenal thickening. Abdominal aortic atherosclerosis. Musculoskeletal: No acute osseous abnormality. Review of the MIP images confirms the above findings. IMPRESSION: 1. Mild degradation secondary to patient body habitus, arm position. No pulmonary embolism. 2. Cardiomegaly with age advanced atherosclerosis. 3. Possible mild cirrhosis. Irregular hepatic capsule could alternatively be a CT artifact specific to iterative reconstruction. Correlate with risk factors. Electronically Signed   By: Jeronimo Greaves M.D.   On: 10/12/2015 20:04    Lab Data:  CBC:  Recent Labs Lab 10/12/15 1555 10/13/15 0523 10/14/15 0417 10/15/15 0453  WBC 5.7 6.3 6.9 5.7  HGB 13.7 12.7 12.2 12.0  HCT 41.8 38.5 37.7 37.2  MCV 66.8* 66.4* 67.4* 66.4*  PLT 270 233 251 234   Basic Metabolic Panel:  Recent Labs Lab 10/12/15 1555 10/13/15 0218 10/13/15 1050 10/14/15 0417 10/14/15 0951 10/14/15 2339 10/15/15 0453  NA 137  --  137 138 137  --  139  K 3.7  --  3.6 3.8 3.9  --  3.9  CL 104  --  108 106 108  --  108  CO2 24  --  23 24 23   --  23  GLUCOSE 249*  --  124* 157* 207*  --  139*  BUN 15  --  13 15 14   --  17  CREATININE 1.42*  --  1.27* 1.33* 1.33*  --  1.25*    CALCIUM 9.5  --  8.9 9.2 9.3  --  9.1  MG  --  1.9  --   --   --  1.8  --   PHOS  --  2.5  --   --   --   --   --    GFR: Estimated Creatinine Clearance: 58.8 mL/min (by C-G formula based on Cr of 1.25). Liver Function Tests:  Recent Labs Lab 10/12/15 1836  AST 18  ALT 13*  ALKPHOS 45  BILITOT 0.8  PROT 7.4  ALBUMIN 3.7    Recent Labs Lab 10/12/15 1836  LIPASE 30   No results for input(s): AMMONIA in the last 168 hours. Coagulation Profile:  Recent Labs Lab 10/14/15 0417  INR 1.13   Cardiac Enzymes:  Recent Labs Lab 10/12/15 1836 10/12/15 2207 10/13/15 0218 10/13/15 0523 10/13/15 0754  TROPONINI 0.13* 3.72* 4.50* 3.92* 3.22*   BNP (last 3 results) No results for input(s): PROBNP in the last 8760 hours. HbA1C:  Recent Labs  10/13/15 0218  HGBA1C 7.8*   CBG:  Recent Labs Lab 10/14/15 0744 10/14/15 1122 10/14/15 1617 10/14/15 2216 10/15/15 0728  GLUCAP 165* 155* 119* 165* 160*   Lipid Profile:  Recent Labs  10/14/15 0417  CHOL 199  HDL 38*  LDLCALC 137*  TRIG 122  CHOLHDL 5.2   Thyroid Function Tests: No results for input(s): TSH, T4TOTAL, FREET4, T3FREE, THYROIDAB in the last 72 hours. Anemia Panel: No results for input(s): VITAMINB12, FOLATE, FERRITIN, TIBC, IRON, RETICCTPCT in the last 72 hours. Urine analysis: No results found for: COLORURINE, APPEARANCEUR, LABSPEC, PHURINE, GLUCOSEU, HGBUR, BILIRUBINUR, KETONESUR, PROTEINUR, UROBILINOGEN, NITRITE, Hurshel Party M.D. [facility] 10/15/2015, 11:29 AM  Pager: [phone] Between 7am to 7pm - call Pager - [phone]  After 7pm go to [email or web address] - password [email or web address]  Call night coverage person covering after 7pm

## 2015-10-15 NOTE — Progress Notes (Signed)
.    ADVANCED HF PROGRESS NOTE  Patient seen and examined.   Cath and echo images reviewed personally.   She has severe systolic HF with markedly elevated filling pressures and low cardiac output. However on exam looks fairly well compensated. She has also been found to have high-grade LAD lesion and is pending PCI once she is better tuned up. HF preceded development of CAD and cardiomyopathy is likely mostly non-ischemic in nature.   She has h/o noncompliance as well as ongoing tobacco use and probable undiagnosed OSA. We discussed the need for aggressive RF management after discharge.  AHF team will assume care.   Place PICC Start Entresto, digoxin, spiro.  Will follow co-ox and CVP and decide on need for inotropic support.  Time spent 35 minutes discussing with patient and family.   Yukiko Minnich,MD 8:36 PM

## 2015-10-15 NOTE — Interval H&P Note (Signed)
History and Physical Interval Note:  10/15/2015 11:38 AM  Sandra Owens  has presented today for surgery, with the diagnosis of unstable angina  The various methods of treatment have been discussed with the patient and family. After consideration of risks, benefits and other options for treatment, the patient has consented to  Procedure(s): Right/Left Heart Cath and Coronary Angiography (N/A) as a surgical intervention .  The patient's history has been reviewed, patient examined, no change in status, stable for surgery.  I have reviewed the patient's chart and labs.  Questions were answered to the patient's satisfaction.     [name]

## 2015-10-15 NOTE — Progress Notes (Signed)
  SUBJECTIVE:  Her biggest complaint is SOB.  She is not having any chest pain.    PHYSICAL EXAM Filed Vitals:   10/14/15 0426 10/14/15 1515 10/14/15 2045 10/15/15 0500  BP: 153/92 151/89 126/69 122/74  Pulse: 104 106 95 96  Temp: 98.4 F (36.9 C) 98.6 F (37 C) 98.5 F (36.9 C) 98.7 F (37.1 C)  TempSrc: Oral Oral Oral Oral  Resp: 18 18    Height:      Weight: 197 lb 11.2 oz (89.676 kg)   197 lb 9.6 oz (89.631 kg)  SpO2: 96% 100% 99% 100%   General:  No distress Lungs:  Clear Heart:  RRR Abdomen:  Positive bowel sounds, no rebound no guarding Extremities:  No edema   LABS: Lab Results  Component Value Date   TROPONINI 3.22* 10/13/2015   Results for orders placed or performed during the hospital encounter of 10/12/15 (from the past 24 hour(s))  Glucose, capillary     Status: Abnormal   Collection Time: 10/14/15 11:22 AM  Result Value Ref Range   Glucose-Capillary 155 (H) 65 - 99 mg/dL  Glucose, capillary     Status: Abnormal   Collection Time: 10/14/15  4:17 PM  Result Value Ref Range   Glucose-Capillary 119 (H) 65 - 99 mg/dL  Glucose, capillary     Status: Abnormal   Collection Time: 10/14/15 10:16 PM  Result Value Ref Range   Glucose-Capillary 165 (H) 65 - 99 mg/dL  Magnesium     Status: None   Collection Time: 10/14/15 11:39 PM  Result Value Ref Range   Magnesium 1.8 1.7 - 2.4 mg/dL  CBC     Status: Abnormal   Collection Time: 10/15/15  4:53 AM  Result Value Ref Range   WBC 5.7 4.0 - 10.5 K/uL   RBC 5.60 (H) 3.87 - 5.11 MIL/uL   Hemoglobin 12.0 12.0 - 15.0 g/dL   HCT 37.2 36.0 - 46.0 %   MCV 66.4 (L) 78.0 - 100.0 fL   MCH 21.4 (L) 26.0 - 34.0 pg   MCHC 32.3 30.0 - 36.0 g/dL   RDW 14.9 11.5 - 15.5 %   Platelets 234 150 - 400 K/uL  Heparin level (unfractionated)     Status: None   Collection Time: 10/15/15  4:53 AM  Result Value Ref Range   Heparin Unfractionated 0.48 0.30 - 0.70 IU/mL  Basic metabolic panel     Status: Abnormal   Collection Time:  10/15/15  4:53 AM  Result Value Ref Range   Sodium 139 135 - 145 mmol/L   Potassium 3.9 3.5 - 5.1 mmol/L   Chloride 108 101 - 111 mmol/L   CO2 23 22 - 32 mmol/L   Glucose, Bld 139 (H) 65 - 99 mg/dL   BUN 17 6 - 20 mg/dL   Creatinine, Ser 1.25 (H) 0.44 - 1.00 mg/dL   Calcium 9.1 8.9 - 10.3 mg/dL   GFR calc non Af Amer 49 (L) >60 mL/min   GFR calc Af Amer 57 (L) >60 mL/min   Anion gap 8 5 - 15  Glucose, capillary     Status: Abnormal   Collection Time: 10/15/15  7:28 AM  Result Value Ref Range   Glucose-Capillary 160 (H) 65 - 99 mg/dL   Comment 1 Notify RN    Comment 2 Document in Chart     Intake/Output Summary (Last 24 hours) at 10/15/15 1043 Last data filed at 10/15/15 0838  Gross per 24 hour  Intake   1868.7 ml  Output      0 ml  Net 1868.7 ml    ASSESSMENT AND PLAN:  NSTEMI:  Cath today.    CARDIOMYOPATHY:  She has increased SOB.  I/O not complete.    Weight is essentially unchanged.  I think that she has decompensated HF.  I will ask for a right heart cath.   She will likely need increased diuresis after this.  Ultimately, since she needs a cardiologist in [place], she should follow in the HF clinic   HTN:     This is being managed in the context of treating his CHF.  Will continue to titrate diuretic and consider adding spironolactone.  Can switch to [facility] as an outpatient.    NSVT:   Runs of NSVT.  I will give a little magnesium.  Continue to titrate beta blocker and other meds.  She will likely need an ICD in the future.   Fayrene Fearing [facility] 10/15/2015 10:43 AM

## 2015-10-15 NOTE — Progress Notes (Signed)
ANTICOAGULATION CONSULT NOTE Pharmacy Consult for Heparin Indication: chest pain/ACS  No Known Allergies  Patient Measurements: Height: 5\' 5"  (165.1 cm) Weight: 197 lb 9.6 oz (89.631 kg) IBW/kg (Calculated) : 57 Heparin Dosing Weight: 74 kg  Vital Signs: Temp: 98.7 F (37.1 C) (06/12 0500) Temp Source: Oral (06/12 0500) BP: 122/74 mmHg (06/12 0500) Pulse Rate: 96 (06/12 0500)  Labs:  Recent Labs  10/13/15 0218 10/13/15 0523 10/13/15 0754  10/13/15 2122 10/14/15 0417 10/14/15 0951 10/15/15 0453  HGB  --  12.7  --   --   --  12.2  --  12.0  HCT  --  38.5  --   --   --  37.7  --  37.2  PLT  --  233  --   --   --  251  --  234  LABPROT  --   --   --   --   --  14.7  --   --   INR  --   --   --   --   --  1.13  --   --   HEPARINUNFRC  --  <0.10*  --   < > 0.49 0.46  --  0.48  CREATININE  --   --   --   < >  --  1.33* 1.33* 1.25*  TROPONINI 4.50* 3.92* 3.22*  --   --   --   --   --   < > = values in this interval not displayed.  Estimated Creatinine Clearance: 58.8 mL/min (by C-G formula based on Cr of 1.25).  Assessment: 50 y.o. female with chest pain, continuing on heparin. CT Angio negative for PE. CBC wnl, no bleed documented.  Heparin level (0.48) remains therapeutic on 1200 units/hr.  CBC stable. No bleeding noted.  EF 15-20% from ECHO 6/10, cath today  Goal of Therapy:  Heparin level 0.3-0.7 units/ml Monitor platelets by anticoagulation protocol: Yes   Plan:  Continue Heparin at 1200 units/hr Follow up post CATH  Bayard Hugger, PharmD, BCPS  Clinical Pharmacist  Pager: [phone]  10/15/2015 8:42 AM

## 2015-10-15 NOTE — H&P (View-Only) (Signed)
SUBJECTIVE:  Her biggest complaint is SOB.  She is not having any chest pain.    PHYSICAL EXAM Filed Vitals:   10/14/15 0426 10/14/15 1515 10/14/15 2045 10/15/15 0500  BP: 153/92 151/89 126/69 122/74  Pulse: 104 106 95 96  Temp: 98.4 F (36.9 C) 98.6 F (37 C) 98.5 F (36.9 C) 98.7 F (37.1 C)  TempSrc: Oral Oral Oral Oral  Resp: 18 18    Height:      Weight: 197 lb 11.2 oz (89.676 kg)   197 lb 9.6 oz (89.631 kg)  SpO2: 96% 100% 99% 100%   General:  No distress Lungs:  Clear Heart:  RRR Abdomen:  Positive bowel sounds, no rebound no guarding Extremities:  No edema   LABS: Lab Results  Component Value Date   TROPONINI 3.22* 10/13/2015   Results for orders placed or performed during the hospital encounter of 10/12/15 (from the past 24 hour(s))  Glucose, capillary     Status: Abnormal   Collection Time: 10/14/15 11:22 AM  Result Value Ref Range   Glucose-Capillary 155 (H) 65 - 99 mg/dL  Glucose, capillary     Status: Abnormal   Collection Time: 10/14/15  4:17 PM  Result Value Ref Range   Glucose-Capillary 119 (H) 65 - 99 mg/dL  Glucose, capillary     Status: Abnormal   Collection Time: 10/14/15 10:16 PM  Result Value Ref Range   Glucose-Capillary 165 (H) 65 - 99 mg/dL  Magnesium     Status: None   Collection Time: 10/14/15 11:39 PM  Result Value Ref Range   Magnesium 1.8 1.7 - 2.4 mg/dL  CBC     Status: Abnormal   Collection Time: 10/15/15  4:53 AM  Result Value Ref Range   WBC 5.7 4.0 - 10.5 K/uL   RBC 5.60 (H) 3.87 - 5.11 MIL/uL   Hemoglobin 12.0 12.0 - 15.0 g/dL   HCT 16.1 09.6 - 04.5 %   MCV 66.4 (L) 78.0 - 100.0 fL   MCH 21.4 (L) 26.0 - 34.0 pg   MCHC 32.3 30.0 - 36.0 g/dL   RDW 40.9 81.1 - 91.4 %   Platelets 234 150 - 400 K/uL  Heparin level (unfractionated)     Status: None   Collection Time: 10/15/15  4:53 AM  Result Value Ref Range   Heparin Unfractionated 0.48 0.30 - 0.70 IU/mL  Basic metabolic panel     Status: Abnormal   Collection Time:  10/15/15  4:53 AM  Result Value Ref Range   Sodium 139 135 - 145 mmol/L   Potassium 3.9 3.5 - 5.1 mmol/L   Chloride 108 101 - 111 mmol/L   CO2 23 22 - 32 mmol/L   Glucose, Bld 139 (H) 65 - 99 mg/dL   BUN 17 6 - 20 mg/dL   Creatinine, Ser 7.82 (H) 0.44 - 1.00 mg/dL   Calcium 9.1 8.9 - 95.6 mg/dL   GFR calc non Af Amer 49 (L) >60 mL/min   GFR calc Af Amer 57 (L) >60 mL/min   Anion gap 8 5 - 15  Glucose, capillary     Status: Abnormal   Collection Time: 10/15/15  7:28 AM  Result Value Ref Range   Glucose-Capillary 160 (H) 65 - 99 mg/dL   Comment 1 Notify RN    Comment 2 Document in Chart     Intake/Output Summary (Last 24 hours) at 10/15/15 1043 Last data filed at 10/15/15 2130  Gross per 24 hour  Intake  1868.7 ml  Output      0 ml  Net 1868.7 ml    ASSESSMENT AND PLAN:  NSTEMI:  Cath today.    CARDIOMYOPATHY:  She has increased SOB.  I/O not complete.    Weight is essentially unchanged.  I think that she has decompensated HF.  I will ask for a right heart cath.   She will likely need increased diuresis after this.  Ultimately, since she needs a cardiologist in [place], she should follow in the HF clinic   HTN:     This is being managed in the context of treating his CHF.  Will continue to titrate diuretic and consider adding spironolactone.  Can switch to [facility] as an outpatient.    NSVT:   Runs of NSVT.  I will give a little magnesium.  Continue to titrate beta blocker and other meds.  She will likely need an ICD in the future.   Fayrene Fearing [facility] 10/15/2015 10:43 AM

## 2015-10-15 NOTE — Interval H&P Note (Signed)
Cath Lab Visit (complete for each Cath Lab visit)  Clinical Evaluation Leading to the Procedure:   ACS: Yes.    Non-ACS:    Anginal Classification: CCS III  Anti-ischemic medical therapy: Maximal Therapy (2 or more classes of medications)  Non-Invasive Test Results: No non-invasive testing performed  Prior CABG: No previous CABG      History and Physical Interval Note:  10/15/2015 11:39 AM  Sandra Owens  has presented today for surgery, with the diagnosis of unstable angina  The various methods of treatment have been discussed with the patient and family. After consideration of risks, benefits and other options for treatment, the patient has consented to  Procedure(s): Right/Left Heart Cath and Coronary Angiography (N/A) as a surgical intervention .  The patient's history has been reviewed, patient examined, no change in status, stable for surgery.  I have reviewed the patient's chart and labs.  Questions were answered to the patient's satisfaction.     [name]

## 2015-10-16 DIAGNOSIS — I5023 Acute on chronic systolic (congestive) heart failure: Secondary | ICD-10-CM

## 2015-10-16 LAB — CARBOXYHEMOGLOBIN
CARBOXYHEMOGLOBIN: 1.3 % (ref 0.5–1.5)
Methemoglobin: 0.9 % (ref 0.0–1.5)
O2 SAT: 76.3 %
TOTAL HEMOGLOBIN: 14.7 g/dL (ref 12.0–16.0)

## 2015-10-16 LAB — GLUCOSE, CAPILLARY
GLUCOSE-CAPILLARY: 140 mg/dL — AB (ref 65–99)
GLUCOSE-CAPILLARY: 84 mg/dL (ref 65–99)
Glucose-Capillary: 176 mg/dL — ABNORMAL HIGH (ref 65–99)
Glucose-Capillary: 144 mg/dL — ABNORMAL HIGH (ref 65–99)

## 2015-10-16 LAB — BASIC METABOLIC PANEL
Anion gap: 9 (ref 5–15)
BUN: 20 mg/dL (ref 6–20)
CALCIUM: 9.7 mg/dL (ref 8.9–10.3)
CHLORIDE: 104 mmol/L (ref 101–111)
CO2: 27 mmol/L (ref 22–32)
CREATININE: 1.34 mg/dL — AB (ref 0.44–1.00)
GFR calc Af Amer: 52 mL/min — ABNORMAL LOW (ref >60)
GFR calc non Af Amer: 45 mL/min — ABNORMAL LOW (ref >60)
Glucose, Bld: 154 mg/dL — ABNORMAL HIGH (ref 65–99)
Potassium: 3.3 mmol/L — ABNORMAL LOW (ref 3.5–5.1)
SODIUM: 140 mmol/L (ref 135–145)

## 2015-10-16 LAB — CBC
HCT: 38 % (ref 36.0–46.0)
HEMOGLOBIN: 12.4 g/dL (ref 12.0–15.0)
MCH: 21.5 pg — AB (ref 26.0–34.0)
MCHC: 32.6 g/dL (ref 30.0–36.0)
MCV: 66 fL — ABNORMAL LOW (ref 78.0–100.0)
PLATELETS: 242 10*3/uL (ref 150–400)
RBC: 5.76 MIL/uL — ABNORMAL HIGH (ref 3.87–5.11)
RDW: 14.7 % (ref 11.5–15.5)
WBC: 6.2 10*3/uL (ref 4.0–10.5)

## 2015-10-16 LAB — HEPARIN LEVEL (UNFRACTIONATED)
HEPARIN UNFRACTIONATED: 0.12 [IU]/mL — AB (ref 0.30–0.70)
Heparin Unfractionated: 0.41 IU/mL (ref 0.30–0.70)

## 2015-10-16 MED ORDER — SODIUM CHLORIDE 0.9% FLUSH: 10.0000 mL | INTRAVENOUS | Status: DC | PRN

## 2015-10-16 MED ORDER — SODIUM CHLORIDE 0.9% FLUSH
10.0000 mL | Freq: Two times a day (BID) | INTRAVENOUS | Status: DC
Start: 2015-10-16 — End: 2015-10-17
  Administered 2015-10-16 – 2015-10-17 (×2): 10 mL

## 2015-10-16 MED ORDER — ISOSORB DINITRATE-HYDRALAZINE 20-37.5 MG PO TABS
0.5000 | ORAL_TABLET | Freq: Three times a day (TID) | ORAL | Status: DC
  Administered 2015-10-16 (×3): 0.5 via ORAL
  Filled 2015-10-16 (×3): qty 1

## 2015-10-16 MED ORDER — POTASSIUM CHLORIDE CRYS ER 20 MEQ PO TBCR: 40.0000 meq | EXTENDED_RELEASE_TABLET | Freq: Once | ORAL | Status: DC

## 2015-10-16 MED ORDER — DIGOXIN 125 MCG PO TABS
0.1250 mg | ORAL_TABLET | Freq: Every day | ORAL | Status: DC
  Administered 2015-10-16 – 2015-10-19 (×4): 0.125 mg via ORAL
  Filled 2015-10-16 (×4): qty 1

## 2015-10-16 MED ORDER — POTASSIUM CHLORIDE CRYS ER 10 MEQ PO TBCR
40.0000 meq | EXTENDED_RELEASE_TABLET | Freq: Once | ORAL | Status: AC
  Administered 2015-10-16: 40 meq via ORAL
  Filled 2015-10-16: qty 4

## 2015-10-16 MED ORDER — INSULIN ASPART 100 UNIT/ML ~~LOC~~ SOLN
5.0000 [IU] | Freq: Three times a day (TID) | SUBCUTANEOUS | Status: DC
  Administered 2015-10-16 – 2015-10-19 (×7): 5 [IU] via SUBCUTANEOUS

## 2015-10-16 MED FILL — Nitroglycerin IV Soln 200 MCG/ML in D5W: INTRAVENOUS | Qty: 250 | Status: AC

## 2015-10-16 MED FILL — Heparin Sodium (Porcine) 2 Unit/ML in Sodium Chloride 0.9%: INTRAMUSCULAR | Qty: 1000 | Status: AC

## 2015-10-16 NOTE — Care Management Note (Signed)
Case Management Note  Patient Details  Name: Sandra Owens MRN: [id number] Date of Birth: 12-Aug-1965  Subjective/Objective:   CHF, NSTEMI, HTN               Action/Plan: Discharge Planning:  Chart reviewed. Benefit check for Entresto. Medication will need prior approval.   S/W JAMELA @ [employer] RX # [id number]   ENTRESTO 24/26 MG BID (30 )   COVER- NOT COVER  TIER- 2 DRUG  PRIOR APPROVAL - [phone]   Expected Discharge Date:                  Expected Discharge Plan:  Home w Home Health Services  In-House Referral:  NA  Discharge planning Services  CM Consult  Post Acute Care Choice:    Choice offered to:     DME Arranged:    DME Agency:     HH Arranged:    HH Agency:     Status of Service:  In process, will continue to follow  Medicare Important Message Given:    Date Medicare IM Given:    Medicare IM give by:    Date Additional Medicare IM Given:    Additional Medicare Important Message give by:     If discussed at Long Length of Stay Meetings, dates discussed:    Additional Comments:  Elliot Cousin, RN 10/16/2015, 2:43 PM

## 2015-10-16 NOTE — Progress Notes (Addendum)
  Advanced Heart Failure Rounding Note   Subjective:   She has severe systolic HF with markedly elevated filling pressures and low cardiac output. However on exam looks fairly well compensated. She has also been found to have high-grade LAD lesion and is pending PCI once she is better tuned up. HF preceded development of CAD and cardiomyopathy is likely mostly non-ischemic in nature.   Yesterday dig, spiro, entresto added. Diuresed with IV lasix. Negative 2.5 liters.   Denies SOB/CP.   Echo 10/13/15 LVEF 15-20% with diffuse hypokinesis, moderate LAE. Normal RV.   R/LCH 10/15/15  1. Ost 3rd Diag to 3rd Diag lesion, 75% stenosed. 2. Dist RCA lesion, 40% stenosed. 3. Mid Cx lesion, 70% stenosed. 4. Mid LAD lesion, 90% stenosed. 5. There is severe left ventricular systolic dysfunction.   Objective:   Weight Range:  Vital Signs:   Temp:  [97.9 F (36.6 C)-98.6 F (37 C)] 97.9 F (36.6 C) (06/13 0844) Pulse Rate:  [0-125] 125 (06/13 0800) Resp:  [0-30] 23 (06/13 0800) BP: (117-150)/(72-100) 117/95 mmHg (06/13 0800) SpO2:  [0 %-100 %] 98 % (06/13 0800) Weight:  [198 lb 10.2 oz (90.1 kg)] 198 lb 10.2 oz (90.1 kg) (06/12 1350) Last BM Date: 10/14/15  Weight change: Filed Weights   10/14/15 0426 10/15/15 0500 10/15/15 1350  Weight: 197 lb 11.2 oz (89.676 kg) 197 lb 9.6 oz (89.631 kg) 198 lb 10.2 oz (90.1 kg)    Intake/Output:   Intake/Output Summary (Last 24 hours) at 10/16/15 0906 Last data filed at 10/16/15 0400  Gross per 24 hour  Intake 1026.5 ml  Output   3650 ml  Net -2623.5 ml     Physical Exam: General:  Well appearing. No resp difficulty. Sitting on the side of the bed.  HEENT: normal Neck: supple. JVP ~10 . Carotids 2+ bilat; no bruits. No lymphadenopathy or thryomegaly appreciated. Cor: PMI nondisplaced. Regular rate & rhythm. No rubs, or murmurs. + S3  Lungs: clear Abdomen: soft, nontender, nondistended. No hepatosplenomegaly. No bruits or masses. Good  bowel sounds. Extremities: no cyanosis, clubbing, rash, edema Neuro: alert & orientedx3, cranial nerves grossly intact. moves all 4 extremities w/o difficulty. Affect pleasant  Telemetry: NSR 110  Labs: Basic Metabolic Panel:  Recent Labs Lab 10/13/15 0218 10/13/15 1050 10/14/15 0417 10/14/15 0951 10/14/15 2339 10/15/15 0453 10/16/15 0204  NA  --  137 138 137  --  139 140  K  --  3.6 3.8 3.9  --  3.9 3.3*  CL  --  108 106 108  --  108 104  CO2  --  23 24 23  --  23 27  GLUCOSE  --  124* 157* 207*  --  139* 154*  BUN  --  13 15 14  --  17 20  CREATININE  --  1.27* 1.33* 1.33*  --  1.25* 1.34*  CALCIUM  --  8.9 9.2 9.3  --  9.1 9.7  MG 1.9  --   --   --  1.8  --   --   PHOS 2.5  --   --   --   --   --   --     Liver Function Tests:  Recent Labs Lab 10/12/15 1836  AST 18  ALT 13*  ALKPHOS 45  BILITOT 0.8  PROT 7.4  ALBUMIN 3.7    Recent Labs Lab 10/12/15 1836  LIPASE 30   No results for input(s): AMMONIA in the last 168 hours.    CBC:  Recent Labs Lab 10/12/15 1555 10/13/15 0523 10/14/15 0417 10/15/15 0453 10/16/15 0204  WBC 5.7 6.3 6.9 5.7 6.2  HGB 13.7 12.7 12.2 12.0 12.4  HCT 41.8 38.5 37.7 37.2 38.0  MCV 66.8* 66.4* 67.4* 66.4* 66.0*  PLT 270 233 251 234 242    Cardiac Enzymes:  Recent Labs Lab 10/12/15 1836 10/12/15 2207 10/13/15 0218 10/13/15 0523 10/13/15 0754  TROPONINI 0.13* 3.72* 4.50* 3.92* 3.22*    BNP: BNP (last 3 results)  Recent Labs  10/24/14 1054  BNP 685.0*    ProBNP (last 3 results) No results for input(s): PROBNP in the last 8760 hours.    Other results:  Imaging:  No results found.   Medications:     Scheduled Medications: . aspirin  81 mg Oral Daily  . clopidogrel  75 mg Oral Q breakfast  . digoxin  0.125 mg Oral Q24H  . furosemide  80 mg Intravenous BID  . insulin aspart  0-20 Units Subcutaneous TID WC  . insulin aspart  3 Units Subcutaneous TID WC  . linagliptin  5 mg Oral Daily  .  nicotine  14 mg Transdermal Daily  . sacubitril-valsartan  1 tablet Oral BID  . sodium chloride flush  3 mL Intravenous Q12H  . spironolactone  12.5 mg Oral Daily     Infusions: . heparin 1,300 Units/hr (10/16/15 0321)  . nitroGLYCERIN 20 mcg/min (10/15/15 1800)     PRN Medications:  sodium chloride, acetaminophen, albuterol, famotidine, fluticasone furoate-vilanterol, gi cocktail, morphine injection, nitroGLYCERIN, ondansetron (ZOFRAN) IV, ondansetron (ZOFRAN) IV, sodium chloride flush, zolpidem   Assessment:   1. Acute on chronic systolic HF 2. NSTEMI with CAD 3. HTN 4. NSVT 5. Hypokalemia  Plan/Discussion:   Place PICC today for CO-OX and CVP.  +/- milrinone after CO-OX.Continue IV lasix twice a day. Stop nitro drip. Add bidil 1/2 tid. Continue entresto 24-26 mg twice a day, dig, and spiro. Renal function stable.  Timing of intervention  Per Dr Gala Romney.  Length of Stay: 3   Amy Clegg NP-C  10/16/2015, 9:06 AM  Advanced Heart Failure Team Pager [phone] (M-F; 7a - 4p)  Please contact [facility] for night-coverage after hours (4p -7a ) and weekends on [email or web address]  Patient seen and examined with Tonye Becket, NP. We discussed all aspects of the encounter. I agree with the assessment and plan as stated above.   She is improved with diuresis and adjustment of HF regimen. Respiratory status more stable. No chest pain. PiCC placed and co-ox now up to 76%. Will continue IV diuresis and adjustment of HF meds. Renal function ok. Supp K+. Will need PCI of LAD later in the week. (probably Friday)  Will need sleep study as outpatient.   Etana Beets,MD 8:11 PM

## 2015-10-16 NOTE — Progress Notes (Signed)
ANTICOAGULATION CONSULT NOTE Pharmacy Consult for Heparin Indication: chest pain/ACS  No Known Allergies  Patient Measurements: Height: 5\' 5"  (165.1 cm) Weight: 198 lb 10.2 oz (90.1 kg) IBW/kg (Calculated) : 57 Heparin Dosing Weight: 74 kg  Vital Signs: Temp: 98 F (36.7 C) (06/13 1100) Temp Source: Oral (06/13 1100) BP: 102/71 mmHg (06/13 1100) Pulse Rate: 101 (06/13 1100)  Labs:  Recent Labs  10/14/15 0417 10/14/15 0951 10/15/15 0453 10/16/15 0204 10/16/15 1223  HGB 12.2  --  12.0 12.4  --   HCT 37.7  --  37.2 38.0  --   PLT 251  --  234 242  --   LABPROT 14.7  --   --   --   --   INR 1.13  --   --   --   --   HEPARINUNFRC 0.46  --  0.48 0.12* 0.41  CREATININE 1.33* 1.33* 1.25* 1.34*  --     Estimated Creatinine Clearance: 55 mL/min (by C-G formula based on Cr of 1.34).  Assessment: 50 y.o. female with chest pain, continuing on heparin. CT Angio negative for PE. CBC wnl, no bleed documented.  Heparin level (0.4) therapeutic on 1300 units/hr.  CBC stable. No bleeding noted.  EF 15-20% from ECHO 6/10.  Cath revealed 95% LAD stenosis, for pci once HF is stable.  Goal of Therapy:  Heparin level 0.3-0.7 units/ml Monitor platelets by anticoagulation protocol: Yes   Plan:  Continue Heparin at 1300 units/hr Follow up am labs  Sheppard Coil PharmD., BCPS Clinical Pharmacist Pager [phone] 10/16/2015 2:33 PM

## 2015-10-16 NOTE — Progress Notes (Signed)
[facility]                                                                              Patient Demographics  Sandra Owens, is a 50 y.o. female, DOB - 18-Feb-1966, NTI:[id number]  Admit date - 10/12/2015   Admitting Physician Haydee Salter, MD  Outpatient Primary MD for the patient is Allean Found, MD  Outpatient specialists:   LOS - 3  days    Chief Complaint  Patient presents with  . Chest Pain       Brief summary   Sandra Owens is a 50 y.o. female With hypertension, diabetes, GERD presented to ED with chest pain. Patient reported that she woke up at 7 AM on the morning of admission and thought she had indigestion however that her discomfort and to new throughout the day with no relief of symptoms. Hence presented to ED.  Troponin was elevated initially at 0.13 subsequently 3.72. Patient was admitted for further workup and cardiology was consulted. She was started on heparin drip. CT angiogram of the chest showed no pulmonary embolism.  Assessment & Plan    Principal Problem:   NSTEMI (non-ST elevated myocardial infarction) (HCC) - 2-D echo showed EF of 15-20% with diffuse hypokinesis -Patient underwent right and left heart cath on 6/12 which showed severe biventricular CHF EF less than 20%,, severe pulmonary hypertension with PA pressure 54mm Hg. severe 95% mid LAD stenosis, new likely culprit for patient's ACS. - CHF team was consulted and patient was started on nitro drip and aggressive IV Lasix. Patient was transferred to stepdown unit. Per Dr. Katrinka Blazing, LAD PCI once patient more stable from CHF standpoint.  Acute on chronic biventricular systolic CHF with severe pulmonary hypertension - Highly appreciate CHF team following, continue IV diuresis, plan to stop nitro drip today - Started on entresto, digoxin and spironolactone - Follow renal function, K closely - Still positive balance of 280 mL  Acute kidney injury: Baseline Cr nl, Cr  on admit 1.42 - Creatinine stable at 1.3 -  discontinued HCTZ   Essential HTN -BP currently stable however tachycardiac, cardiology following - Started Bidil today  GERD - Continue Pepcid  DM II Tradjenta 5 mg daily, replacing Januvia Sliding scale insulin, placed on NovoLog meal coverage - Increased meal coverage  Low Back Pain Tylenol 650 mg every 4 hours prn  COPD Breo prn qd, Albuterol 1-2 puffs every 4 hours when necessary wheezing or shortness of breath  Code Status: Full code  DVT Prophylaxis:   heparin drip   Family Communication: Discussed in detail with the patient, all imaging results, lab results explained to the patient    Disposition Plan:    Time Spent in minutes   25 minutes  Procedures:  Right and left cardiac cath 6/12 1. Ost 3rd Diag to 3rd Diag lesion, 75% stenosed. 2. Dist RCA lesion, 40% stenosed. 3. Mid Cx lesion, 70% stenosed. 4. Mid LAD lesion, 90% stenosed. 5. There is severe left ventricular systolic dysfunction.   Severe left heart failure with LVEF less than 20% and marked elevation in left ventricular end-diastolic pressure, 37 mmHg.  Severe right heart  failure with RV end-diastolic pressure 33 mmHg.  Severe pulmonary hypertension with PA systolic pressures in the range of 70 mmHg. Pulmonary capillary wedge pressure (mean) 40 mmHg).  Severe, 95%, mid LAD stenosis which is new compared to July 2016, related to plaque rupture. This is likely the culprit for the patient's acute coronary syndrome. There is moderate disease in the circumflex beyond a large first obtuse marginal branch.   Consultants:   Cardiology  Antimicrobials:      Medications  Scheduled Meds: . aspirin  81 mg Oral Daily  . clopidogrel  75 mg Oral Q breakfast  . digoxin  0.125 mg Oral Daily  . furosemide  80 mg Intravenous BID  . insulin aspart  0-20 Units Subcutaneous TID WC  . insulin aspart  3 Units Subcutaneous TID WC  . isosorbide-hydrALAZINE  0.5  tablet Oral TID  . linagliptin  5 mg Oral Daily  . nicotine  14 mg Transdermal Daily  . sacubitril-valsartan  1 tablet Oral BID  . sodium chloride flush  3 mL Intravenous Q12H  . spironolactone  12.5 mg Oral Daily   Continuous Infusions: . heparin 1,300 Units/hr (10/16/15 0321)   PRN Meds:.sodium chloride, acetaminophen, albuterol, famotidine, fluticasone furoate-vilanterol, gi cocktail, morphine injection, nitroGLYCERIN, ondansetron (ZOFRAN) IV, ondansetron (ZOFRAN) IV, sodium chloride flush, zolpidem   Antibiotics   Anti-infectives    None        Subjective:   Sandra Owens was seen and examined today.Overall feeling better today, shortness of breath is improving.Marland Kitchen No chest pain.  Patient denied dizziness, abdominal pain, N/V/D/C, new weakness, numbess, tingling. No acute events overnight.    Objective:   Filed Vitals:   10/16/15 0500 10/16/15 0600 10/16/15 0800 10/16/15 0844  BP: 141/80 124/78 117/95   Pulse: 94 97 125   Temp:   98 F (36.7 C) 97.9 F (36.6 C)  TempSrc:   Oral Axillary  Resp: 24 21 23    Height:      Weight:      SpO2: 100% 100% 98%     Intake/Output Summary (Last 24 hours) at 10/16/15 1113 Last data filed at 10/16/15 1000  Gross per 24 hour  Intake 851.85 ml  Output   3650 ml  Net -2798.15 ml     Wt Readings from Last 3 Encounters:  10/15/15 90.1 kg (198 lb 10.2 oz)  11/23/14 84.823 kg (187 lb)  11/21/14 84.913 kg (187 lb 3.2 oz)     Exam  General: Alert and oriented x 3, NAD  HEENT:    Neck: +JVP  Cardiovascular: S1 S2clear, RRR  Respiratory: Decreased breath sounds at the bases  Gastrointestinal: Soft, nontender, nondistended, + bowel sounds  Ext: no cyanosis clubbing or edema  Neuro no new deficits  Skin: No rashes  Psych: Normal affect and demeanor, alert and oriented x3    Data Reviewed:  I have personally reviewed following labs and imaging studies  Micro Results No results found for this or any  previous visit (from the past 240 hour(s)).  Radiology Reports Dg Chest 2 View  10/12/2015  CLINICAL DATA:  Central chest pain, initial encounter EXAM: CHEST  2 VIEW COMPARISON:  10/24/2014 FINDINGS: Cardiac shadow is mildly enlarged. Mild central vascular congestion is noted without pulmonary edema. No sizable effusion or infiltrate is seen. No bony abnormality is noted. IMPRESSION: Mild vascular congestion without edema. Electronically Signed   By: Alcide Clever M.D.   On: 10/12/2015 16:34   Ct Angio Chest Pe W/cm &/or Wo  Cm  10/12/2015  CLINICAL DATA:  Chest pain. Hypertension. Diabetes. Short of breath and bilateral leg pain for 1 week. Elevated creatinine. Systolic congestive heart failure. EXAM: CT ANGIOGRAPHY CHEST WITH CONTRAST TECHNIQUE: Multidetector CT imaging of the chest was performed using the standard protocol during bolus administration of intravenous contrast. Multiplanar CT image reconstructions and MIPs were obtained to evaluate the vascular anatomy. CONTRAST:  80 cc of Isovue 370 COMPARISON:  Chest radiograph of earlier today.  No prior CT. FINDINGS: Mediastinum/Nodes: The quality of this exam for evaluation of pulmonary embolism is moderate to good. Despite Position and patient body habitus degradation, the bolus is relatively well timed. No evidence of pulmonary embolism. The thoracic aorta is not well opacified. No aneurysm identified. There is age advanced atherosclerosis. Moderate cardiomegaly. No mediastinal or hilar adenopathy. Lungs/Pleura: No pleural fluid. Mild degradation secondary to patient body habitus and arm position, not raised above the head. Clear lungs. Upper abdomen: Mild prominence the lateral segment left liver lobe. Possible subtle irregular hepatic capsule, including on image 90/series 4. Normal imaged portions of the spleen, stomach, pancreas, kidneys, gallbladder, right adrenal gland. Mild left adrenal thickening. Abdominal aortic atherosclerosis. Musculoskeletal:  No acute osseous abnormality. Review of the MIP images confirms the above findings. IMPRESSION: 1. Mild degradation secondary to patient body habitus, arm position. No pulmonary embolism. 2. Cardiomegaly with age advanced atherosclerosis. 3. Possible mild cirrhosis. Irregular hepatic capsule could alternatively be a CT artifact specific to iterative reconstruction. Correlate with risk factors. Electronically Signed   By: Jeronimo Greaves M.D.   On: 10/12/2015 20:04    Lab Data:  CBC:  Recent Labs Lab 10/12/15 1555 10/13/15 0523 10/14/15 0417 10/15/15 0453 10/16/15 0204  WBC 5.7 6.3 6.9 5.7 6.2  HGB 13.7 12.7 12.2 12.0 12.4  HCT 41.8 38.5 37.7 37.2 38.0  MCV 66.8* 66.4* 67.4* 66.4* 66.0*  PLT 270 233 251 234 242   Basic Metabolic Panel:  Recent Labs Lab 10/13/15 0218 10/13/15 1050 10/14/15 0417 10/14/15 0951 10/14/15 2339 10/15/15 0453 10/16/15 0204  NA  --  137 138 137  --  139 140  K  --  3.6 3.8 3.9  --  3.9 3.3*  CL  --  108 106 108  --  108 104  CO2  --  23 24 23   --  23 27  GLUCOSE  --  124* 157* 207*  --  139* 154*  BUN  --  13 15 14   --  17 20  CREATININE  --  1.27* 1.33* 1.33*  --  1.25* 1.34*  CALCIUM  --  8.9 9.2 9.3  --  9.1 9.7  MG 1.9  --   --   --  1.8  --   --   PHOS 2.5  --   --   --   --   --   --    GFR: Estimated Creatinine Clearance: 55 mL/min (by C-G formula based on Cr of 1.34). Liver Function Tests:  Recent Labs Lab 10/12/15 1836  AST 18  ALT 13*  ALKPHOS 45  BILITOT 0.8  PROT 7.4  ALBUMIN 3.7    Recent Labs Lab 10/12/15 1836  LIPASE 30   No results for input(s): AMMONIA in the last 168 hours. Coagulation Profile:  Recent Labs Lab 10/14/15 0417  INR 1.13   Cardiac Enzymes:  Recent Labs Lab 10/12/15 1836 10/12/15 2207 10/13/15 0218 10/13/15 0523 10/13/15 0754  TROPONINI 0.13* 3.72* 4.50* 3.92* 3.22*   BNP (last 3  results) No results for input(s): PROBNP in the last 8760 hours. HbA1C: No results for input(s): HGBA1C  in the last 72 hours. CBG:  Recent Labs Lab 10/15/15 0728 10/15/15 1252 10/15/15 1650 10/15/15 2128 10/16/15 0848  GLUCAP 160* 144* 179* 141* 140*   Lipid Profile:  Recent Labs  10/14/15 0417  CHOL 199  HDL 38*  LDLCALC 137*  TRIG 122  CHOLHDL 5.2   Thyroid Function Tests: No results for input(s): TSH, T4TOTAL, FREET4, T3FREE, THYROIDAB in the last 72 hours. Anemia Panel: No results for input(s): VITAMINB12, FOLATE, FERRITIN, TIBC, IRON, RETICCTPCT in the last 72 hours. Urine analysis: No results found for: COLORURINE, APPEARANCEUR, LABSPEC, PHURINE, GLUCOSEU, HGBUR, BILIRUBINUR, KETONESUR, PROTEINUR, UROBILINOGEN, NITRITE, Hurshel Party M.D. [facility] 10/16/2015, 11:13 AM  Pager: [phone] Between 7am to 7pm - call Pager - [phone]  After 7pm go to [email or web address] - password [email or web address]  Call night coverage person covering after 7pm

## 2015-10-16 NOTE — Plan of Care (Deleted)
CHF team assuming care per Dr Bensimhon's note yesterday. I will sign off. Please call me if any questions. Thanks    RAI,RIPUDEEP M.D. [facility] 10/16/2015, 7:29 AM  Pager: [phone]

## 2015-10-16 NOTE — Progress Notes (Signed)
Peripherally Inserted Central Catheter/Midline Placement  The IV Nurse has discussed with the patient and/or persons authorized to consent for the patient, the purpose of this procedure and the potential benefits and risks involved with this procedure.  The benefits include less needle sticks, lab draws from the catheter and patient may be discharged home with the catheter.  Risks include, but not limited to, infection, bleeding, blood clot (thrombus formation), and puncture of an artery; nerve damage and irregular heat beat.  Alternatives to this procedure were also discussed.  PICC/Midline Placement Documentation        Lisabeth Devoid 10/16/2015, 3:25 PM Consent obtained by Lazarus Gowda, RN

## 2015-10-16 NOTE — Progress Notes (Signed)
ANTICOAGULATION CONSULT NOTE - Follow Up Consult  Pharmacy Consult for Heparin  Indication: chest pain/ACS, s/p cath  No Known Allergies  Patient Measurements: Height: 5\' 5"  (165.1 cm) Weight: 198 lb 10.2 oz (90.1 kg) IBW/kg (Calculated) : 57  Vital Signs: Temp: 98.3 F (36.8 C) (06/12 2330) Temp Source: Oral (06/12 2330) BP: 150/85 mmHg (06/13 0000) Pulse Rate: 95 (06/13 0000)  Labs:  Recent Labs  10/13/15 0523 10/13/15 0754  10/14/15 0417 10/14/15 0951 10/15/15 0453 10/16/15 0204  HGB 12.7  --   --  12.2  --  12.0 12.4  HCT 38.5  --   --  37.7  --  37.2 38.0  PLT 233  --   --  251  --  234 242  LABPROT  --   --   --  14.7  --   --   --   INR  --   --   --  1.13  --   --   --   HEPARINUNFRC <0.10*  --   < > 0.46  --  0.48 0.12*  CREATININE  --   --   < > 1.33* 1.33* 1.25* 1.34*  TROPONINI 3.92* 3.22*  --   --   --   --   --   < > = values in this interval not displayed.  Estimated Creatinine Clearance: 55 mL/min (by C-G formula based on Cr of 1.34).  Assessment: Heparin level is sub-therapeutic after re-start s/p cath, likely back for PCI once HF is stable, no issues per RN. Heparin level drawn slightly early so will be more conservative with rate increase.  Goal of Therapy:  Heparin level 0.3-0.7 units/ml Monitor platelets by anticoagulation protocol: Yes   Plan:  -Increase heparin to 1300 units/hr -1100 HL  Abran Duke 10/16/2015,3:16 AM

## 2015-10-17 ENCOUNTER — Encounter (HOSPITAL_COMMUNITY)
Admission: EM | Disposition: A | Discharge status: Home Health | Payer: Self-pay | Source: Home / Self Care | Attending: Internal Medicine

## 2015-10-17 HISTORY — PX: CARDIAC CATHETERIZATION: SHX172

## 2015-10-17 LAB — CBC
HEMATOCRIT: 41.5 % (ref 36.0–46.0)
HEMOGLOBIN: 13.6 g/dL (ref 12.0–15.0)
MCH: 21.9 pg — ABNORMAL LOW (ref 26.0–34.0)
MCHC: 32.8 g/dL (ref 30.0–36.0)
MCV: 66.7 fL — AB (ref 78.0–100.0)
Platelets: 249 10*3/uL (ref 150–400)
RBC: 6.22 MIL/uL — ABNORMAL HIGH (ref 3.87–5.11)
RDW: 15.1 % (ref 11.5–15.5)
WBC: 7 10*3/uL (ref 4.0–10.5)

## 2015-10-17 LAB — POCT ACTIVATED CLOTTING TIME: Activated Clotting Time: 659 seconds

## 2015-10-17 LAB — BASIC METABOLIC PANEL
ANION GAP: 10 (ref 5–15)
BUN: 22 mg/dL — ABNORMAL HIGH (ref 6–20)
CHLORIDE: 101 mmol/L (ref 101–111)
CO2: 26 mmol/L (ref 22–32)
Calcium: 9.7 mg/dL (ref 8.9–10.3)
Creatinine, Ser: 1.22 mg/dL — ABNORMAL HIGH (ref 0.44–1.00)
GFR calc non Af Amer: 50 mL/min — ABNORMAL LOW (ref >60)
GFR, EST AFRICAN AMERICAN: 58 mL/min — AB (ref >60)
GLUCOSE: 148 mg/dL — AB (ref 65–99)
POTASSIUM: 3.5 mmol/L (ref 3.5–5.1)
Sodium: 137 mmol/L (ref 135–145)

## 2015-10-17 LAB — GLUCOSE, CAPILLARY
GLUCOSE-CAPILLARY: 147 mg/dL — AB (ref 65–99)
GLUCOSE-CAPILLARY: 135 mg/dL — AB (ref 65–99)
Glucose-Capillary: 166 mg/dL — ABNORMAL HIGH (ref 65–99)

## 2015-10-17 LAB — CARBOXYHEMOGLOBIN
Carboxyhemoglobin: 1.2 % (ref 0.5–1.5)
Methemoglobin: 0.8 % (ref 0.0–1.5)
O2 Saturation: 64.2 %
TOTAL HEMOGLOBIN: 14.7 g/dL (ref 12.0–16.0)

## 2015-10-17 LAB — HEPARIN LEVEL (UNFRACTIONATED): Heparin Unfractionated: 0.34 IU/mL (ref 0.30–0.70)

## 2015-10-17 LAB — TROPONIN I: TROPONIN I: 0.47 ng/mL — AB (ref <0.031)

## 2015-10-17 SURGERY — CORONARY STENT INTERVENTION

## 2015-10-17 MED ORDER — POTASSIUM CHLORIDE CRYS ER 20 MEQ PO TBCR
40.0000 meq | EXTENDED_RELEASE_TABLET | Freq: Once | ORAL | Status: AC
  Administered 2015-10-17: 40 meq via ORAL
  Filled 2015-10-17: qty 2

## 2015-10-17 MED ORDER — ATROPINE SULFATE 1 MG/10ML IJ SOSY
PREFILLED_SYRINGE | INTRAMUSCULAR | Status: AC
  Filled 2015-10-17: qty 10

## 2015-10-17 MED ORDER — FENTANYL CITRATE (PF) 100 MCG/2ML IJ SOLN
INTRAMUSCULAR | Status: AC
  Filled 2015-10-17: qty 2

## 2015-10-17 MED ORDER — SODIUM CHLORIDE 0.9 % IV SOLN: 250.0000 mL | INTRAVENOUS | Status: DC | PRN

## 2015-10-17 MED ORDER — LIDOCAINE HCL (PF) 1 % IJ SOLN
INTRAMUSCULAR | Status: DC | PRN
  Administered 2015-10-17: 18 mL

## 2015-10-17 MED ORDER — OXYCODONE-ACETAMINOPHEN 5-325 MG PO TABS: 1.0000 | ORAL_TABLET | ORAL | Status: DC | PRN

## 2015-10-17 MED ORDER — SODIUM CHLORIDE 0.9% FLUSH: 3.0000 mL | Freq: Two times a day (BID) | INTRAVENOUS | Status: DC

## 2015-10-17 MED ORDER — NITROGLYCERIN IN D5W 200-5 MCG/ML-% IV SOLN
INTRAVENOUS | Status: DC | PRN
  Administered 2015-10-17: 5 ug/min via INTRAVENOUS

## 2015-10-17 MED ORDER — HEPARIN (PORCINE) IN NACL 2-0.9 UNIT/ML-% IJ SOLN
INTRAMUSCULAR | Status: AC
  Filled 2015-10-17: qty 1000

## 2015-10-17 MED ORDER — FENTANYL CITRATE (PF) 100 MCG/2ML IJ SOLN
INTRAMUSCULAR | Status: DC | PRN
  Administered 2015-10-17: 50 ug via INTRAVENOUS

## 2015-10-17 MED ORDER — SODIUM CHLORIDE 0.9% FLUSH: 3.0000 mL | INTRAVENOUS | Status: DC | PRN

## 2015-10-17 MED ORDER — NITROGLYCERIN 1 MG/10 ML FOR IR/CATH LAB
INTRA_ARTERIAL | Status: DC | PRN
  Administered 2015-10-17: 200 ug

## 2015-10-17 MED ORDER — HEPARIN (PORCINE) IN NACL 2-0.9 UNIT/ML-% IJ SOLN
INTRAMUSCULAR | Status: DC | PRN
  Administered 2015-10-17: 1000 mL

## 2015-10-17 MED ORDER — LIDOCAINE HCL (PF) 1 % IJ SOLN
INTRAMUSCULAR | Status: AC
  Filled 2015-10-17: qty 30

## 2015-10-17 MED ORDER — ACETAMINOPHEN 325 MG PO TABS: 650.0000 mg | ORAL_TABLET | ORAL | Status: DC | PRN

## 2015-10-17 MED ORDER — BIVALIRUDIN BOLUS VIA INFUSION - CUPID
INTRAVENOUS | Status: DC | PRN
  Administered 2015-10-17: 63.825 mg via INTRAVENOUS

## 2015-10-17 MED ORDER — NITROGLYCERIN IN D5W 200-5 MCG/ML-% IV SOLN: 0.0000 ug/min | INTRAVENOUS | Status: DC

## 2015-10-17 MED ORDER — IOPAMIDOL (ISOVUE-370) INJECTION 76%
INTRAVENOUS | Status: DC | PRN
Start: 2015-10-17 — End: 2015-10-17
  Administered 2015-10-17: 80 mL via INTRAVENOUS

## 2015-10-17 MED ORDER — TICAGRELOR 90 MG PO TABS
ORAL_TABLET | ORAL | Status: DC | PRN
  Administered 2015-10-17: 180 mg via ORAL

## 2015-10-17 MED ORDER — SODIUM CHLORIDE 0.9% FLUSH
3.0000 mL | Freq: Two times a day (BID) | INTRAVENOUS | Status: DC
  Administered 2015-10-17 – 2015-10-19 (×3): 3 mL via INTRAVENOUS

## 2015-10-17 MED ORDER — DOPAMINE-DEXTROSE 3.2-5 MG/ML-% IV SOLN
INTRAVENOUS | Status: AC
  Filled 2015-10-17: qty 250

## 2015-10-17 MED ORDER — BIVALIRUDIN 250 MG IV SOLR
INTRAVENOUS | Status: AC
  Filled 2015-10-17: qty 250

## 2015-10-17 MED ORDER — SODIUM CHLORIDE 0.9 % IV SOLN: INTRAVENOUS | Status: DC

## 2015-10-17 MED ORDER — TICAGRELOR 90 MG PO TABS
ORAL_TABLET | ORAL | Status: AC
  Filled 2015-10-17: qty 1

## 2015-10-17 MED ORDER — ONDANSETRON HCL 4 MG/2ML IJ SOLN
INTRAMUSCULAR | Status: AC
  Filled 2015-10-17: qty 2

## 2015-10-17 MED ORDER — HEPARIN SODIUM (PORCINE) 5000 UNIT/ML IJ SOLN
5000.0000 [IU] | Freq: Three times a day (TID) | INTRAMUSCULAR | Status: DC
  Administered 2015-10-17 – 2015-10-19 (×5): 5000 [IU] via SUBCUTANEOUS
  Filled 2015-10-17 (×5): qty 1

## 2015-10-17 MED ORDER — DOPAMINE-DEXTROSE 3.2-5 MG/ML-% IV SOLN
INTRAVENOUS | Status: DC | PRN
  Administered 2015-10-17: 20 ug/kg/min via INTRAVENOUS

## 2015-10-17 MED ORDER — IOPAMIDOL (ISOVUE-370) INJECTION 76%
INTRAVENOUS | Status: AC
  Filled 2015-10-17: qty 50

## 2015-10-17 MED ORDER — NITROGLYCERIN 1 MG/10 ML FOR IR/CATH LAB
INTRA_ARTERIAL | Status: AC
  Filled 2015-10-17: qty 10

## 2015-10-17 MED ORDER — ONDANSETRON HCL 4 MG/2ML IJ SOLN
INTRAMUSCULAR | Status: DC | PRN
  Administered 2015-10-17: 4 mg via INTRAVENOUS

## 2015-10-17 MED ORDER — ONDANSETRON HCL 4 MG/2ML IJ SOLN
4.0000 mg | Freq: Four times a day (QID) | INTRAMUSCULAR | Status: DC | PRN
  Administered 2015-10-17: 4 mg via INTRAVENOUS

## 2015-10-17 MED ORDER — ASPIRIN 81 MG PO CHEW: 81.0000 mg | CHEWABLE_TABLET | Freq: Every day | ORAL | Status: DC

## 2015-10-17 MED ORDER — TICAGRELOR 90 MG PO TABS
90.0000 mg | ORAL_TABLET | Freq: Two times a day (BID) | ORAL | Status: DC
  Administered 2015-10-18 – 2015-10-19 (×4): 90 mg via ORAL
  Filled 2015-10-17 (×4): qty 1

## 2015-10-17 MED ORDER — SODIUM CHLORIDE 0.9% FLUSH
3.0000 mL | INTRAVENOUS | Status: DC | PRN
  Administered 2015-10-18: 3 mL via INTRAVENOUS
  Filled 2015-10-17: qty 3

## 2015-10-17 MED ORDER — MIDAZOLAM HCL 2 MG/2ML IJ SOLN
INTRAMUSCULAR | Status: DC | PRN
  Administered 2015-10-17 (×2): 1 mg via INTRAVENOUS

## 2015-10-17 MED ORDER — MIDAZOLAM HCL 2 MG/2ML IJ SOLN
INTRAMUSCULAR | Status: AC
  Filled 2015-10-17: qty 2

## 2015-10-17 MED ORDER — SODIUM CHLORIDE 0.9 % IV SOLN
250.0000 mg | INTRAVENOUS | Status: DC | PRN
  Administered 2015-10-17: 1.75 mg/kg/h via INTRAVENOUS

## 2015-10-17 MED ORDER — IOPAMIDOL (ISOVUE-370) INJECTION 76%
INTRAVENOUS | Status: AC
  Filled 2015-10-17: qty 100

## 2015-10-17 MED ORDER — ISOSORB DINITRATE-HYDRALAZINE 20-37.5 MG PO TABS
1.0000 | ORAL_TABLET | Freq: Three times a day (TID) | ORAL | Status: DC
  Administered 2015-10-17 – 2015-10-19 (×6): 1 via ORAL
  Filled 2015-10-17 (×6): qty 1

## 2015-10-17 MED ORDER — SODIUM CHLORIDE 0.9 % WEIGHT BASED INFUSION: 1.0000 mL/kg/h | INTRAVENOUS | Status: AC

## 2015-10-17 SURGICAL SUPPLY — 17 items
BALLN EUPHORA RX 2.5X12 (BALLOONS) ×2
BALLN LINEAR 7.5FR IABP 34CC (BALLOONS) ×2
BALLN ~~LOC~~ EMERGE MR 3.25X30 (BALLOONS) ×2
BALLOON EUPHORA RX 2.5X12 (BALLOONS) ×1 IMPLANT
BALLOON LINEAR 7.5FR IABP 34CC (BALLOONS) ×1 IMPLANT
BALLOON ~~LOC~~ EMERGE MR 3.25X30 (BALLOONS) ×1 IMPLANT
CATH VISTA GUIDE 6FR XBLAD3.5 (CATHETERS) ×2 IMPLANT
ELECT DEFIB PAD ADLT CADENCE (PAD) ×2 IMPLANT
KIT ENCORE 26 ADVANTAGE (KITS) ×2 IMPLANT
KIT HEART LEFT (KITS) ×2 IMPLANT
PACK CARDIAC CATHETERIZATION (CUSTOM PROCEDURE TRAY) ×2 IMPLANT
SHEATH PINNACLE 6F 10CM (SHEATH) ×2 IMPLANT
STENT PROMUS PREM MR 3.0X32 (Permanent Stent) ×2 IMPLANT
TRANSDUCER W/STOPCOCK (MISCELLANEOUS) ×2 IMPLANT
TUBING CIL FLEX 10 FLL-RA (TUBING) ×2 IMPLANT
WIRE ASAHI PROWATER 180CM (WIRE) ×2 IMPLANT
WIRE EMERALD 3MM-J .035X150CM (WIRE) ×2 IMPLANT

## 2015-10-17 NOTE — Progress Notes (Signed)
[facility]                                                                              Patient Demographics  Sandra Owens, is a 50 y.o. female, DOB - [birth year], WUJ:[id number]  Admit date - 10/12/2015   Admitting Physician Haydee Salter, MD  Outpatient Primary MD for the patient is Allean Found, MD  Outpatient specialists:   LOS - 4  days    Chief Complaint  Patient presents with  . Chest Pain       Brief summary   Sandra Owens is a 50 y.o. female With hypertension, diabetes, GERD presented to ED with chest pain. Patient reported that she woke up at 7 AM on the morning of admission and thought she had indigestion however that her discomfort and to new throughout the day with no relief of symptoms. Hence presented to ED.  Troponin was elevated initially at 0.13 subsequently 3.72. Patient was admitted for further workup and cardiology was consulted. She was started on heparin drip. CT angiogram of the chest showed no pulmonary embolism.  Assessment & Plan    Principal Problem:   NSTEMI (non-ST elevated myocardial infarction) (HCC) - 2-D echo showed EF of 15-20% with diffuse hypokinesis - Patient underwent right and left heart cath on 6/12 which showed severe biventricular CHF EF less than 20%, severe pulmonary hypertension with PA pressure 70mm Hg. severe 95% mid LAD stenosis, new likely culprit for patient's ACS. - CHF team following, PCI planned tentatively on 6/16  Acute on chronic biventricular systolic CHF with severe pulmonary hypertension - CHF team following, recommended increase BiDil, continue entresto, digoxin and spironolactone - Negative balance of 17 mL - Management per CHF team  Acute kidney injury: Baseline Cr nl, Cr on admit 1.42 - Creatinine stable at 1.2 -  discontinued HCTZ   Essential HTN -BP currently stable however tachycardiac, cardiology following - Started Bidil today  GERD - Continue Pepcid  DM  II Tradjenta 5 mg daily, replacing Januvia Sliding scale insulin, placed on NovoLog meal coverage  Low Back Pain Tylenol 650 mg every 4 hours prn  COPD Breo prn qd, Albuterol 1-2 puffs every 4 hours when necessary wheezing or shortness of breath  Code Status: Full code  DVT Prophylaxis:   heparin drip   Family Communication: Discussed in detail with the patient, all imaging results, lab results explained to the patient    Disposition Plan:    Time Spent in minutes   25 minutes  Procedures:  Right and left cardiac cath 6/12 1. Ost 3rd Diag to 3rd Diag lesion, 75% stenosed. 2. Dist RCA lesion, 40% stenosed. 3. Mid Cx lesion, 70% stenosed. 4. Mid LAD lesion, 90% stenosed. 5. There is severe left ventricular systolic dysfunction.   Severe left heart failure with LVEF less than 20% and marked elevation in left ventricular end-diastolic pressure, 37 mmHg.  Severe right heart failure with RV end-diastolic pressure 33 mmHg.  Severe pulmonary hypertension with PA systolic pressures in the range of 70 mmHg. Pulmonary capillary wedge pressure (mean) 40 mmHg).  Severe, 95%, mid LAD stenosis which is new compared to July  2016, related to plaque rupture. This is likely the culprit for the patient's acute coronary syndrome. There is moderate disease in the circumflex beyond a large first obtuse marginal branch.   Consultants:   Cardiology  Antimicrobials:      Medications  Scheduled Meds: . aspirin  81 mg Oral Daily  . clopidogrel  75 mg Oral Q breakfast  . digoxin  0.125 mg Oral Daily  . insulin aspart  0-20 Units Subcutaneous TID WC  . insulin aspart  5 Units Subcutaneous TID WC  . isosorbide-hydrALAZINE  1 tablet Oral TID  . linagliptin  5 mg Oral Daily  . nicotine  14 mg Transdermal Daily  . sacubitril-valsartan  1 tablet Oral BID  . sodium chloride flush  10-40 mL Intracatheter Q12H  . sodium chloride flush  3 mL Intravenous Q12H  . spironolactone  12.5 mg Oral  Daily   Continuous Infusions: . heparin 1,300 Units/hr (10/16/15 1358)   PRN Meds:.sodium chloride, acetaminophen, albuterol, famotidine, fluticasone furoate-vilanterol, gi cocktail, morphine injection, nitroGLYCERIN, ondansetron (ZOFRAN) IV, ondansetron (ZOFRAN) IV, sodium chloride flush, sodium chloride flush, zolpidem   Antibiotics   Anti-infectives    None        Subjective:   Latara Micheli was seen and examined today. Overall improving. Feels rested after a good night. Shortness of breath is improving.  No chest pain.  Patient denied dizziness, abdominal pain, N/V/D/C, new weakness, numbess, tingling. No acute events overnight.    Objective:   Filed Vitals:   10/17/15 0416 10/17/15 0533 10/17/15 0734 10/17/15 1034  BP: 134/81  135/79   Pulse: 104   106  Temp: 98 F (36.7 C)  97.7 F (36.5 C)   TempSrc: Oral  Oral   Resp: 19  22   Height:      Weight:  85.1 kg (187 lb 9.8 oz)    SpO2: 98%  98%     Intake/Output Summary (Last 24 hours) at 10/17/15 1119 Last data filed at 10/17/15 1000  Gross per 24 hour  Intake    886 ml  Output   1200 ml  Net   -314 ml     Wt Readings from Last 3 Encounters:  10/17/15 85.1 kg (187 lb 9.8 oz)  11/23/14 84.823 kg (187 lb)  11/21/14 84.913 kg (187 lb 3.2 oz)     Exam  General: Alert and oriented x 3, NAD  HEENT:    Neck:  JVP flat  Cardiovascular: S1 S2clear, RRR  Respiratory:  Clear to auscultation bilaterally  Gastrointestinal: Soft, nontender, nondistended, + bowel sounds  Ext: no cyanosis clubbing or edema  Neuro no new deficits  Skin: No rashes  Psych: Normal affect and demeanor, alert and oriented x3    Data Reviewed:  I have personally reviewed following labs and imaging studies  Micro Results No results found for this or any previous visit (from the past 240 hour(s)).  Radiology Reports Dg Chest 2 View  10/12/2015  CLINICAL DATA:  Central chest pain, initial encounter EXAM: CHEST  2 VIEW  COMPARISON:  10/24/2014 FINDINGS: Cardiac shadow is mildly enlarged. Mild central vascular congestion is noted without pulmonary edema. No sizable effusion or infiltrate is seen. No bony abnormality is noted. IMPRESSION: Mild vascular congestion without edema. Electronically Signed   By: Alcide Clever M.D.   On: 10/12/2015 16:34   Ct Angio Chest Pe W/cm &/or Wo Cm  10/12/2015  CLINICAL DATA:  Chest pain. Hypertension. Diabetes. Short of breath and bilateral leg pain  for 1 week. Elevated creatinine. Systolic congestive heart failure. EXAM: CT ANGIOGRAPHY CHEST WITH CONTRAST TECHNIQUE: Multidetector CT imaging of the chest was performed using the standard protocol during bolus administration of intravenous contrast. Multiplanar CT image reconstructions and MIPs were obtained to evaluate the vascular anatomy. CONTRAST:  80 cc of Isovue 370 COMPARISON:  Chest radiograph of earlier today.  No prior CT. FINDINGS: Mediastinum/Nodes: The quality of this exam for evaluation of pulmonary embolism is moderate to good. Despite Position and patient body habitus degradation, the bolus is relatively well timed. No evidence of pulmonary embolism. The thoracic aorta is not well opacified. No aneurysm identified. There is age advanced atherosclerosis. Moderate cardiomegaly. No mediastinal or hilar adenopathy. Lungs/Pleura: No pleural fluid. Mild degradation secondary to patient body habitus and arm position, not raised above the head. Clear lungs. Upper abdomen: Mild prominence the lateral segment left liver lobe. Possible subtle irregular hepatic capsule, including on image 90/series 4. Normal imaged portions of the spleen, stomach, pancreas, kidneys, gallbladder, right adrenal gland. Mild left adrenal thickening. Abdominal aortic atherosclerosis. Musculoskeletal: No acute osseous abnormality. Review of the MIP images confirms the above findings. IMPRESSION: 1. Mild degradation secondary to patient body habitus, arm position. No  pulmonary embolism. 2. Cardiomegaly with age advanced atherosclerosis. 3. Possible mild cirrhosis. Irregular hepatic capsule could alternatively be a CT artifact specific to iterative reconstruction. Correlate with risk factors. Electronically Signed   By: Jeronimo Greaves M.D.   On: 10/12/2015 20:04    Lab Data:  CBC:  Recent Labs Lab 10/13/15 0523 10/14/15 0417 10/15/15 0453 10/16/15 0204 10/17/15 0515  WBC 6.3 6.9 5.7 6.2 7.0  HGB 12.7 12.2 12.0 12.4 13.6  HCT 38.5 37.7 37.2 38.0 41.5  MCV 66.4* 67.4* 66.4* 66.0* 66.7*  PLT 233 251 234 242 249   Basic Metabolic Panel:  Recent Labs Lab 10/13/15 0218  10/14/15 0417 10/14/15 0951 10/14/15 2339 10/15/15 0453 10/16/15 0204 10/17/15 0515  NA  --   < > 138 137  --  139 140 137  K  --   < > 3.8 3.9  --  3.9 3.3* 3.5  CL  --   < > 106 108  --  108 104 101  CO2  --   < > 24 23  --  23 27 26   GLUCOSE  --   < > 157* 207*  --  139* 154* 148*  BUN  --   < > 15 14  --  17 20 22*  CREATININE  --   < > 1.33* 1.33*  --  1.25* 1.34* 1.22*  CALCIUM  --   < > 9.2 9.3  --  9.1 9.7 9.7  MG 1.9  --   --   --  1.8  --   --   --   PHOS 2.5  --   --   --   --   --   --   --   < > = values in this interval not displayed. GFR: Estimated Creatinine Clearance: 58.7 mL/min (by C-G formula based on Cr of 1.22). Liver Function Tests:  Recent Labs Lab 10/12/15 1836  AST 18  ALT 13*  ALKPHOS 45  BILITOT 0.8  PROT 7.4  ALBUMIN 3.7    Recent Labs Lab 10/12/15 1836  LIPASE 30   No results for input(s): AMMONIA in the last 168 hours. Coagulation Profile:  Recent Labs Lab 10/14/15 0417  INR 1.13   Cardiac Enzymes:  Recent Labs Lab 10/12/15  1836 10/12/15 2207 10/13/15 0218 10/13/15 0523 10/13/15 0754  TROPONINI 0.13* 3.72* 4.50* 3.92* 3.22*   BNP (last 3 results) No results for input(s): PROBNP in the last 8760 hours. HbA1C: No results for input(s): HGBA1C in the last 72 hours. CBG:  Recent Labs Lab 10/16/15 0848  10/16/15 1227 10/16/15 1614 10/16/15 2139 10/17/15 0732  GLUCAP 140* 176* 84 144* 166*   Lipid Profile: No results for input(s): CHOL, HDL, LDLCALC, TRIG, CHOLHDL, LDLDIRECT in the last 72 hours. Thyroid Function Tests: No results for input(s): TSH, T4TOTAL, FREET4, T3FREE, THYROIDAB in the last 72 hours. Anemia Panel: No results for input(s): VITAMINB12, FOLATE, FERRITIN, TIBC, IRON, RETICCTPCT in the last 72 hours. Urine analysis: No results found for: COLORURINE, APPEARANCEUR, LABSPEC, PHURINE, GLUCOSEU, HGBUR, BILIRUBINUR, KETONESUR, PROTEINUR, UROBILINOGEN, NITRITE, Hurshel Party M.D. [facility] 10/17/2015, 11:19 AM  Pager: [phone] Between 7am to 7pm - call Pager - [phone]  After 7pm go to [email or web address] - password [email or web address]  Call night coverage person covering after 7pm

## 2015-10-17 NOTE — Progress Notes (Signed)
Advanced Heart Failure Rounding Note   Subjective:   She has severe systolic HF with markedly elevated filling pressures and low cardiac output. However on exam looks fairly well compensated. She has also been found to have high-grade LAD lesion and is pending PCI once she is better tuned up. HF preceded development of CAD and cardiomyopathy is likely mostly non-ischemic in nature.   Yesterday PICC placed with adequate CO-OX. Brisk diuresis noted. Weight down another pounds.   Denies SOB/CP.   Echo 10/13/15 LVEF 15-20% with diffuse hypokinesis, moderate LAE. Normal RV.   R/LCH 10/15/15  1. Ost 3rd Diag to 3rd Diag lesion, 75% stenosed. 2. Dist RCA lesion, 40% stenosed. 3. Mid Cx lesion, 70% stenosed. 4. Mid LAD lesion, 90% stenosed. 5. There is severe left ventricular systolic dysfunction.   Objective:   Weight Range:  Vital Signs:   Temp:  [97.7 F (36.5 C)-98.7 F (37.1 C)] 97.7 F (36.5 C) (06/14 0734) Pulse Rate:  [96-104] 104 (06/14 0416) Resp:  [17-24] 22 (06/14 0734) BP: (102-152)/(71-95) 135/79 mmHg (06/14 0734) SpO2:  [98 %-100 %] 98 % (06/14 0734) Weight:  [187 lb 9.8 oz (85.1 kg)] 187 lb 9.8 oz (85.1 kg) (06/14 0533) Last BM Date: 10/16/15  Weight change: Filed Weights   10/15/15 0500 10/15/15 1350 10/17/15 0533  Weight: 197 lb 9.6 oz (89.631 kg) 198 lb 10.2 oz (90.1 kg) 187 lb 9.8 oz (85.1 kg)    Intake/Output:   Intake/Output Summary (Last 24 hours) at 10/17/15 0819 Last data filed at 10/17/15 0600  Gross per 24 hour  Intake    873 ml  Output   1200 ml  Net   -327 ml     Physical Exam: CVP 1 General:  Well appearing. No resp difficulty. In bed. Marland Kitchen  HEENT: normal Neck: supple. JVP flat . Carotids 2+ bilat; no bruits. No lymphadenopathy or thryomegaly appreciated. Cor: PMI nondisplaced. Regular rate & rhythm. No rubs, or murmurs. + S3  Lungs: clear Abdomen: soft, nontender, nondistended. No hepatosplenomegaly. No bruits or masses. Good bowel  sounds. Extremities: no cyanosis, clubbing, rash, edema. RUE PICC Neuro: alert & orientedx3, cranial nerves grossly intact. moves all 4 extremities w/o difficulty. Affect pleasant  Telemetry: NSR 90s   Labs: Basic Metabolic Panel:  Recent Labs Lab 10/13/15 0218  10/14/15 0417 10/14/15 0951 10/14/15 2339 10/15/15 0453 10/16/15 0204 10/17/15 0515  NA  --   < > 138 137  --  139 140 137  K  --   < > 3.8 3.9  --  3.9 3.3* 3.5  CL  --   < > 106 108  --  108 104 101  CO2  --   < > 24 23  --  GLUCOSE  --   < > 157* 207*  --  139* 154* 148*  BUN  --   < > 15 14  --  17 20 22*  CREATININE  --   < > 1.33* 1.33*  --  1.25* 1.34* 1.22*  CALCIUM  --   < > 9.2 9.3  --  9.1 9.7 9.7  MG 1.9  --   --   --  1.8  --   --   --   PHOS 2.5  --   --   --   --   --   --   --   < > = values in this interval not displayed.  Liver Function Tests:  Recent  Labs Lab 10/12/15 1836  AST 18  ALT 13*  ALKPHOS 45  BILITOT 0.8  PROT 7.4  ALBUMIN 3.7    Recent Labs Lab 10/12/15 1836  LIPASE 30   No results for input(s): AMMONIA in the last 168 hours.  CBC:  Recent Labs Lab 10/13/15 0523 10/14/15 0417 10/15/15 0453 10/16/15 0204 10/17/15 0515  WBC 6.3 6.9 5.7 6.2 7.0  HGB 12.7 12.2 12.0 12.4 13.6  HCT 38.5 37.7 37.2 38.0 41.5  MCV 66.4* 67.4* 66.4* 66.0* 66.7*  PLT 233 251 234 242 249    Cardiac Enzymes:  Recent Labs Lab 10/12/15 1836 10/12/15 2207 10/13/15 0218 10/13/15 0523 10/13/15 0754  TROPONINI 0.13* 3.72* 4.50* 3.92* 3.22*    BNP: BNP (last 3 results)  Recent Labs  10/24/14 1054  BNP 685.0*    ProBNP (last 3 results) No results for input(s): PROBNP in the last 8760 hours.    Other results:  Imaging: No results found.   Medications:     Scheduled Medications: . aspirin  81 mg Oral Daily  . clopidogrel  75 mg Oral Q breakfast  . digoxin  0.125 mg Oral Daily  . furosemide  80 mg Intravenous BID  . insulin aspart  0-20 Units Subcutaneous  TID WC  . insulin aspart  5 Units Subcutaneous TID WC  . isosorbide-hydrALAZINE  0.5 tablet Oral TID  . linagliptin  5 mg Oral Daily  . nicotine  14 mg Transdermal Daily  . sacubitril-valsartan  1 tablet Oral BID  . sodium chloride flush  10-40 mL Intracatheter Q12H  . sodium chloride flush  3 mL Intravenous Q12H  . spironolactone  12.5 mg Oral Daily    Infusions: . heparin 1,300 Units/hr (10/16/15 1358)    PRN Medications: sodium chloride, acetaminophen, albuterol, famotidine, fluticasone furoate-vilanterol, gi cocktail, morphine injection, nitroGLYCERIN, ondansetron (ZOFRAN) IV, ondansetron (ZOFRAN) IV, sodium chloride flush, sodium chloride flush, zolpidem   Assessment:   1. Acute on chronic systolic HF 2. NSTEMI with CAD 3. HTN 4. NSVT 5. Hypokalemia  Plan/Discussion:   Check CO-OX. CVP 1. Hold diuretics today. Overall diuresed 9 pounds. Increase bidil to 1 tab tid. Continue entresto 24-26 mg twice a day, dig, and spiro. Renal function stable.  Timing of intervention -->  PCI of LAD later this week. Per Dr Gala Romney.  Length of Stay: 4   Amy Clegg NP-C  10/17/2015, 8:19 AM  Advanced Heart Failure Team Pager [phone] (M-F; 7a - 4p)  Please contact [facility] for night-coverage after hours (4p -7a ) and weekends on [email or web address]  Patient seen and examined with Tonye Becket, NP. We discussed all aspects of the encounter. I agree with the assessment and plan as stated above.   Much improved. CVP 1. Co-ox looks good. Renal function stable. Breathing much better. No CP. Discussed with Dr. Katrinka Blazing. For PCI of LAD today. Continue current HF meds. Will need sleep study as outpatient.   Efrat Zuidema,MD 3:50 PM

## 2015-10-17 NOTE — Progress Notes (Signed)
Cardiology on call Dr. Mayford Knife called to verify Ntg gtt and BP parameters to pull sheath. New orders obtained will continue to monitor.

## 2015-10-17 NOTE — Interval H&P Note (Signed)
Cath Lab Visit (complete for each Cath Lab visit)  Clinical Evaluation Leading to the Procedure:   ACS: Yes.    Non-ACS:    Anginal Classification: CCS III  Anti-ischemic medical therapy: Maximal Therapy (2 or more classes of medications)  Non-Invasive Test Results: No non-invasive testing performed  Prior CABG: No previous CABG      History and Physical Interval Note:  10/17/2015 3:37 PM Because of severe left ventricular dysfunction, PCI as considered to be a high risk procedure. This was discussed with the patient and family. An adverse outcome can be expected and up to 10-15% of such patients treated with percutaneous intervention. Occasions could include death, heart failure, infarction, bleeding, and stroke as the main potential adverse reactions. The patient, her daughters, a mother, and husband were present for the conversation. We all agreed to proceed as this seemed to be the most prudent management strategy as she has not a candidate for bypass surgery. Sandra Owens  has presented today for surgery, with the diagnosis of cad  The various methods of treatment have been discussed with the patient and family. After consideration of risks, benefits and other options for treatment, the patient has consented to  Procedure(s): Coronary Stent Intervention (N/A) as a surgical intervention .  The patient's history has been reviewed, patient examined, no change in status, stable for surgery.  I have reviewed the patient's chart and labs.  Questions were answered to the patient's satisfaction.     [name]

## 2015-10-17 NOTE — Progress Notes (Signed)
ANTICOAGULATION CONSULT NOTE Pharmacy Consult for Heparin Indication: chest pain/ACS  No Known Allergies  Patient Measurements: Height: 5\' 5"  (165.1 cm) Weight: 187 lb 9.8 oz (85.1 kg) IBW/kg (Calculated) : 57 Heparin Dosing Weight: 74 kg  Vital Signs: Temp: 97.7 F (36.5 C) (06/14 0734) Temp Source: Oral (06/14 0734) BP: 135/79 mmHg (06/14 0734) Pulse Rate: 104 (06/14 0416)  Labs:  Recent Labs  10/15/15 0453 10/16/15 0204 10/16/15 1223 10/17/15 0515 10/17/15 0519  HGB 12.0 12.4  --  13.6  --   HCT 37.2 38.0  --  41.5  --   PLT 234 242  --  249  --   HEPARINUNFRC 0.48 0.12* 0.41  --  0.34  CREATININE 1.25* 1.34*  --  1.22*  --     Estimated Creatinine Clearance: 58.7 mL/min (by C-G formula based on Cr of 1.22).  Assessment: 50 y.o. female with chest pain, continuing on heparin. CT Angio negative for PE. CBC wnl, no bleed documented.  Heparin level (0.3) therapeutic on 1300 units/hr.  CBC stable. No bleeding noted.  EF 15-20% from ECHO 6/10.  Cath revealed 95% LAD stenosis, for pci once HF is stable.  Goal of Therapy:  Heparin level 0.3-0.7 units/ml Monitor platelets by anticoagulation protocol: Yes   Plan:  Continue Heparin at 1300 units/hr Follow up am labs/cath plan  Sheppard Coil PharmD., BCPS Clinical Pharmacist Pager [phone] 10/17/2015 8:21 AM

## 2015-10-17 NOTE — H&P (View-Only) (Signed)
Advanced Heart Failure Rounding Note   Subjective:   She has severe systolic HF with markedly elevated filling pressures and low cardiac output. However on exam looks fairly well compensated. She has also been found to have high-grade LAD lesion and is pending PCI once she is better tuned up. HF preceded development of CAD and cardiomyopathy is likely mostly non-ischemic in nature.   Yesterday dig, spiro, entresto added. Diuresed with IV lasix. Negative 2.5 liters.   Denies SOB/CP.   Echo 10/13/15 LVEF 15-20% with diffuse hypokinesis, moderate LAE. Normal RV.   R/LCH 10/15/15  1. Ost 3rd Diag to 3rd Diag lesion, 75% stenosed. 2. Dist RCA lesion, 40% stenosed. 3. Mid Cx lesion, 70% stenosed. 4. Mid LAD lesion, 90% stenosed. 5. There is severe left ventricular systolic dysfunction.   Objective:   Weight Range:  Vital Signs:   Temp:  [97.9 F (36.6 C)-98.6 F (37 C)] 97.9 F (36.6 C) (06/13 0844) Pulse Rate:  [0-125] 125 (06/13 0800) Resp:  [0-30] 23 (06/13 0800) BP: (117-150)/(72-100) 117/95 mmHg (06/13 0800) SpO2:  [0 %-100 %] 98 % (06/13 0800) Weight:  [198 lb 10.2 oz (90.1 kg)] 198 lb 10.2 oz (90.1 kg) (06/12 1350) Last BM Date: 10/14/15  Weight change: Filed Weights   10/14/15 0426 10/15/15 0500 10/15/15 1350  Weight: 197 lb 11.2 oz (89.676 kg) 197 lb 9.6 oz (89.631 kg) 198 lb 10.2 oz (90.1 kg)    Intake/Output:   Intake/Output Summary (Last 24 hours) at 10/16/15 0906 Last data filed at 10/16/15 0400  Gross per 24 hour  Intake 1026.5 ml  Output   3650 ml  Net -2623.5 ml     Physical Exam: General:  Well appearing. No resp difficulty. Sitting on the side of the bed.  HEENT: normal Neck: supple. JVP ~10 . Carotids 2+ bilat; no bruits. No lymphadenopathy or thryomegaly appreciated. Cor: PMI nondisplaced. Regular rate & rhythm. No rubs, or murmurs. + S3  Lungs: clear Abdomen: soft, nontender, nondistended. No hepatosplenomegaly. No bruits or masses. Good  bowel sounds. Extremities: no cyanosis, clubbing, rash, edema Neuro: alert & orientedx3, cranial nerves grossly intact. moves all 4 extremities w/o difficulty. Affect pleasant  Telemetry: NSR 110  Labs: Basic Metabolic Panel:  Recent Labs Lab 10/13/15 0218 10/13/15 1050 10/14/15 0417 10/14/15 0951 10/14/15 2339 10/15/15 0453 10/16/15 0204  NA  --  137 138 137  --  139 140  K  --  3.6 3.8 3.9  --  3.9 3.3*  CL  --  108 106 108  --  108 104  CO2  --  --  23 27  GLUCOSE  --  124* 157* 207*  --  139* 154*  BUN  --  --  17 20  CREATININE  --  1.27* 1.33* 1.33*  --  1.25* 1.34*  CALCIUM  --  8.9 9.2 9.3  --  9.1 9.7  MG 1.9  --   --   --  1.8  --   --   PHOS 2.5  --   --   --   --   --   --     Liver Function Tests:  Recent Labs Lab 10/12/15 1836  AST 18  ALT 13*  ALKPHOS 45  BILITOT 0.8  PROT 7.4  ALBUMIN 3.7    Recent Labs Lab 10/12/15 1836  LIPASE 30   No results for input(s): AMMONIA in the last 168 hours.  CBC:  Recent Labs Lab 10/12/15 1555 10/13/15 0523 10/14/15 0417 10/15/15 0453 10/16/15 0204  WBC 5.7 6.3 6.9 5.7 6.2  HGB 13.7 12.7 12.2 12.0 12.4  HCT 41.8 38.5 37.7 37.2 38.0  MCV 66.8* 66.4* 67.4* 66.4* 66.0*  PLT 270 233 251 234 242    Cardiac Enzymes:  Recent Labs Lab 10/12/15 1836 10/12/15 2207 10/13/15 0218 10/13/15 0523 10/13/15 0754  TROPONINI 0.13* 3.72* 4.50* 3.92* 3.22*    BNP: BNP (last 3 results)  Recent Labs  10/24/14 1054  BNP 685.0*    ProBNP (last 3 results) No results for input(s): PROBNP in the last 8760 hours.    Other results:  Imaging:  No results found.   Medications:     Scheduled Medications: . aspirin  81 mg Oral Daily  . clopidogrel  75 mg Oral Q breakfast  . digoxin  0.125 mg Oral Q24H  . furosemide  80 mg Intravenous BID  . insulin aspart  0-20 Units Subcutaneous TID WC  . insulin aspart  3 Units Subcutaneous TID WC  . linagliptin  5 mg Oral Daily  .  nicotine  14 mg Transdermal Daily  . sacubitril-valsartan  1 tablet Oral BID  . sodium chloride flush  3 mL Intravenous Q12H  . spironolactone  12.5 mg Oral Daily     Infusions: . heparin 1,300 Units/hr (10/16/15 0321)  . nitroGLYCERIN 20 mcg/min (10/15/15 1800)     PRN Medications:  sodium chloride, acetaminophen, albuterol, famotidine, fluticasone furoate-vilanterol, gi cocktail, morphine injection, nitroGLYCERIN, ondansetron (ZOFRAN) IV, ondansetron (ZOFRAN) IV, sodium chloride flush, zolpidem   Assessment:   1. Acute on chronic systolic HF 2. NSTEMI with CAD 3. HTN 4. NSVT 5. Hypokalemia  Plan/Discussion:   Place PICC today for CO-OX and CVP.  +/- milrinone after CO-OX.Continue IV lasix twice a day. Stop nitro drip. Add bidil 1/2 tid. Continue entresto 24-26 mg twice a day, dig, and spiro. Renal function stable.  Timing of intervention  Per Dr Gala Romney.  Length of Stay: 3   Amy Clegg NP-C  10/16/2015, 9:06 AM  Advanced Heart Failure Team Pager [phone] (M-F; 7a - 4p)  Please contact [facility] for night-coverage after hours (4p -7a ) and weekends on [email or web address]  Patient seen and examined with Tonye Becket, NP. We discussed all aspects of the encounter. I agree with the assessment and plan as stated above.   She is improved with diuresis and adjustment of HF regimen. Respiratory status more stable. No chest pain. PiCC placed and co-ox now up to 76%. Will continue IV diuresis and adjustment of HF meds. Renal function ok. Supp K+. Will need PCI of LAD later in the week. (probably Friday)  Will need sleep study as outpatient.   Bensimhon, Daniel,MD 8:11 PM

## 2015-10-18 ENCOUNTER — Encounter (HOSPITAL_COMMUNITY): Payer: Self-pay | Admitting: Interventional Cardiology

## 2015-10-18 ENCOUNTER — Encounter (HOSPITAL_COMMUNITY): Payer: Self-pay

## 2015-10-18 DIAGNOSIS — N179 Acute kidney failure, unspecified: Secondary | ICD-10-CM

## 2015-10-18 LAB — TROPONIN I
Troponin I: 0.6 ng/mL (ref <0.031)
Troponin I: 0.65 ng/mL (ref <0.031)

## 2015-10-18 LAB — CBC
HCT: 40.9 % (ref 36.0–46.0)
HEMOGLOBIN: 13.6 g/dL (ref 12.0–15.0)
MCH: 21.8 pg — AB (ref 26.0–34.0)
MCHC: 33.3 g/dL (ref 30.0–36.0)
MCV: 65.5 fL — AB (ref 78.0–100.0)
Platelets: 274 10*3/uL (ref 150–400)
RBC: 6.24 MIL/uL — AB (ref 3.87–5.11)
RDW: 14.6 % (ref 11.5–15.5)
WBC: 7.5 10*3/uL (ref 4.0–10.5)

## 2015-10-18 LAB — HEMOGLOBIN AND HEMATOCRIT, BLOOD
HEMATOCRIT: 40.5 % (ref 36.0–46.0)
Hemoglobin: 13.5 g/dL (ref 12.0–15.0)

## 2015-10-18 LAB — BASIC METABOLIC PANEL
ANION GAP: 11 (ref 5–15)
BUN: 24 mg/dL — ABNORMAL HIGH (ref 6–20)
CALCIUM: 9.5 mg/dL (ref 8.9–10.3)
CHLORIDE: 105 mmol/L (ref 101–111)
CO2: 22 mmol/L (ref 22–32)
CREATININE: 1.52 mg/dL — AB (ref 0.44–1.00)
GFR, EST AFRICAN AMERICAN: 45 mL/min — AB (ref >60)
GFR, EST NON AFRICAN AMERICAN: 39 mL/min — AB (ref >60)
GLUCOSE: 224 mg/dL — AB (ref 65–99)
Potassium: 4.1 mmol/L (ref 3.5–5.1)
Sodium: 138 mmol/L (ref 135–145)

## 2015-10-18 LAB — GLUCOSE, CAPILLARY
GLUCOSE-CAPILLARY: 203 mg/dL — AB (ref 65–99)
GLUCOSE-CAPILLARY: 217 mg/dL — AB (ref 65–99)
Glucose-Capillary: 140 mg/dL — ABNORMAL HIGH (ref 65–99)
Glucose-Capillary: 119 mg/dL — ABNORMAL HIGH (ref 65–99)
Glucose-Capillary: 163 mg/dL — ABNORMAL HIGH (ref 65–99)

## 2015-10-18 LAB — CARBOXYHEMOGLOBIN
Carboxyhemoglobin: 0.7 % (ref 0.5–1.5)
Methemoglobin: 0.9 % (ref 0.0–1.5)
O2 SAT: 67.7 %
Total hemoglobin: 14.5 g/dL (ref 12.0–16.0)

## 2015-10-18 MED FILL — Atropine Sulfate Soln Prefill Syr 1 MG/10ML (0.1 MG/ML): INTRAMUSCULAR | Qty: 10 | Status: AC

## 2015-10-18 MED FILL — Lidocaine HCl Local Preservative Free (PF) Inj 1%: INTRAMUSCULAR | Qty: 30 | Status: AC

## 2015-10-18 NOTE — Progress Notes (Signed)
Pt called out and complained she didn't feel well, c/o " light headed, sweaty, dizzy" SBP dropped 170's to 80's Ntg gtt turned off, pt placed in trendelenburg. MD called no orders at this time. Will continue to monitor pt and call MD as needed

## 2015-10-18 NOTE — Progress Notes (Signed)
[facility]                                                                              Patient Demographics  Sandra Owens, is a 50 y.o. female, DOB - 12-10-1965, ZGY:[id number]  Admit date - 10/12/2015   Admitting Physician Haydee Salter, MD  Outpatient Primary MD for the patient is Allean Found, MD  Outpatient specialists:   LOS - 5  days    Chief Complaint  Patient presents with  . Chest Pain       Brief summary   Sandra Owens is a 50 y.o. female With hypertension, diabetes, GERD presented to ED with chest pain. Patient reported that she woke up at 7 AM on the morning of admission and thought she had indigestion however that her discomfort and to new throughout the day with no relief of symptoms. Hence presented to ED.  Troponin was elevated initially at 0.13 subsequently 3.72. Patient was admitted for further workup and cardiology was consulted. She was started on heparin drip. CT angiogram of the chest showed no pulmonary embolism.  Assessment & Plan    Principal Problem:   NSTEMI (non-ST elevated myocardial infarction) (HCC) - 2-D echo showed EF of 15-20% with diffuse hypokinesis - Patient underwent right and left heart cath on 6/12 which showed severe biventricular CHF EF less than 20%, severe pulmonary hypertension with PA pressure 61mm Hg. severe 95% mid LAD stenosis, new likely culprit for patient's ACS. - CI of LAD on 6/14 complicated with transient cardiogenic shock, now feeling better - Creatinine trended up to 1.5, troponin 0.65 - On Brilinta and aspirin  Acute on chronic biventricular systolic CHF with severe pulmonary hypertension - CHF team following, recommended increase BiDil, continue entresto, digoxin and spironolactone - Negative balance of 1.4 L  - Management per CHF team, place LifeVest prior to dc   Acute kidney injury: Baseline Cr nl, Cr on admit 1.42 - Creatinine trended up to 1.5 likely due to cardiogenic  shock  -  discontinued HCTZ   Essential HTN - BP soft today with bradycardia, hold off on beta blocker - on bidil  GERD - Continue Pepcid  DM II Tradjenta 5 mg daily, replacing Januvia Sliding scale insulin, placed on NovoLog meal coverage  Low Back Pain Tylenol 650 mg every 4 hours prn  COPD Breo prn qd, Albuterol 1-2 puffs every 4 hours when necessary wheezing or shortness of breath  Code Status: Full code  DVT Prophylaxis:   heparin drip   Family Communication: Discussed in detail with the patient, all imaging results, lab results explained to the patient    Disposition Plan:  Continue in stepdown per CHF team recommendations  Time Spent in minutes   25 minutes  Procedures:  Right and left cardiac cath 6/12 1. Ost 3rd Diag to 3rd Diag lesion, 75% stenosed. 2. Dist RCA lesion, 40% stenosed. 3. Mid Cx lesion, 70% stenosed. 4. Mid LAD lesion, 90% stenosed. 5. There is severe left ventricular systolic dysfunction.   Severe left heart failure with LVEF less than 20% and marked elevation in left ventricular end-diastolic pressure, 37 mmHg.  Severe right  heart failure with RV end-diastolic pressure 33 mmHg.  Severe pulmonary hypertension with PA systolic pressures in the range of 70 mmHg. Pulmonary capillary wedge pressure (mean) 40 mmHg).  Severe, 95%, mid LAD stenosis which is new compared to July 2016, related to plaque rupture. This is likely the culprit for the patient's acute coronary syndrome. There is moderate disease in the circumflex beyond a large first obtuse marginal branch.   Consultants:   Cardiology  Antimicrobials:      Medications  Scheduled Meds: . aspirin  81 mg Oral Daily  . digoxin  0.125 mg Oral Daily  . heparin  5,000 Units Subcutaneous Q8H  . insulin aspart  0-20 Units Subcutaneous TID WC  . insulin aspart  5 Units Subcutaneous TID WC  . isosorbide-hydrALAZINE  1 tablet Oral TID  . linagliptin  5 mg Oral Daily  . nicotine  14  mg Transdermal Daily  . sacubitril-valsartan  1 tablet Oral BID  . sodium chloride flush  3 mL Intravenous Q12H  . spironolactone  12.5 mg Oral Daily  . ticagrelor  90 mg Oral BID   Continuous Infusions: . nitroGLYCERIN Stopped (10/17/15 2245)   PRN Meds:.sodium chloride, sodium chloride, acetaminophen, albuterol, famotidine, fluticasone furoate-vilanterol, gi cocktail, morphine injection, nitroGLYCERIN, ondansetron (ZOFRAN) IV, oxyCODONE-acetaminophen, sodium chloride flush, zolpidem   Antibiotics   Anti-infectives    None        Subjective:   [name] was seen and examined today. Feeling frustrated at the time of the encounter due to lying flat since yesterday. Overall shortness of breath is improving. Had transient cardiogenic shock during PCI and overnight low BP readings. Shortness of breath is improving.  No chest pain.  Patient denied dizziness, abdominal pain, N/V/D/C, new weakness, numbess, tingling. No acute events overnight.    Objective:   Filed Vitals:   10/18/15 0500 10/18/15 0600 10/18/15 0804 10/18/15 1137  BP: 99/67 123/57 118/74 98/82  Pulse: 106 50 51   Temp:   97.7 F (36.5 C) 97.8 F (36.6 C)  TempSrc:   Oral Oral  Resp: 21 17 15 16   Height:      Weight:      SpO2: 100% 100% 100% 97%    Intake/Output Summary (Last 24 hours) at 10/18/15 1154 Last data filed at 10/18/15 0900  Gross per 24 hour  Intake 189.81 ml  Output   1600 ml  Net -1410.19 ml     Wt Readings from Last 3 Encounters:  10/17/15 85.1 kg (187 lb 9.8 oz)  11/23/14 84.823 kg (187 lb)  11/21/14 84.913 kg (187 lb 3.2 oz)     Exam  General: Alert and oriented x 3, NAD  HEENT:    Neck:  JVP flat  Cardiovascular: S1 S2clear, RRR  Respiratory:  Clear to auscultation bilaterally, No wheezing  Gastrointestinal: Soft, nontender, nondistended, + bowel sounds  Ext: no cyanosis clubbing or edema  Neuro no new deficits  Skin: No rashes  Psych: Normal affect and  demeanor, alert and oriented x3    Data Reviewed:  I have personally reviewed following labs and imaging studies  Micro Results No results found for this or any previous visit (from the past 240 hour(s)).  Radiology Reports Dg Chest 2 View  10/12/2015  CLINICAL DATA:  Central chest pain, initial encounter EXAM: CHEST  2 VIEW COMPARISON:  10/24/2014 FINDINGS: Cardiac shadow is mildly enlarged. Mild central vascular congestion is noted without pulmonary edema. No sizable effusion or infiltrate is seen. No bony abnormality  is noted. IMPRESSION: Mild vascular congestion without edema. Electronically Signed   By: Alcide Clever M.D.   On: 10/12/2015 16:34   Ct Angio Chest Pe W/cm &/or Wo Cm  10/12/2015  CLINICAL DATA:  Chest pain. Hypertension. Diabetes. Short of breath and bilateral leg pain for 1 week. Elevated creatinine. Systolic congestive heart failure. EXAM: CT ANGIOGRAPHY CHEST WITH CONTRAST TECHNIQUE: Multidetector CT imaging of the chest was performed using the standard protocol during bolus administration of intravenous contrast. Multiplanar CT image reconstructions and MIPs were obtained to evaluate the vascular anatomy. CONTRAST:  80 cc of Isovue 370 COMPARISON:  Chest radiograph of earlier today.  No prior CT. FINDINGS: Mediastinum/Nodes: The quality of this exam for evaluation of pulmonary embolism is moderate to good. Despite Position and patient body habitus degradation, the bolus is relatively well timed. No evidence of pulmonary embolism. The thoracic aorta is not well opacified. No aneurysm identified. There is age advanced atherosclerosis. Moderate cardiomegaly. No mediastinal or hilar adenopathy. Lungs/Pleura: No pleural fluid. Mild degradation secondary to patient body habitus and arm position, not raised above the head. Clear lungs. Upper abdomen: Mild prominence the lateral segment left liver lobe. Possible subtle irregular hepatic capsule, including on image 90/series 4. Normal imaged  portions of the spleen, stomach, pancreas, kidneys, gallbladder, right adrenal gland. Mild left adrenal thickening. Abdominal aortic atherosclerosis. Musculoskeletal: No acute osseous abnormality. Review of the MIP images confirms the above findings. IMPRESSION: 1. Mild degradation secondary to patient body habitus, arm position. No pulmonary embolism. 2. Cardiomegaly with age advanced atherosclerosis. 3. Possible mild cirrhosis. Irregular hepatic capsule could alternatively be a CT artifact specific to iterative reconstruction. Correlate with risk factors. Electronically Signed   By: Jeronimo Greaves M.D.   On: 10/12/2015 20:04    Lab Data:  CBC:  Recent Labs Lab 10/14/15 0417 10/15/15 0453 10/16/15 0204 10/17/15 0515 10/17/15 2350 10/18/15 0230  WBC 6.9 5.7 6.2 7.0  --  7.5  HGB 12.2 12.0 12.4 13.6 13.5 13.6  HCT 37.7 37.2 38.0 41.5 40.5 40.9  MCV 67.4* 66.4* 66.0* 66.7*  --  65.5*  PLT 251 234 242 249  --  274   Basic Metabolic Panel:  Recent Labs Lab 10/13/15 0218  10/14/15 0951 10/14/15 2339 10/15/15 0453 10/16/15 0204 10/17/15 0515 10/18/15 0230  NA  --   < > 137  --  139 140 137 138  K  --   < > 3.9  --  3.9 3.3* 3.5 4.1  CL  --   < > 108  --  108 104 101 105  CO2  --   < > 23  --  23 27 26 22   GLUCOSE  --   < > 207*  --  139* 154* 148* 224*  BUN  --   < > 14  --  17 20 22* 24*  CREATININE  --   < > 1.33*  --  1.25* 1.34* 1.22* 1.52*  CALCIUM  --   < > 9.3  --  9.1 9.7 9.7 9.5  MG 1.9  --   --  1.8  --   --   --   --   PHOS 2.5  --   --   --   --   --   --   --   < > = values in this interval not displayed. GFR: Estimated Creatinine Clearance: 47.1 mL/min (by C-G formula based on Cr of 1.52). Liver Function Tests:  Recent Labs Lab 10/12/15  1836  AST 18  ALT 13*  ALKPHOS 45  BILITOT 0.8  PROT 7.4  ALBUMIN 3.7    Recent Labs Lab 10/12/15 1836  LIPASE 30   No results for input(s): AMMONIA in the last 168 hours. Coagulation Profile:  Recent Labs Lab  10/14/15 0417  INR 1.13   Cardiac Enzymes:  Recent Labs Lab 10/13/15 0523 10/13/15 0754 10/17/15 2000 10/18/15 0230 10/18/15 0800  TROPONINI 3.92* 3.22* 0.47* 0.60* 0.65*   BNP (last 3 results) No results for input(s): PROBNP in the last 8760 hours. HbA1C: No results for input(s): HGBA1C in the last 72 hours. CBG:  Recent Labs Lab 10/17/15 0732 10/17/15 1221 10/17/15 2100 10/18/15 0802 10/18/15 1141  GLUCAP 166* 147* 135* 140* 203*   Lipid Profile: No results for input(s): CHOL, HDL, LDLCALC, TRIG, CHOLHDL, LDLDIRECT in the last 72 hours. Thyroid Function Tests: No results for input(s): TSH, T4TOTAL, FREET4, T3FREE, THYROIDAB in the last 72 hours. Anemia Panel: No results for input(s): VITAMINB12, FOLATE, FERRITIN, TIBC, IRON, RETICCTPCT in the last 72 hours. Urine analysis: No results found for: COLORURINE, APPEARANCEUR, LABSPEC, PHURINE, GLUCOSEU, HGBUR, BILIRUBINUR, KETONESUR, PROTEINUR, UROBILINOGEN, NITRITE, Hurshel Party M.D. [facility] 10/18/2015, 11:54 AM  Pager: [phone] Between 7am to 7pm - call Pager - [phone]  After 7pm go to [email or web address] - password [email or web address]  Call night coverage person covering after 7pm

## 2015-10-18 NOTE — Progress Notes (Signed)
CRITICAL VALUE ALERT  Critical value received:  Troponin 0.6  Date of notification:  10/18/2015  Time of notification:  0325  Critical value read back:Yes.    Nurse who received alert:  Dorna Leitz RN  MD notified (1st page):  Dr. Delton See   Time of first page:  0350  Responding MD:  Dr. Delton See  Time MD responded:  [phone]

## 2015-10-18 NOTE — Progress Notes (Signed)
CARDIAC REHAB PHASE I   PRE:  Rate/Rhythm: 117 ST  BP:  Supine:   Sitting: 115/67  Standing:     SaO2: 97 RA  MODE:  Ambulation: 300 ft   POST:  Rate/Rhythm: 123 ST  BP:  Supine:   Sitting: 128/99  Standing:    SaO2: 100 RA 1335-1440 Assisted X 1 to ambulate. Gait steady with hand held assist. HR after walk 123, BP 128/99. Started MI, stent and CHF education. I gave pt MI and CHF booklets also stent card. I stress fluid and sodium restrictions, daily weights and when to call MD. I gave her smoking cessation information and encouraged her to quit. I instructed pt how to view CHF video and she states she would watch it. We will continue to follow pt.  Adela Nilsson RN 10/18/2015 2:46 PM

## 2015-10-18 NOTE — Progress Notes (Signed)
Short term disability paperwork dropped off by family member and completed with Dr. Gala Romney. Family member retrieved paperwork for patient to complete and turn into employer. Copy of forms copied and scanned into patient's electronic medical record.  Ave Filter

## 2015-10-18 NOTE — Progress Notes (Signed)
Advanced Heart Failure Rounding Note   Subjective:    She has severe systolic HF with markedly elevated filling pressures and low cardiac output. However on exam looks fairly well compensated. She has also been found to have high-grade LAD lesion and is pending PCI once she is better tuned up. HF preceded development of CAD and cardiomyopathy is likely mostly non-ischemic in nature.   Underwent PCI of LAD 6/14. Complicated by transient cardiogenic shock in setting of a jailed septal perforator. Now feels better. No CP or SOB. Co-ox 68%. Creatinine up to 1.2-> 1.5. Troponin 0.6.  CVP 5     Echo 10/13/15 LVEF 15-20% with diffuse hypokinesis, moderate LAE. Normal RV.   R/LCH 10/15/15  1. Ost 3rd Diag to 3rd Diag lesion, 75% stenosed. 2. Dist RCA lesion, 40% stenosed. 3. Mid Cx lesion, 70% stenosed. 4. Mid LAD lesion, 90% stenosed. 5. There is severe left ventricular systolic dysfunction.   Objective:   Weight Range:  Vital Signs:   Temp:  [97.7 F (36.5 C)-98.2 F (36.8 C)] 97.7 F (36.5 C) (06/15 0804) Pulse Rate:  [0-116] 51 (06/15 0804) Resp:  [0-37] 15 (06/15 0804) BP: (73-196)/(46-101) 118/74 mmHg (06/15 0804) SpO2:  [0 %-100 %] 100 % (06/15 0804) Arterial Line BP: (81-197)/(49-97) 114/64 mmHg (06/14 2336) Last BM Date: 10/16/15  Weight change: Filed Weights   10/15/15 0500 10/15/15 1350 10/17/15 0533  Weight: 89.631 kg (197 lb 9.6 oz) 90.1 kg (198 lb 10.2 oz) 85.1 kg (187 lb 9.8 oz)    Intake/Output:   Intake/Output Summary (Last 24 hours) at 10/18/15 0829 Last data filed at 10/18/15 0000  Gross per 24 hour  Intake 108.81 ml  Output   1200 ml  Net -1091.19 ml     Physical Exam: CVP 5 General:  Lying in bed No resp difficulty.Marland Kitchen  HEENT: normal Neck: supple. JVP flat . Carotids 2+ bilat; no bruits. No lymphadenopathy or thryomegaly appreciated. Cor: PMI nondisplaced. Regular rate & rhythm. No rubs, or murmurs. + S3  Lungs: clear Abdomen: soft,  nontender, nondistended. No hepatosplenomegaly. No bruits or masses. Good bowel sounds. Extremities: no cyanosis, clubbing, rash, edema. RUE PICC Neuro: alert & orientedx3, cranial nerves grossly intact. moves all 4 extremities w/o difficulty. Affect pleasant  Telemetry: NSR 90s   Labs: Basic Metabolic Panel:  Recent Labs Lab 10/13/15 0218  10/14/15 0951 10/14/15 2339 10/15/15 0453 10/16/15 0204 10/17/15 0515 10/18/15 0230  NA  --   < > 137  --  139 140 137 138  K  --   < > 3.9  --  3.9 3.3* 3.5 4.1  CL  --   < > 108  --  108 104 101 105  CO2  --   < > 23  --  23 27 26 22   GLUCOSE  --   < > 207*  --  139* 154* 148* 224*  BUN  --   < > 14  --  17 20 22* 24*  CREATININE  --   < > 1.33*  --  1.25* 1.34* 1.22* 1.52*  CALCIUM  --   < > 9.3  --  9.1 9.7 9.7 9.5  MG 1.9  --   --  1.8  --   --   --   --   PHOS 2.5  --   --   --   --   --   --   --   < > = values in this interval not  displayed.  Liver Function Tests:  Recent Labs Lab 10/12/15 1836  AST 18  ALT 13*  ALKPHOS 45  BILITOT 0.8  PROT 7.4  ALBUMIN 3.7    Recent Labs Lab 10/12/15 1836  LIPASE 30   No results for input(s): AMMONIA in the last 168 hours.  CBC:  Recent Labs Lab 10/14/15 0417 10/15/15 0453 10/16/15 0204 10/17/15 0515 10/17/15 2350 10/18/15 0230  WBC 6.9 5.7 6.2 7.0  --  7.5  HGB 12.2 12.0 12.4 13.6 13.5 13.6  HCT 37.7 37.2 38.0 41.5 40.5 40.9  MCV 67.4* 66.4* 66.0* 66.7*  --  65.5*  PLT 251 234 242 249  --  274    Cardiac Enzymes:  Recent Labs Lab 10/13/15 0218 10/13/15 0523 10/13/15 0754 10/17/15 2000 10/18/15 0230  TROPONINI 4.50* 3.92* 3.22* 0.47* 0.60*    BNP: BNP (last 3 results)  Recent Labs  10/24/14 1054  BNP 685.0*    ProBNP (last 3 results) No results for input(s): PROBNP in the last 8760 hours.    Other results:  Imaging: No results found.   Medications:     Scheduled Medications: . aspirin  81 mg Oral Daily  . clopidogrel  75 mg Oral Q  breakfast  . digoxin  0.125 mg Oral Daily  . heparin  5,000 Units Subcutaneous Q8H  . insulin aspart  0-20 Units Subcutaneous TID WC  . insulin aspart  5 Units Subcutaneous TID WC  . isosorbide-hydrALAZINE  1 tablet Oral TID  . linagliptin  5 mg Oral Daily  . nicotine  14 mg Transdermal Daily  . sacubitril-valsartan  1 tablet Oral BID  . sodium chloride flush  3 mL Intravenous Q12H  . spironolactone  12.5 mg Oral Daily  . ticagrelor  90 mg Oral BID    Infusions: . nitroGLYCERIN Stopped (10/17/15 2245)    PRN Medications: sodium chloride, sodium chloride, acetaminophen, acetaminophen, albuterol, famotidine, fluticasone furoate-vilanterol, gi cocktail, morphine injection, nitroGLYCERIN, ondansetron (ZOFRAN) IV, ondansetron (ZOFRAN) IV, ondansetron (ZOFRAN) IV, oxyCODONE-acetaminophen, sodium chloride flush, zolpidem   Assessment:   1. Acute on chronic systolic HF 2. NSTEMI with CAD    --s/p PCI with DES to LAD 10/17/15 3. HTN 4. NSVT 5. Hypokalemia 6. AKI  Plan/Discussion:     She is s/p PCI with DES to LAD complicated by transient cardiogenic shock. Now feels better but has same AKI. She has extremely brittle HF. Co-ox and CVP ok this morning. Will watch closely. No role for inotropes at this point. Would hold off on b-blockers. Continue to follow in SDU. Will place LifeVest prior to d/c. Cardiac rehab to see.    Length of Stay: 5 Cathan Gearin MD  10/18/2015, 8:29 AM  Advanced Heart Failure Team Pager [phone] (M-F; 7a - 4p)  Please contact [facility] for night-coverage after hours (4p -7a ) and weekends on [email or web address]

## 2015-10-19 LAB — BASIC METABOLIC PANEL
ANION GAP: 8 (ref 5–15)
BUN: 26 mg/dL — ABNORMAL HIGH (ref 6–20)
CO2: 22 mmol/L (ref 22–32)
Calcium: 9.4 mg/dL (ref 8.9–10.3)
Chloride: 109 mmol/L (ref 101–111)
Creatinine, Ser: 1.4 mg/dL — ABNORMAL HIGH (ref 0.44–1.00)
GFR calc Af Amer: 49 mL/min — ABNORMAL LOW (ref >60)
GFR, EST NON AFRICAN AMERICAN: 43 mL/min — AB (ref >60)
GLUCOSE: 119 mg/dL — AB (ref 65–99)
POTASSIUM: 4 mmol/L (ref 3.5–5.1)
SODIUM: 139 mmol/L (ref 135–145)

## 2015-10-19 LAB — GLUCOSE, CAPILLARY
GLUCOSE-CAPILLARY: 137 mg/dL — AB (ref 65–99)
Glucose-Capillary: 173 mg/dL — ABNORMAL HIGH (ref 65–99)

## 2015-10-19 LAB — MAGNESIUM: Magnesium: 2.1 mg/dL (ref 1.7–2.4)

## 2015-10-19 LAB — CARBOXYHEMOGLOBIN
CARBOXYHEMOGLOBIN: 0.8 % (ref 0.5–1.5)
METHEMOGLOBIN: 1.1 % (ref 0.0–1.5)
O2 Saturation: 69.5 %
TOTAL HEMOGLOBIN: 13.5 g/dL (ref 12.0–16.0)

## 2015-10-19 LAB — MRSA PCR SCREENING: MRSA by PCR: NEGATIVE

## 2015-10-19 MED ORDER — ATORVASTATIN CALCIUM 80 MG PO TABS: 80.0000 mg | ORAL_TABLET | Freq: Every day | ORAL | Status: DC

## 2015-10-19 MED ORDER — NICOTINE 14 MG/24HR TD PT24: 14.0000 mg | MEDICATED_PATCH | Freq: Every day | TRANSDERMAL | Status: DC

## 2015-10-19 MED ORDER — DIGOXIN 125 MCG PO TABS: 0.1250 mg | ORAL_TABLET | Freq: Every day | ORAL | Status: DC

## 2015-10-19 MED ORDER — FUROSEMIDE 20 MG PO TABS: 20.0000 mg | ORAL_TABLET | Freq: Every day | ORAL | Status: DC

## 2015-10-19 MED ORDER — SPIRONOLACTONE 25 MG PO TABS: 12.5000 mg | ORAL_TABLET | Freq: Every day | ORAL | Status: DC

## 2015-10-19 MED ORDER — TICAGRELOR 90 MG PO TABS: 90.0000 mg | ORAL_TABLET | Freq: Two times a day (BID) | ORAL | Status: DC

## 2015-10-19 MED ORDER — NITROGLYCERIN 0.4 MG SL SUBL: 0.4000 mg | SUBLINGUAL_TABLET | SUBLINGUAL | Status: DC | PRN

## 2015-10-19 MED ORDER — ISOSORB DINITRATE-HYDRALAZINE 20-37.5 MG PO TABS: 1.0000 | ORAL_TABLET | Freq: Three times a day (TID) | ORAL | Status: DC

## 2015-10-19 MED ORDER — SACUBITRIL-VALSARTAN 24-26 MG PO TABS: 1.0000 | ORAL_TABLET | Freq: Two times a day (BID) | ORAL | Status: DC

## 2015-10-19 NOTE — Progress Notes (Signed)
   10/19/15 1000  Clinical Encounter Type  Visited With Family  Visit Type Follow-up  Referral From Chaplain  Consult/Referral To Chaplain  Spiritual Encounters  Spiritual Needs Emotional  Stress Factors  Family Stress Factors Exhausted;Financial concerns  CHP visited with patient's wife who has been staying at the hospital 24/7, is diabetic and can't read. Family member needed help locating and reading her medicine pack. CHP provided support and ministry of presence.  CHP will check on additional food vouchers and/or snacks. Rodney Booze 10/19/2015

## 2015-10-19 NOTE — Progress Notes (Signed)
Pt is preparing for d/c. HR 116 ST at rest. Ed completed/reviewed with good reception although she is quite overwhelmed. Reinforced Brilinta, smoking cessation, ex gl, NTG, daily wts, low sodium, and CRPII. Will send referral to [facility] CRPII. [id number] Ethelda Chick CES, ACSM 12:12 PM 10/19/2015

## 2015-10-19 NOTE — Discharge Summary (Signed)
Physician Discharge Summary   Patient ID: Sandra Owens MRN: [id number] DOB/AGE: 52-Jan-1967 52 y.o.  Admit date: 10/12/2015 Discharge date: 10/19/2015  Primary Care Physician:  Allean Found, MD  Discharge Diagnoses:   . NSTEMI (non-ST elevated myocardial infarction) [facility]) with CAD status post PCI with DES LAD . Transient cardiogenic shock, resolved  . Acute on Chronic systolic CHF  . Essential hypertension . GERD (gastroesophageal reflux disease) . NSVT . acute kidney injury   Diabetes mellitus  Consults:  Cardiology   Outpatient Follow-up:  1. Patient will need outpatient sleep study and PFTs 2. Please repeat CBC/BMET at next visit   DIET: Heart healthy diet, carb modified    Allergies:  No Known Allergies   DISCHARGE MEDICATIONS: Current Discharge Medication List    START taking these medications   Details  atorvastatin (LIPITOR) 80 MG tablet Take 1 tablet (80 mg total) by mouth daily at 6 PM. Qty: 30 tablet, Refills: 6    digoxin (LANOXIN) 0.125 MG tablet Take 1 tablet (0.125 mg total) by mouth daily. Qty: 30 tablet, Refills: 3    furosemide (LASIX) 20 MG tablet Take 1 tablet (20 mg total) by mouth daily. Qty: 30 tablet, Refills: 3    isosorbide-hydrALAZINE (BIDIL) 20-37.5 MG tablet Take 1 tablet by mouth 3 (three) times daily. Qty: 90 tablet, Refills: 3    nicotine (NICODERM CQ - DOSED IN MG/24 HOURS) 14 mg/24hr patch Place 1 patch (14 mg total) onto the skin daily. Qty: 28 patch, Refills: 3    nitroGLYCERIN (NITROSTAT) 0.4 MG SL tablet Place 1 tablet (0.4 mg total) under the tongue every 5 (five) minutes as needed for chest pain. Qty: 30 tablet, Refills: 12    sacubitril-valsartan (ENTRESTO) 24-26 MG Take 1 tablet by mouth 2 (two) times daily. Qty: 60 tablet, Refills: 0    spironolactone (ALDACTONE) 25 MG tablet Take 0.5 tablets (12.5 mg total) by mouth daily. Qty: 30 tablet, Refills: 3    ticagrelor (BRILINTA) 90 MG TABS tablet Take 1  tablet (90 mg total) by mouth 2 (two) times daily. Qty: 60 tablet, Refills: 0      CONTINUE these medications which have NOT CHANGED   Details  albuterol (PROVENTIL HFA;VENTOLIN HFA) 108 (90 BASE) MCG/ACT inhaler Inhale 1-2 puffs into the lungs every 4 (four) hours as needed for wheezing or shortness of breath. Qty: 1 Inhaler, Refills: 0    aspirin EC 81 MG tablet Take 1 tablet (81 mg total) by mouth daily. Qty: 90 tablet, Refills: 3    famotidine (PEPCID AC) 10 MG chewable tablet Chew 10 mg by mouth daily as needed for heartburn.    Fluticasone Furoate-Vilanterol (BREO ELLIPTA) 100-25 MCG/INH AEPB Inhale 1 puff into the lungs daily as needed (for wheezing).     glucose blood test strip 1 each by Other route See admin instructions. Check blood sugar once daily    sitaGLIPtin (JANUVIA) 100 MG tablet Take 100 mg by mouth daily.    tobramycin (TOBREX) 0.3 % ophthalmic solution Place 1-2 drops into the left eye as needed (for eye infection).      STOP taking these medications     carvedilol (COREG) 6.25 MG tablet      ibuprofen (ADVIL,MOTRIN) 200 MG tablet      telmisartan-hydrochlorothiazide (MICARDIS HCT) 80-25 MG per tablet          Brief H and P: For complete details please refer to admission H and P, but in brief Sandra Owens is a 50 y.o. female  With hypertension, diabetes, GERD presented to ED with chest pain. Patient reported that she woke up at 7 AM on the morning of admission and thought she had indigestion however that her discomfort and to new throughout the day with no relief of symptoms. Hence presented to ED. Troponin was elevated initially at 0.13 subsequently 3.72. Patient was admitted for further workup and cardiology was consulted. She was started on heparin drip. CT angiogram of the chest showed no pulmonary embolism.   Hospital Course:  NSTEMI (non-ST elevated myocardial infarction) (HCC) - 2-D echo showed EF of 15-20% with diffuse hypokinesis -  Patient underwent right and left heart cath on 6/12 which showed severe biventricular CHF EF less than 20%, severe pulmonary hypertension with PA pressure 37mm Hg. severe 95% mid LAD stenosis, new likely culprit for patient's ACS. - PCI of LAD on 6/14 complicated with transient cardiogenic shock during the interventional part of cardiac cath  - Creatinine trended up to 1.5, troponin 0.65. Creatinine has improved to 1.4 at the time of discharge. - On Brilinta and aspirin. - Per cardiology, continue aspirin, Brilinta, spironolactone, BiDil, digoxin, Lasix, Lipitor 80 mg daily. Started on [place]. - No beta blockers, will start outpatient per cardiology  Acute on chronic biventricular systolic CHF with severe pulmonary hypertension -Patient was closely followed by cardiology/CHF team, continue BiDil, entresto, digoxin and spironolactone - Start Lasix 20 mg daily  - placed LifeVest prior to discharge.  - Patient has cardiology appointment scheduled with Dr. Gala Romney  - Patient will need outpatient PFTs and sleep study   Acute kidney injury: Baseline Cr nl, Cr on admit 1.42 - Creatinine trended up to 1.5 likely due to cardiogenic shock , Now improved to 1.4 at the time of discharge - discontinued  telmisartan/HCTZ   Essential HTN - BP currently stable   GERD - Continue Pepcid  DM II Continue Januvia, follow up outpatient with PCP  Low Back Pain Tylenol 650 mg every 4 hours prn  COPD Breo prn qd, Albuterol 1-2 puffs every 4 hours when necessary wheezing or shortness of breath   Day of Discharge BP 118/70 mmHg  Pulse 51  Temp(Src) 98.3 F (36.8 C) (Oral)  Resp 20  Ht 5\' 5"  (1.651 m)  Wt 85.594 kg (188 lb 11.2 oz)  BMI 31.40 kg/m2  SpO2 97%  LMP 08/05/2015 (Approximate)  Physical Exam: General: Alert and awake oriented x3 not in any acute distress. HEENT: anicteric sclera, pupils reactive to light and accommodation CVS: S1-S2 clear no murmur rubs or gallops Chest: clear  to auscultation bilaterally, no wheezing rales or rhonchi Abdomen: soft nontender, nondistended, normal bowel sounds Extremities: no cyanosis, clubbing or edema noted bilaterally Neuro: Cranial nerves II-XII intact, no focal neurological deficits   The results of significant diagnostics from this hospitalization (including imaging, microbiology, ancillary and laboratory) are listed below for reference.    LAB RESULTS: Basic Metabolic Panel:  Recent Labs Lab 10/13/15 0218  10/18/15 0230 10/19/15 0530  NA  --   < > 138 139  K  --   < > 4.1 4.0  CL  --   < > 105 109  CO2  --   < > 22 22  GLUCOSE  --   < > 224* 119*  BUN  --   < > 24* 26*  CREATININE  --   < > 1.52* 1.40*  CALCIUM  --   < > 9.5 9.4  MG 1.9  < >  --  2.1  PHOS  2.5  --   --   --   < > = values in this interval not displayed. Liver Function Tests:  Recent Labs Lab 10/12/15 1836  AST 18  ALT 13*  ALKPHOS 45  BILITOT 0.8  PROT 7.4  ALBUMIN 3.7    Recent Labs Lab 10/12/15 1836  LIPASE 30   No results for input(s): AMMONIA in the last 168 hours. CBC:  Recent Labs Lab 10/17/15 0515 10/17/15 2350 10/18/15 0230  WBC 7.0  --  7.5  HGB 13.6 13.5 13.6  HCT 41.5 40.5 40.9  MCV 66.7*  --  65.5*  PLT 249  --  274   Cardiac Enzymes:  Recent Labs Lab 10/18/15 0230 10/18/15 0800  TROPONINI 0.60* 0.65*   BNP: Invalid input(s): POCBNP CBG:  Recent Labs Lab 10/18/15 2136 10/19/15 0747  GLUCAP 163* 137*    Significant Diagnostic Studies:  Dg Chest 2 View  10/12/2015  CLINICAL DATA:  Central chest pain, initial encounter EXAM: CHEST  2 VIEW COMPARISON:  10/24/2014 FINDINGS: Cardiac shadow is mildly enlarged. Mild central vascular congestion is noted without pulmonary edema. No sizable effusion or infiltrate is seen. No bony abnormality is noted. IMPRESSION: Mild vascular congestion without edema. Electronically Signed   By: Alcide Clever M.D.   On: 10/12/2015 16:34   Ct Angio Chest Pe W/cm &/or Wo  Cm  10/12/2015  CLINICAL DATA:  Chest pain. Hypertension. Diabetes. Short of breath and bilateral leg pain for 1 week. Elevated creatinine. Systolic congestive heart failure. EXAM: CT ANGIOGRAPHY CHEST WITH CONTRAST TECHNIQUE: Multidetector CT imaging of the chest was performed using the standard protocol during bolus administration of intravenous contrast. Multiplanar CT image reconstructions and MIPs were obtained to evaluate the vascular anatomy. CONTRAST:  80 cc of Isovue 370 COMPARISON:  Chest radiograph of earlier today.  No prior CT. FINDINGS: Mediastinum/Nodes: The quality of this exam for evaluation of pulmonary embolism is moderate to good. Despite Position and patient body habitus degradation, the bolus is relatively well timed. No evidence of pulmonary embolism. The thoracic aorta is not well opacified. No aneurysm identified. There is age advanced atherosclerosis. Moderate cardiomegaly. No mediastinal or hilar adenopathy. Lungs/Pleura: No pleural fluid. Mild degradation secondary to patient body habitus and arm position, not raised above the head. Clear lungs. Upper abdomen: Mild prominence the lateral segment left liver lobe. Possible subtle irregular hepatic capsule, including on image 90/series 4. Normal imaged portions of the spleen, stomach, pancreas, kidneys, gallbladder, right adrenal gland. Mild left adrenal thickening. Abdominal aortic atherosclerosis. Musculoskeletal: No acute osseous abnormality. Review of the MIP images confirms the above findings. IMPRESSION: 1. Mild degradation secondary to patient body habitus, arm position. No pulmonary embolism. 2. Cardiomegaly with age advanced atherosclerosis. 3. Possible mild cirrhosis. Irregular hepatic capsule could alternatively be a CT artifact specific to iterative reconstruction. Correlate with risk factors. Electronically Signed   By: Jeronimo Greaves M.D.   On: 10/12/2015 20:04    2D ECHO: Study Conclusions  - Left ventricle: The cavity  size was moderately dilated. Wall  thickness was normal. Systolic function was severely reduced. The  estimated ejection fraction was in the range of 15% to 20%.  Diffuse hypokinesis. Doppler parameters are consistent with  restrictive physiology, indicative of decreased left ventricular  diastolic compliance and/or increased left atrial pressure.  Doppler parameters are consistent with high ventricular filling  pressure. - Aortic valve: There was trivial regurgitation. Valve area (VTI):  1.56 cm^2. Valve area (Vmax): 1.59 cm^2. Valve area (Vmean):  1.86  cm^2. - Mitral valve: There was mild regurgitation. - Left atrium: The atrium was moderately dilated.  Coronary Stent Intervention 10/17/15     Conclusion    1. Ost 3rd Diag to 3rd Diag lesion, 75% stenosed. 2. Dist RCA lesion, 40% stenosed. 3. Mid Cx lesion, 70% stenosed. 4. Mid LAD lesion, 90% stenosed. Post intervention, there is a 0% residual stenosis.   Successful stent of the mid LAD from 95% to 0% using 32 x 3,0 Promus Premier post dilated to 3.25 mm in diameter.  Procedure was complicated by transient occlusion of 2 small septal perforators that led to transient cardiogenic shock requiring intravenous dopamine and IV fluid administration. Intra-aortic balloon pump was contemplated but a sheath injection demonstrated total occlusion of the right superficial femoral artery near the sheath entry site. Because of concern about potential limb ischemia, IABP was aborted. Approximately 15-20 minutes after the acute decompensation the patient was back to near normal. Dopamine was discontinued. Chest discomfort resolved.     Indications    CAD in native artery [I25.10 (ICD-10-CM)]   Acute on chronic systolic heart failure (HCC) [I50.23 (ICD-10-CM)]      Disposition and Follow-up: Discharge Instructions    (HEART FAILURE PATIENTS) Call MD:  Anytime you have any of the following symptoms: 1) 3 pound weight gain in 24  hours or 5 pounds in 1 week 2) shortness of breath, with or without a dry hacking cough 3) swelling in the hands, feet or stomach 4) if you have to sleep on extra pillows at night in order to breathe.    Complete by:  As directed      Diet - low sodium heart healthy    Complete by:  As directed      Diet Carb Modified    Complete by:  As directed      Increase activity slowly    Complete by:  As directed             DISPOSITION: HOME    DISCHARGE FOLLOW-UP Follow-up Information    Follow up with Arvilla Meres, MD. Go on 10/31/2015.   Specialty:  Cardiology   Why:  on 0920 for post hospital follow up. Please bring all of your medications to your visit. The code for parking is 0020.   Contact information:   [address] [place] [address] [phone]       Follow up with Allean Found, MD. Schedule an appointment as soon as possible for a visit in 2 weeks.   Specialty:  Family Medicine   Why:  for hospital follow-up   Contact information:   [address] [place] [address] [phone]       Follow up with Arvilla Meres, MD On 10/26/2015.   Specialty:  Cardiology   Why:  at 1100 for labs.  BMET and [employer] information:   [address] [place] [address] [phone]        Time spent on Discharge:   Signed:   RAI,RIPUDEEP M.D. [facility] 10/19/2015, 10:24 AM Pager: [phone]

## 2015-10-19 NOTE — Care Management Note (Addendum)
Case Management Note  Patient Details  Name: Mekenna Gittens MRN: [id number] Date of Birth: 07/11/1965  Subjective/Objective:                    Action/Plan: Patient discharging home with her husband. CM consult for medication assistance and benefits check for Bidil. Benefits check revealed a co pay of $70/ month. Pt given 30 day free card for Bidil and the $25/ month card after first 30 days. Pharmacy had already given the patient the [facility] packet with the card and the [place] cards. Pts life vest was in room for her to wear home. Bedside RN updated.   Expected Discharge Date:                  Expected Discharge Plan:  Home w Home Health Services  In-House Referral:     Discharge planning Services  CM Consult  Post Acute Care Choice:    Choice offered to:     DME Arranged:    DME Agency:     HH Arranged:    HH Agency:     Status of Service:  Completed, signed off  Medicare Important Message Given:    Date Medicare IM Given:    Medicare IM give by:    Date Additional Medicare IM Given:    Additional Medicare Important Message give by:     If discussed at Long Length of Stay Meetings, dates discussed:    Additional Comments:  Kermit Balo, RN 10/19/2015, 12:44 PM

## 2015-10-19 NOTE — Progress Notes (Signed)
MEDICATION RELATED CONSULT NOTE - INITIAL   Patient seen with heart failure team. Extensive discussion with patient and husband on new medications including dosing side effects, and stressed compliance.   Answered all questions. Notified to call HF clinic if more questions arise. Gave copay cards for entresto and brilinta.   Sheppard Coil PharmD., BCPS Clinical Pharmacist Pager [phone] 10/19/2015 11:14 AM

## 2015-10-19 NOTE — Care Management Note (Signed)
Case Management Note  Patient Details  Name: Sandra Owens MRN: [id number] Date of Birth: 1965-12-15  Subjective/Objective:     Sandra Owens with [employer] has submitted documentation to obtain auth for lifevest.  STD paperwork completed by Heart Failure Clinic RN, Aundra Millet, and faxed to employer.                      Expected Discharge Plan:  Home w Home Health Services  Discharge planning Services  CM Consult  Status of Service:  In process, will continue to follow  Magdalene River, RN 10/19/2015, 6:42 AM

## 2015-10-19 NOTE — Progress Notes (Signed)
Advanced Heart Failure Rounding Note   Subjective:    She has severe systolic HF with markedly elevated filling pressures and low cardiac output. However on exam looks fairly well compensated. She has also been found to have high-grade LAD lesion and is pending PCI once she is better tuned up. HF preceded development of CAD and cardiomyopathy is likely mostly non-ischemic in nature.   Underwent PCI of LAD 6/14. Complicated by transient cardiogenic shock in setting of a jailed septal perforator. Now feels better.   No CP or SOB. Co-ox 70%. Creatinine improved 1.2-> 1.5 -> 1.4.  CVP 4-5     Echo 10/13/15 LVEF 15-20% with diffuse hypokinesis, moderate LAE. Normal RV.   R/LCH 10/15/15  1. Ost 3rd Diag to 3rd Diag lesion, 75% stenosed. 2. Dist RCA lesion, 40% stenosed. 3. Mid Cx lesion, 70% stenosed. 4. Mid LAD lesion, 90% stenosed. 5. There is severe left ventricular systolic dysfunction.   Objective:   Weight Range:  Vital Signs:   Temp:  [97.8 F (36.6 C)-98.5 F (36.9 C)] 98.3 F (36.8 C) (06/16 0742) Resp:  [16-20] 20 (06/16 0742) BP: (98-129)/(66-82) 118/70 mmHg (06/16 0742) SpO2:  [95 %-100 %] 97 % (06/16 0742) Weight:  [85.594 kg (188 lb 11.2 oz)] 85.594 kg (188 lb 11.2 oz) (06/16 0600) Last BM Date: 10/18/15  Weight change: Filed Weights   10/15/15 1350 10/17/15 0533 10/19/15 0600  Weight: 90.1 kg (198 lb 10.2 oz) 85.1 kg (187 lb 9.8 oz) 85.594 kg (188 lb 11.2 oz)    Intake/Output:   Intake/Output Summary (Last 24 hours) at 10/19/15 0935 Last data filed at 10/18/15 1300  Gross per 24 hour  Intake    240 ml  Output      0 ml  Net    240 ml     Physical Exam: CVP 4-5 General:  Lying in bed No resp difficulty.Marland Kitchen  HEENT: normal Neck: supple. JVP flat . Carotids 2+ bilat; no bruits. No lymphadenopathy or thryomegaly appreciated. Cor: PMI nondisplaced. Regular rate & rhythm. No rubs, or murmurs. + S3  Lungs: clear Abdomen: soft, nontender, nondistended.  No hepatosplenomegaly. No bruits or masses. Good bowel sounds. Extremities: no cyanosis, clubbing, rash, edema. RUE PICC Neuro: alert & orientedx3, cranial nerves grossly intact. moves all 4 extremities w/o difficulty. Affect pleasant  Telemetry: NSR 90s   Labs: Basic Metabolic Panel:  Recent Labs Lab 10/13/15 0218  10/14/15 2339 10/15/15 0453 10/16/15 0204 10/17/15 0515 10/18/15 0230 10/19/15 0530  NA  --   < >  --  139 140 137 138 139  K  --   < >  --  3.9 3.3* 3.5 4.1 4.0  CL  --   < >  --  108 104 101 105 109  CO2  --   < >  --  GLUCOSE  --   < >  --  139* 154* 148* 224* 119*  BUN  --   < >  --  17 20 22* 24* 26*  CREATININE  --   < >  --  1.25* 1.34* 1.22* 1.52* 1.40*  CALCIUM  --   < >  --  9.1 9.7 9.7 9.5 9.4  MG 1.9  --  1.8  --   --   --   --  2.1  PHOS 2.5  --   --   --   --   --   --   --   < > =  values in this interval not displayed.  Liver Function Tests:  Recent Labs Lab 10/12/15 1836  AST 18  ALT 13*  ALKPHOS 45  BILITOT 0.8  PROT 7.4  ALBUMIN 3.7    Recent Labs Lab 10/12/15 1836  LIPASE 30   No results for input(s): AMMONIA in the last 168 hours.  CBC:  Recent Labs Lab 10/14/15 0417 10/15/15 0453 10/16/15 0204 10/17/15 0515 10/17/15 2350 10/18/15 0230  WBC 6.9 5.7 6.2 7.0  --  7.5  HGB 12.2 12.0 12.4 13.6 13.5 13.6  HCT 37.7 37.2 38.0 41.5 40.5 40.9  MCV 67.4* 66.4* 66.0* 66.7*  --  65.5*  PLT 251 234 242 249  --  274    Cardiac Enzymes:  Recent Labs Lab 10/13/15 0523 10/13/15 0754 10/17/15 2000 10/18/15 0230 10/18/15 0800  TROPONINI 3.92* 3.22* 0.47* 0.60* 0.65*    BNP: BNP (last 3 results)  Recent Labs  10/24/14 1054  BNP 685.0*    ProBNP (last 3 results) No results for input(s): PROBNP in the last 8760 hours.    Other results:  Imaging: No results found.   Medications:     Scheduled Medications: . aspirin  81 mg Oral Daily  . digoxin  0.125 mg Oral Daily  . heparin  5,000 Units  Subcutaneous Q8H  . insulin aspart  0-20 Units Subcutaneous TID WC  . insulin aspart  5 Units Subcutaneous TID WC  . isosorbide-hydrALAZINE  1 tablet Oral TID  . linagliptin  5 mg Oral Daily  . nicotine  14 mg Transdermal Daily  . sacubitril-valsartan  1 tablet Oral BID  . sodium chloride flush  3 mL Intravenous Q12H  . spironolactone  12.5 mg Oral Daily  . ticagrelor  90 mg Oral BID    Infusions: . nitroGLYCERIN Stopped (10/17/15 2245)    PRN Medications: sodium chloride, sodium chloride, acetaminophen, albuterol, famotidine, fluticasone furoate-vilanterol, gi cocktail, morphine injection, nitroGLYCERIN, ondansetron (ZOFRAN) IV, oxyCODONE-acetaminophen, sodium chloride flush, zolpidem   Assessment:   1. Acute on chronic systolic HF 2. NSTEMI with CAD    --s/p PCI with DES to LAD 10/17/15 3. HTN 4. NSVT 5. Hypokalemia 6. AKI  Plan/Discussion:     She is improved. Ok for d/c today with close f/u in HF clinic. Meds on d/c:  ECASA 81 daily Brilinta 90 bid Spiro 12.5 mg daily Bidil 1 tab tid Entresto 24/26 bid Digoxin 0.125  Lasix 20 daily Atorva 80 daily  No b-blocker yet. Will need outpatient cardiac rehab and sleep study.   Length of Stay: 6 Kelcey Wickstrom MD  10/19/2015, 9:35 AM  Advanced Heart Failure Team Pager [phone] (M-F; 7a - 4p)  Please contact [facility] for night-coverage after hours (4p -7a ) and weekends on [email or web address]

## 2015-10-26 ENCOUNTER — Ambulatory Visit (HOSPITAL_COMMUNITY)
Admission: RE | Admit: 2015-10-26 | Discharge: 2015-10-26 | Disposition: A | Discharge status: Home / Self Care | Payer: Managed Care, Other (non HMO) | Source: Ambulatory Visit | Attending: Cardiology | Admitting: Cardiology

## 2015-10-26 DIAGNOSIS — I5023 Acute on chronic systolic (congestive) heart failure: Secondary | ICD-10-CM | POA: Insufficient documentation

## 2015-10-26 LAB — BASIC METABOLIC PANEL
Anion gap: 9 (ref 5–15)
BUN: 15 mg/dL (ref 6–20)
CO2: 21 mmol/L — ABNORMAL LOW (ref 22–32)
CREATININE: 1.32 mg/dL — AB (ref 0.44–1.00)
Calcium: 9.8 mg/dL (ref 8.9–10.3)
Chloride: 108 mmol/L (ref 101–111)
GFR calc Af Amer: 53 mL/min — ABNORMAL LOW (ref >60)
GFR, EST NON AFRICAN AMERICAN: 46 mL/min — AB (ref >60)
Glucose, Bld: 146 mg/dL — ABNORMAL HIGH (ref 65–99)
POTASSIUM: 4.1 mmol/L (ref 3.5–5.1)
SODIUM: 138 mmol/L (ref 135–145)

## 2015-10-26 LAB — DIGOXIN LEVEL: DIGOXIN LVL: 0.2 ng/mL — AB (ref 0.8–2.0)

## 2015-10-29 ENCOUNTER — Other Ambulatory Visit (HOSPITAL_COMMUNITY): Payer: Self-pay | Admitting: Respiratory Therapy

## 2015-10-29 DIAGNOSIS — R0609 Other forms of dyspnea: Secondary | ICD-10-CM

## 2015-10-29 DIAGNOSIS — R0600 Dyspnea, unspecified: Secondary | ICD-10-CM

## 2015-10-30 ENCOUNTER — Telehealth (HOSPITAL_COMMUNITY): Payer: Self-pay | Admitting: Pharmacist

## 2015-10-30 NOTE — Telephone Encounter (Signed)
Entresto 24-26 mg BID PA approved by [employer] through 10/17/16.   Tyler Deis. Bonnye Fava, PharmD, BCPS, CPP Clinical Pharmacist Pager: [phone] Phone: [phone] 10/30/2015 11:03 AM

## 2015-10-31 ENCOUNTER — Encounter (HOSPITAL_COMMUNITY): Payer: Self-pay | Admitting: Internal Medicine

## 2015-10-31 ENCOUNTER — Ambulatory Visit (HOSPITAL_COMMUNITY)
Admission: RE | Admit: 2015-10-31 | Discharge: 2015-10-31 | Disposition: A | Discharge status: Home / Self Care | Payer: Managed Care, Other (non HMO) | Source: Ambulatory Visit | Attending: Internal Medicine | Admitting: Internal Medicine

## 2015-10-31 VITALS — BP 120/62 | HR 115 | Ht 65.0 in | Wt 187.0 lb

## 2015-10-31 DIAGNOSIS — Z833 Family history of diabetes mellitus: Secondary | ICD-10-CM | POA: Insufficient documentation

## 2015-10-31 DIAGNOSIS — Z87891 Personal history of nicotine dependence: Secondary | ICD-10-CM | POA: Insufficient documentation

## 2015-10-31 DIAGNOSIS — I214 Non-ST elevation (NSTEMI) myocardial infarction: Secondary | ICD-10-CM | POA: Insufficient documentation

## 2015-10-31 DIAGNOSIS — I5022 Chronic systolic (congestive) heart failure: Secondary | ICD-10-CM | POA: Diagnosis not present

## 2015-10-31 DIAGNOSIS — I272 Other secondary pulmonary hypertension: Secondary | ICD-10-CM | POA: Insufficient documentation

## 2015-10-31 DIAGNOSIS — F329 Major depressive disorder, single episode, unspecified: Secondary | ICD-10-CM | POA: Insufficient documentation

## 2015-10-31 DIAGNOSIS — I25110 Atherosclerotic heart disease of native coronary artery with unstable angina pectoris: Secondary | ICD-10-CM | POA: Insufficient documentation

## 2015-10-31 DIAGNOSIS — G4733 Obstructive sleep apnea (adult) (pediatric): Secondary | ICD-10-CM | POA: Insufficient documentation

## 2015-10-31 DIAGNOSIS — Z7982 Long term (current) use of aspirin: Secondary | ICD-10-CM | POA: Diagnosis not present

## 2015-10-31 DIAGNOSIS — F419 Anxiety disorder, unspecified: Secondary | ICD-10-CM | POA: Insufficient documentation

## 2015-10-31 DIAGNOSIS — Z6831 Body mass index (BMI) 31.0-31.9, adult: Secondary | ICD-10-CM | POA: Diagnosis not present

## 2015-10-31 DIAGNOSIS — E669 Obesity, unspecified: Secondary | ICD-10-CM | POA: Diagnosis not present

## 2015-10-31 DIAGNOSIS — I429 Cardiomyopathy, unspecified: Secondary | ICD-10-CM | POA: Diagnosis not present

## 2015-10-31 DIAGNOSIS — Z955 Presence of coronary angioplasty implant and graft: Secondary | ICD-10-CM | POA: Insufficient documentation

## 2015-10-31 DIAGNOSIS — Z823 Family history of stroke: Secondary | ICD-10-CM | POA: Insufficient documentation

## 2015-10-31 DIAGNOSIS — I739 Peripheral vascular disease, unspecified: Secondary | ICD-10-CM | POA: Insufficient documentation

## 2015-10-31 DIAGNOSIS — K219 Gastro-esophageal reflux disease without esophagitis: Secondary | ICD-10-CM | POA: Insufficient documentation

## 2015-10-31 DIAGNOSIS — I252 Old myocardial infarction: Secondary | ICD-10-CM | POA: Insufficient documentation

## 2015-10-31 DIAGNOSIS — E119 Type 2 diabetes mellitus without complications: Secondary | ICD-10-CM | POA: Diagnosis not present

## 2015-10-31 DIAGNOSIS — Z8249 Family history of ischemic heart disease and other diseases of the circulatory system: Secondary | ICD-10-CM | POA: Diagnosis not present

## 2015-10-31 DIAGNOSIS — I110 Hypertensive heart disease with heart failure: Secondary | ICD-10-CM | POA: Insufficient documentation

## 2015-10-31 LAB — BASIC METABOLIC PANEL
Anion gap: 9 (ref 5–15)
BUN: 15 mg/dL (ref 6–20)
CHLORIDE: 110 mmol/L (ref 101–111)
CO2: 18 mmol/L — ABNORMAL LOW (ref 22–32)
CREATININE: 1.16 mg/dL — AB (ref 0.44–1.00)
Calcium: 9.6 mg/dL (ref 8.9–10.3)
GFR calc Af Amer: >60 mL/min (ref >60)
GFR, EST NON AFRICAN AMERICAN: 54 mL/min — AB (ref >60)
Glucose, Bld: 144 mg/dL — ABNORMAL HIGH (ref 65–99)
Potassium: 4.1 mmol/L (ref 3.5–5.1)
SODIUM: 137 mmol/L (ref 135–145)

## 2015-10-31 LAB — CBC
HEMATOCRIT: 39.2 % (ref 36.0–46.0)
Hemoglobin: 13 g/dL (ref 12.0–15.0)
MCH: 21.8 pg — ABNORMAL LOW (ref 26.0–34.0)
MCHC: 33.2 g/dL (ref 30.0–36.0)
MCV: 65.8 fL — ABNORMAL LOW (ref 78.0–100.0)
Platelets: 340 10*3/uL (ref 150–400)
RBC: 5.96 MIL/uL — ABNORMAL HIGH (ref 3.87–5.11)
RDW: 14.8 % (ref 11.5–15.5)
WBC: 5.8 10*3/uL (ref 4.0–10.5)

## 2015-10-31 LAB — BRAIN NATRIURETIC PEPTIDE: B NATRIURETIC PEPTIDE 5: 199.1 pg/mL — AB (ref 0.0–100.0)

## 2015-10-31 MED ORDER — CITALOPRAM HYDROBROMIDE 10 MG PO TABS: 10.0000 mg | ORAL_TABLET | Freq: Every day | ORAL | Status: DC

## 2015-10-31 MED ORDER — SACUBITRIL-VALSARTAN 49-51 MG PO TABS: 1.0000 | ORAL_TABLET | Freq: Two times a day (BID) | ORAL | Status: DC

## 2015-10-31 MED ORDER — CARVEDILOL 3.125 MG PO TABS: 3.1250 mg | ORAL_TABLET | Freq: Two times a day (BID) | ORAL | Status: DC

## 2015-10-31 NOTE — Progress Notes (Signed)
.  Patient ID: Sandra Owens, female   DOB: May 31, 1965, 50 y.o.   MRN: [id number] . PCP: Primary Cardiologist:  HPI:  50 y/o women with obesity, HTN, DM2, chronic systolic HF due to NICM (diagnosed 7/16).   Admitted 6/17 with NSTEMI and decompensated HF. Echo showed EF of 15-20% with diffuse hypokinesis. Had right and left heart cath on 10/1215 which showed severe biventricular CHF EF less than 20%, severe pulmonary hypertension with PA pressure 17mm Hg. severe 95% mid LAD stenosis, new likely culprit for patient's ACS. Had staged PCI of LAD on 6/14 complicated with transient cardiogenic shock during the interventional part of cardiac cath du to jailing of a septal branch   Presents for post-hospital follow-up. Feel ok Doing stuff around the house without too much problem. Weight stable. But walking limited by bilateral calf pain. No orthopnea, PND, edema. Stopped smoking. Using vapes occasionally and patches. Very tired. PCP ordered sleep study. Struggling with anxiety and depression. SBP at home 130-160. Taking all meds. Still wearing LifeVest  Cath 6/17  LAD 90% LCX 70% RCA 40%   RA 20 RV 67/22 PA 76/33 (53) PCWP 44 CO/CI = 3.3/1.7   ROS: All systems negative except as listed in HPI, PMH and Problem List.  SH:  Social History   Social History  . Marital Status: Single    Spouse Name: N/A  . Number of Children: 3  . Years of Education: N/A   Occupational History  . Not on file.   Social History Main Topics  . Smoking status: Former Smoker -- 0.50 packs/day for 30 years    Types: Cigarettes    Quit date: 10/12/2015  . Smokeless tobacco: Not on file  . Alcohol Use: No  . Drug Use: No  . Sexual Activity: Not on file   Other Topics Concern  . 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         FH:  Family History  Problem Relation Age of Onset  . Hypertension Father   . Diabetes Mellitus II Father   .  Hypertension Mother   . Diabetes Mellitus II Mother   . CAD Father     CABG in his 44s  . Stroke Paternal Grandmother     Past Medical History  Diagnosis Date  . Essential hypertension   . Type 2 diabetes mellitus (HCC)   . GERD (gastroesophageal reflux disease)   . Low back pain   . Systolic CHF (HCC)     a. Found to have new low EF 11/2014 with abnormal nuc.    Current Outpatient Prescriptions  Medication Sig Dispense Refill  . aspirin EC 81 MG tablet Take 1 tablet (81 mg total) by mouth daily. 90 tablet 3  . atorvastatin (LIPITOR) 80 MG tablet Take 1 tablet (80 mg total) by mouth daily at 6 PM. 30 tablet 6  . digoxin (LANOXIN) 0.125 MG tablet Take 1 tablet (0.125 mg total) by mouth daily. 30 tablet 3  . famotidine (PEPCID AC) 10 MG chewable tablet Chew 10 mg by mouth daily as needed for heartburn.    . Fluticasone Furoate-Vilanterol (BREO ELLIPTA) 100-25 MCG/INH AEPB Inhale 1 puff into the lungs daily as needed (for wheezing).     . furosemide (LASIX) 20 MG tablet Take 1 tablet (20 mg total) by mouth daily. 30 tablet 3  . glucose blood test strip 1 each by Other route See admin instructions. Check  blood sugar once daily    . isosorbide-hydrALAZINE (BIDIL) 20-37.5 MG tablet Take 1 tablet by mouth 3 (three) times daily. 90 tablet 3  . nicotine (NICODERM CQ - DOSED IN MG/24 HOURS) 14 mg/24hr patch Place 1 patch (14 mg total) onto the skin daily. 28 patch 3  . nitroGLYCERIN (NITROSTAT) 0.4 MG SL tablet Place 1 tablet (0.4 mg total) under the tongue every 5 (five) minutes as needed for chest pain. 30 tablet 12  . sacubitril-valsartan (ENTRESTO) 24-26 MG Take 1 tablet by mouth 2 (two) times daily. 60 tablet 0  . sitaGLIPtin (JANUVIA) 100 MG tablet Take 100 mg by mouth daily.    Marland Kitchen spironolactone (ALDACTONE) 25 MG tablet Take 0.5 tablets (12.5 mg total) by mouth daily. 30 tablet 3  . ticagrelor (BRILINTA) 90 MG TABS tablet Take 1 tablet (90 mg total) by mouth 2 (two) times daily. 60 tablet  0  . tobramycin (TOBREX) 0.3 % ophthalmic solution Place 1-2 drops into the left eye as needed (for eye infection).    Marland Kitchen albuterol (PROVENTIL HFA;VENTOLIN HFA) 108 (90 BASE) MCG/ACT inhaler Inhale 1-2 puffs into the lungs every 4 (four) hours as needed for wheezing or shortness of breath. (Patient not taking: Reported on 10/31/2015) 1 Inhaler 0   No current facility-administered medications for this encounter.    Filed Vitals:   10/31/15 0923  BP: 120/62  Pulse: 115  Height:  (1.651 m)  Weight: 187 lb (84.823 kg)  SpO2: 100%    PHYSICAL EXAM:  General:  Well appearing. No resp difficulty HEENT: normal Neck: supple. JVP flat. Carotids 2+ bilaterally; no bruits. No lymphadenopathy or thryomegaly appreciated. Cor: PMI normal. Tachycardia regular. No rubs, gallops or murmurs. Lungs: clear Abdomen: obese soft, nontender, nondistended. No hepatosplenomegaly. No bruits or masses. Good bowel sounds. Extremities: no cyanosis, clubbing, rash, edema Neuro: alert & orientedx3, cranial nerves grossly intact. Moves all 4 extremities w/o difficulty. Affect pleasant.   ASSESSMENT & PLAN: 1. Chronic systolic heart failure  - due mostly to NICM. EF 15-20% with biventricular dysfunction.  2. CAD s/p NSTEMI 6/17   -- s/p DES to LAD 3. Tobacco abuse 4. OSA 5. DM2 6. Claudication  7. Depression   Doing ok. NYHA II-III. Volume status ok. HR remains fast. Also struggling with claduication and depression/anxiety.   --Start carvedilol 3.125 bid --Increase Entresto to 49/51 --Start Celexa 10 --Continue ASA/Brilinta and statin --Schedule ABI and LE u/s --F/u with sleep study,  --Refer cardiac rehab.  --Continue LifeVest for now. Will repeat in 3 months.  Bensimhon, Daniel,MD 10:25 AM

## 2015-10-31 NOTE — Patient Instructions (Signed)
START Celexa 10 mg once daily.  START Carvedilol (Coreg) 3.125 mg tablet twice daily.  INCREASE Entresto to 49/51 mg tablet twice daily.  Routine lab work today. Will notify you of abnormal results, otherwise no news is good news!  Will refer you to Cardiac Rehab at [facility]. They will call you to set up initial appointment.  _____________________________________________  _____________________________________________  Will schedule you for ABI vascular study at [facility] office. Address: [address], [place], [place] [address]  Phone: [phone]  _____________________________________________  _____________________________________________  Return in 2 weeks to see CHF clinical pharmacist Cicero Duck.  Follow up 4 weeks with Dr. Leory Plowman.  Do the following things EVERYDAY: 1) Weigh yourself in the morning before breakfast. Write it down and keep it in a log. 2) Take your medicines as prescribed 3) Eat low salt foods-Limit salt (sodium) to 2000 mg per day.  4) Stay as active as you can everyday 5) Limit all fluids for the day to less than 2 liters

## 2015-11-02 ENCOUNTER — Other Ambulatory Visit (HOSPITAL_COMMUNITY): Payer: Self-pay | Admitting: Internal Medicine

## 2015-11-02 ENCOUNTER — Ambulatory Visit (HOSPITAL_COMMUNITY)
Admission: RE | Admit: 2015-11-02 | Discharge: 2015-11-02 | Disposition: A | Discharge status: Home / Self Care | Payer: Managed Care, Other (non HMO) | Source: Ambulatory Visit | Attending: Family Medicine | Admitting: Family Medicine

## 2015-11-02 DIAGNOSIS — R0609 Other forms of dyspnea: Secondary | ICD-10-CM | POA: Insufficient documentation

## 2015-11-02 DIAGNOSIS — I739 Peripheral vascular disease, unspecified: Secondary | ICD-10-CM

## 2015-11-02 LAB — PULMONARY FUNCTION TEST
DL/VA % PRED: 83 %
DL/VA: 4.02 ml/min/mmHg/L
DLCO UNC: 13.88 ml/min/mmHg
DLCO unc % pred: 57 %
FEF 25-75 Post: 1.77 L/sec
FEF 25-75 Pre: 1.57 L/sec
FEF2575-%Change-Post: 12 %
FEF2575-%Pred-Post: 74 %
FEF2575-%Pred-Pre: 65 %
FEV1-%CHANGE-POST: 3 %
FEV1-%PRED-PRE: 72 %
FEV1-%Pred-Post: 74 %
FEV1-POST: 1.71 L
FEV1-Pre: 1.66 L
FEV1FVC-%Change-Post: 2 %
FEV1FVC-%Pred-Pre: 99 %
FEV6-%Change-Post: 1 %
FEV6-%PRED-POST: 74 %
FEV6-%PRED-PRE: 73 %
FEV6-POST: 2.08 L
FEV6-PRE: 2.05 L
FEV6FVC-%CHANGE-POST: 0 %
FEV6FVC-%PRED-POST: 103 %
FEV6FVC-%PRED-PRE: 102 %
FVC-%Change-Post: 1 %
FVC-%PRED-PRE: 72 %
FVC-%Pred-Post: 72 %
FVC-POST: 2.08 L
FVC-Pre: 2.06 L
PRE FEV6/FVC RATIO: 100 %
Post FEV1/FVC ratio: 82 %
Post FEV6/FVC ratio: 100 %
Pre FEV1/FVC ratio: 80 %
RV % PRED: 105 %
RV: 1.91 L
TLC % PRED: 82 %
TLC: 4.14 L

## 2015-11-02 MED ORDER — ALBUTEROL SULFATE (2.5 MG/3ML) 0.083% IN NEBU
2.5000 mg | INHALATION_SOLUTION | Freq: Once | RESPIRATORY_TRACT | Status: AC
  Administered 2015-11-02: 2.5 mg via RESPIRATORY_TRACT

## 2015-11-08 ENCOUNTER — Ambulatory Visit (HOSPITAL_COMMUNITY)
Admission: RE | Admit: 2015-11-08 | Discharge: 2015-11-08 | Disposition: A | Discharge status: Home / Self Care | Payer: Managed Care, Other (non HMO) | Source: Ambulatory Visit | Attending: Cardiovascular Disease | Admitting: Cardiovascular Disease

## 2015-11-08 DIAGNOSIS — E119 Type 2 diabetes mellitus without complications: Secondary | ICD-10-CM | POA: Diagnosis not present

## 2015-11-08 DIAGNOSIS — K219 Gastro-esophageal reflux disease without esophagitis: Secondary | ICD-10-CM | POA: Insufficient documentation

## 2015-11-08 DIAGNOSIS — I739 Peripheral vascular disease, unspecified: Secondary | ICD-10-CM

## 2015-11-08 DIAGNOSIS — I5040 Unspecified combined systolic (congestive) and diastolic (congestive) heart failure: Secondary | ICD-10-CM | POA: Diagnosis not present

## 2015-11-08 DIAGNOSIS — R938 Abnormal findings on diagnostic imaging of other specified body structures: Secondary | ICD-10-CM | POA: Diagnosis not present

## 2015-11-08 DIAGNOSIS — I110 Hypertensive heart disease with heart failure: Secondary | ICD-10-CM | POA: Insufficient documentation

## 2015-11-14 ENCOUNTER — Ambulatory Visit (HOSPITAL_COMMUNITY)
Admission: RE | Admit: 2015-11-14 | Discharge: 2015-11-14 | Disposition: A | Discharge status: Home / Self Care | Payer: Managed Care, Other (non HMO) | Source: Ambulatory Visit | Attending: Cardiology | Admitting: Cardiology

## 2015-11-14 DIAGNOSIS — I252 Old myocardial infarction: Secondary | ICD-10-CM | POA: Diagnosis not present

## 2015-11-14 DIAGNOSIS — I428 Other cardiomyopathies: Secondary | ICD-10-CM | POA: Diagnosis not present

## 2015-11-14 DIAGNOSIS — Z7902 Long term (current) use of antithrombotics/antiplatelets: Secondary | ICD-10-CM | POA: Insufficient documentation

## 2015-11-14 DIAGNOSIS — Z79899 Other long term (current) drug therapy: Secondary | ICD-10-CM | POA: Diagnosis not present

## 2015-11-14 DIAGNOSIS — F329 Major depressive disorder, single episode, unspecified: Secondary | ICD-10-CM | POA: Diagnosis not present

## 2015-11-14 DIAGNOSIS — I5022 Chronic systolic (congestive) heart failure: Secondary | ICD-10-CM | POA: Insufficient documentation

## 2015-11-14 DIAGNOSIS — F17210 Nicotine dependence, cigarettes, uncomplicated: Secondary | ICD-10-CM | POA: Insufficient documentation

## 2015-11-14 DIAGNOSIS — E119 Type 2 diabetes mellitus without complications: Secondary | ICD-10-CM | POA: Diagnosis not present

## 2015-11-14 DIAGNOSIS — Z955 Presence of coronary angioplasty implant and graft: Secondary | ICD-10-CM | POA: Insufficient documentation

## 2015-11-14 DIAGNOSIS — Z6830 Body mass index (BMI) 30.0-30.9, adult: Secondary | ICD-10-CM | POA: Diagnosis not present

## 2015-11-14 DIAGNOSIS — I110 Hypertensive heart disease with heart failure: Secondary | ICD-10-CM | POA: Diagnosis present

## 2015-11-14 DIAGNOSIS — I2510 Atherosclerotic heart disease of native coronary artery without angina pectoris: Secondary | ICD-10-CM | POA: Diagnosis not present

## 2015-11-14 DIAGNOSIS — I739 Peripheral vascular disease, unspecified: Secondary | ICD-10-CM | POA: Diagnosis not present

## 2015-11-14 DIAGNOSIS — Z7982 Long term (current) use of aspirin: Secondary | ICD-10-CM | POA: Diagnosis not present

## 2015-11-14 DIAGNOSIS — E669 Obesity, unspecified: Secondary | ICD-10-CM | POA: Insufficient documentation

## 2015-11-14 DIAGNOSIS — Z7984 Long term (current) use of oral hypoglycemic drugs: Secondary | ICD-10-CM | POA: Insufficient documentation

## 2015-11-14 MED ORDER — SACUBITRIL-VALSARTAN 49-51 MG PO TABS: 1.0000 | ORAL_TABLET | Freq: Two times a day (BID) | ORAL | Status: DC

## 2015-11-14 MED ORDER — TICAGRELOR 90 MG PO TABS: 90.0000 mg | ORAL_TABLET | Freq: Two times a day (BID) | ORAL | Status: DC

## 2015-11-14 NOTE — Patient Instructions (Signed)
INCREASE Enrtesto to 49/51 mg, one tab twice a day  Labs needed in 1-2 weeks  Do the following things EVERYDAY: 1) Weigh yourself in the morning before breakfast. Write it down and keep it in a log. 2) Take your medicines as prescribed 3) Eat low salt foods-Limit salt (sodium) to 2000 mg per day.  4) Stay as active as you can everyday 5) Limit all fluids for the day to less than 2 liters 6)

## 2015-11-14 NOTE — Progress Notes (Signed)
HPI:  51 y/o AA female with obesity, HTN, DM2, chronic systolic HF due to NICM (diagnosed 7/16).   Admitted 6/17 with NSTEMI and decompensated HF. Echo showed EF of 15-20% with diffuse hypokinesis. Had right and left heart cath on 10/1215 which showed severe biventricular CHF EF less than 20%, severe pulmonary hypertension with PA pressure 6mm Hg. severe 95% mid LAD stenosis, new likely culprit for patient's ACS. Had staged PCI of LAD on 6/14 complicated with transient cardiogenic shock during the interventional part of cardiac cath du to jailing of a septal branch.   Presents today for pharmacist-led HF medication titration visit. At last HF clinic visit on 6/28, carvedilol was started at 3.125 mg PO BID and Entresto was to be increased to 49-51 mg PO BID. She admits to not increasing the dose since her pharmacy told her she needed refills (likely needed new PA). Feels ok today but admits to getting SOB sometimes with normal ADLs including showering and dressing. Weight is down 3 lbs today and she contributes this to the improvement in her diet. She did admit to "cheating" on her smoking cessation and states that she has smoked 4 cigarettes in the past month but she is continuing to work toward complete cessation and this is much improved from her 10 cigarette/day habit prior to her MI. Still wearing LifeVest. Still waiting to hear from cardiac rehab.     . Shortness of breath/dyspnea on exertion? Yes - when doing ADLs like showering . Orthopnea/PND? No . Edema? No . Lightheadedness/dizziness? No . Daily weights at home? Yes - stable around 185 lbs . Blood pressure/heart rate monitoring at home? Yes - SBPs 130-140s  . Following low-sodium/fluid-restricted diet? Yes - using salt substitute, fluid intake <2 L   HF Medications: Carvedilol 3.125 mg PO BID  Digoxin 0.125 mg PO daily Furosemide 20 mg PO daily Bidil (20-37.5 mg) 1 tablet PO TID Entresto 24-26 mg PO BID Spironolactone 12.5 mg PO daily     Has the patient been experiencing any side effects to the medications prescribed?  no  Does the patient have any problems obtaining medications due to transportation or finances?   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 Patient understands to avoid decongestants.    Pertinent Lab Values: . 10/31/15: Serum creatinine 1.16 (BL ~1.1), CO2 18, Potassium 4.1, Sodium 137, BNP 199 . 10/26/15: Digoxin <0.2 . 10/19/15: Magnesium 2.1  Vital Signs: . Weight: 184.4 lb (dry weight: 188 lb) . Blood pressure: 148/58 mmHg  . Heart rate: 84 bpm   Assessment: 1. Chronic systolic heart failure. 10/13/15 EF 15-20% with biventricular dysfunction. NYHA class III - Volume status looks good on exam - Continue carvedilol 3.125 mg BID, digoxin 0.125 mg daily, furosemide 20 mg daily, Bidil 1 tab TID, spironolactone 12.5 mg daily - Increase Entresto to 49-51 mg PO BID (will follow up with prior auth and give samples today until approved) - Basic disease state pathophysiology, medication indication, mechanism and side effects reviewed at length with patient and she verbalized understanding 2. CAD s/p NSTEMI 6/17 -- s/p DES to LAD - Continue ASA 81 mg daily, Brilinta 90 mg BID, atorvastatin 80 mg daily, carvedilol as above 3. Tobacco abuse - Congratulated on cutting down and offered recommendations for avoiding triggers  4. OSA - Referred for sleep study 5. DM2 - Continue Januvia per PCP 6. Claudication  7. Depression - Continue  citalopram 10 mg daily which seems to be making her feel better already  Plan: 1) Medication changes: Based on clinical presentation, vital signs and recent labs, will increase Entresto to 49-51 mg PO BID 2) Labs: BMET in 10 days  3) Follow-up: Dr. Gala Romney on 8/3    Michaelyn Wall K. Bonnye Fava, PharmD, BCPS, CPP Clinical Pharmacist Pager: [phone] Phone:  [phone] 11/14/2015 3:18 PM

## 2015-11-15 ENCOUNTER — Telehealth (HOSPITAL_COMMUNITY): Payer: Self-pay | Admitting: Pharmacist

## 2015-11-15 NOTE — Telephone Encounter (Signed)
Entresto PA not required by [employer] verified $0 copay.   Sandra Owens. Bonnye Fava, PharmD, BCPS, CPP Clinical Pharmacist Pager: [phone] Phone: [phone] 11/15/2015 3:59 PM

## 2015-11-21 ENCOUNTER — Encounter (HOSPITAL_COMMUNITY): Payer: Self-pay

## 2015-11-21 NOTE — Progress Notes (Signed)
FMLA dated 10/23/15 completed on behalf of patient's primary caregiver (daughter Juliet Rude "Garlan Fair" Clare Gandy). Forms faxed to provided # Attn: Lurlean Horns at [phone] Copy of forms scanned into electronic medical record.  Ave Filter

## 2015-11-23 ENCOUNTER — Telehealth (HOSPITAL_COMMUNITY): Payer: Self-pay | Admitting: *Deleted

## 2015-11-23 ENCOUNTER — Ambulatory Visit (HOSPITAL_COMMUNITY)
Admission: RE | Admit: 2015-11-23 | Discharge: 2015-11-23 | Disposition: A | Discharge status: Home / Self Care | Payer: Managed Care, Other (non HMO) | Source: Ambulatory Visit | Attending: Cardiology | Admitting: Cardiology

## 2015-11-23 DIAGNOSIS — I5023 Acute on chronic systolic (congestive) heart failure: Secondary | ICD-10-CM | POA: Diagnosis not present

## 2015-11-23 DIAGNOSIS — I739 Peripheral vascular disease, unspecified: Secondary | ICD-10-CM

## 2015-11-23 DIAGNOSIS — I5022 Chronic systolic (congestive) heart failure: Secondary | ICD-10-CM | POA: Diagnosis not present

## 2015-11-23 LAB — BASIC METABOLIC PANEL
Anion gap: 8 (ref 5–15)
BUN: 10 mg/dL (ref 6–20)
CO2: 20 mmol/L — ABNORMAL LOW (ref 22–32)
Calcium: 9.3 mg/dL (ref 8.9–10.3)
Chloride: 111 mmol/L (ref 101–111)
Creatinine, Ser: 1.17 mg/dL — ABNORMAL HIGH (ref 0.44–1.00)
GFR calc non Af Amer: 53 mL/min — ABNORMAL LOW (ref >60)
Glucose, Bld: 135 mg/dL — ABNORMAL HIGH (ref 65–99)
POTASSIUM: 3.9 mmol/L (ref 3.5–5.1)
SODIUM: 139 mmol/L (ref 135–145)

## 2015-11-23 NOTE — Telephone Encounter (Signed)
-----   Message from Dolores Patty, MD sent at 11/10/2015  8:10 AM EDT ----- Please refer to Dr. Kirke Corin

## 2015-11-23 NOTE — Telephone Encounter (Signed)
Notes Recorded by Noralee Space, RN on 11/23/2015 at 4:40 PM Patient aware. Referral placed for Dr Kirke Corin

## 2015-11-29 ENCOUNTER — Telehealth: Payer: Self-pay | Admitting: Cardiovascular Disease

## 2015-11-29 NOTE — Telephone Encounter (Signed)
New message ° ° ° ° ° ° °Pt returning nurse call  °

## 2015-11-30 ENCOUNTER — Encounter (HOSPITAL_COMMUNITY): Payer: Self-pay

## 2015-11-30 NOTE — Progress Notes (Signed)
[employer] disability claim medical record request faxed to CHF clinic 11/09/15. All available records from our office/providers faxed to provided # [phone]. Claim # [id number] Policy # [id number] Copy of request scanned into patient/s electronic medical record.  Ave Filter

## 2015-11-30 NOTE — Telephone Encounter (Signed)
I spoke with the pt and arranged PV consult for 12/04/15 with Dr Kirke Corin.

## 2015-12-04 ENCOUNTER — Ambulatory Visit (INDEPENDENT_AMBULATORY_CARE_PROVIDER_SITE_OTHER): Payer: Managed Care, Other (non HMO) | Admitting: Cardiovascular Disease

## 2015-12-04 ENCOUNTER — Encounter: Payer: Self-pay | Admitting: Cardiovascular Disease

## 2015-12-04 VITALS — BP 168/84 | HR 49 | Ht 64.0 in | Wt 177.2 lb

## 2015-12-04 DIAGNOSIS — I2510 Atherosclerotic heart disease of native coronary artery without angina pectoris: Secondary | ICD-10-CM

## 2015-12-04 DIAGNOSIS — I739 Peripheral vascular disease, unspecified: Secondary | ICD-10-CM

## 2015-12-04 NOTE — Patient Instructions (Signed)
Medication Instructions:  Your physician recommends that you continue on your current medications as directed. Please refer to the Current Medication list given to you today.  Labwork: No new orders.   Testing/Procedures: No new orders.   Follow-Up: You have been referred to Cardiac Rehabilitation.  They will contact you in regards to setting up orientation.   Your physician recommends that you schedule a follow-up appointment in: 3 MONTHS with Dr Kirke Corin.   Any Other Special Instructions Will Be Listed Below (If Applicable).   If you need a refill on your cardiac medications before your next appointment, please call your pharmacy.  Smoking Cessation, Tips for Success If you are ready to quit smoking, congratulations! You have chosen to help yourself be healthier. Cigarettes bring nicotine, tar, carbon monoxide, and other irritants into your body. Your lungs, heart, and blood vessels will be able to work better without these poisons. There are many different ways to quit smoking. Nicotine gum, nicotine patches, a nicotine inhaler, or nicotine nasal spray can help with physical craving. Hypnosis, support groups, and medicines help break the habit of smoking. WHAT THINGS CAN I DO TO MAKE QUITTING EASIER?  Here are some tips to help you quit for good:  Pick a date when you will quit smoking completely. Tell all of your friends and family about your plan to quit on that date.  Do not try to slowly cut down on the number of cigarettes you are smoking. Pick a quit date and quit smoking completely starting on that day.  Throw away all cigarettes.   Clean and remove all ashtrays from your home, work, and car.  On a card, write down your reasons for quitting. Carry the card with you and read it when you get the urge to smoke.  Cleanse your body of nicotine. Drink enough water and fluids to keep your urine clear or pale yellow. Do this after quitting to flush the nicotine from your  body.  Learn to predict your moods. Do not let a bad situation be your excuse to have a cigarette. Some situations in your life might tempt you into wanting a cigarette.  Never have "just one" cigarette. It leads to wanting another and another. Remind yourself of your decision to quit.  Change habits associated with smoking. If you smoked while driving or when feeling stressed, try other activities to replace smoking. Stand up when drinking your coffee. Brush your teeth after eating. Sit in a different chair when you read the paper. Avoid alcohol while trying to quit, and try to drink fewer caffeinated beverages. Alcohol and caffeine may urge you to smoke.  Avoid foods and drinks that can trigger a desire to smoke, such as sugary or spicy foods and alcohol.  Ask people who smoke not to smoke around you.  Have something planned to do right after eating or having a cup of coffee. For example, plan to take a walk or exercise.  Try a relaxation exercise to calm you down and decrease your stress. Remember, you may be tense and nervous for the first 2 weeks after you quit, but this will pass.  Find new activities to keep your hands busy. Play with a pen, coin, or rubber band. Doodle or draw things on paper.  Brush your teeth right after eating. This will help cut down on the craving for the taste of tobacco after meals. You can also try mouthwash.   Use oral substitutes in place of cigarettes. Try using lemon drops,  carrots, cinnamon sticks, or chewing gum. Keep them handy so they are available when you have the urge to smoke.  When you have the urge to smoke, try deep breathing.  Designate your home as a nonsmoking area.  If you are a heavy smoker, ask your health care provider about a prescription for nicotine chewing gum. It can ease your withdrawal from nicotine.  Reward yourself. Set aside the cigarette money you save and buy yourself something nice.  Look for support from others. Join  a support group or smoking cessation program. Ask someone at home or at work to help you with your plan to quit smoking.  Always ask yourself, "Do I need this cigarette or is this just a reflex?" Tell yourself, "Today, I choose not to smoke," or "I do not want to smoke." You are reminding yourself of your decision to quit.  Do not replace cigarette smoking with electronic cigarettes (commonly called e-cigarettes). The safety of e-cigarettes is unknown, and some may contain harmful chemicals.  If you relapse, do not give up! Plan ahead and think about what you will do the next time you get the urge to smoke. HOW WILL I FEEL WHEN I QUIT SMOKING? You may have symptoms of withdrawal because your body is used to nicotine (the addictive substance in cigarettes). You may crave cigarettes, be irritable, feel very hungry, cough often, get headaches, or have difficulty concentrating. The withdrawal symptoms are only temporary. They are strongest when you first quit but will go away within 10-14 days. When withdrawal symptoms occur, stay in control. Think about your reasons for quitting. Remind yourself that these are signs that your body is healing and getting used to being without cigarettes. Remember that withdrawal symptoms are easier to treat than the major diseases that smoking can cause.  Even after the withdrawal is over, expect periodic urges to smoke. However, these cravings are generally short lived and will go away whether you smoke or not. Do not smoke! WHAT RESOURCES ARE AVAILABLE TO HELP ME QUIT SMOKING? Your health care provider can direct you to community resources or hospitals for support, which may include:  Group support.  Education.  Hypnosis.  Therapy.   This information is not intended to replace advice given to you by your health care provider. Make sure you discuss any questions you have with your health care provider.   Document Released: 01/18/2004 Document Revised: 05/12/2014  Document Reviewed: 10/07/2012 Elsevier Interactive Patient Education [employer]

## 2015-12-04 NOTE — Progress Notes (Signed)
Cardiology Office Note   Date:  12/04/2015   ID:  Sandra Owens, DOB 01-05-1966, MRN [id number]  PCP:  Allean Found, MD  Cardiologist: Dr. Gala Romney  Chief Complaint  Patient presents with  . New Patient (Initial Visit)    tighting and cramping in legs      History of Present Illness: Sandra Owens is a 50 y.o. female who was referred by Dr. Gala Romney For evaluation and management of peripheral arterial disease. She was admitted 6/17 with NSTEMI and decompensated HF. Echo showed EF of 15-20% with diffuse hypokinesis. Had right and left heart cath on 10/1215 which showed severe biventricular CHF EF less than 20%, severe pulmonary hypertension with PA pressure 70mm Hg. severe 95% mid LAD stenosis, new likely culprit for patient's ACS. Had staged PCI of LAD on 6/14 complicated with transient cardiogenic shock during the interventional part of cardiac cath du to jailing of a septal branch  Her cardiac symptoms overall improved. However, she is limited now by  bilateral calf claudication which started in December 2016. Typically the pain happens after walking less than one block and forces her to stop and rest for few minutes before she can resume. She has no rest pain. She has not started cardiac rehabilitation. She continues to smoke but she is trying to quit. She underwent noninvasive vascular evaluation which showed moderately to severely reduced ABI bilaterally with evidence of SFA occlusion bilaterally with reconstitution via collaterals.  Past Medical History:  Diagnosis Date  . Essential hypertension   . GERD (gastroesophageal reflux disease)   . Low back pain   . Systolic CHF (HCC)    a. Found to have new low EF 11/2014 with abnormal nuc.  . Type 2 diabetes mellitus (HCC)     Past Surgical History:  Procedure Laterality Date  . CARDIAC CATHETERIZATION N/A 11/23/2014   Procedure: Right/Left Heart Cath and Coronary Angiography;  Surgeon: Corky Crafts,  MD;  Location: [facility] INVASIVE CV LAB;  Service: Cardiovascular;  Laterality: N/A;  . CARDIAC CATHETERIZATION N/A 10/15/2015   Procedure: Right/Left Heart Cath and Coronary Angiography;  Surgeon: Lyn Records, MD;  Location: [facility] INVASIVE CV LAB;  Service: Cardiovascular;  Laterality: N/A;  . CARDIAC CATHETERIZATION N/A 10/17/2015   Procedure: Coronary Stent Intervention;  Surgeon: Lyn Records, MD;  Location: [facility] INVASIVE CV LAB;  Service: Cardiovascular;  Laterality: N/A;  . TUBAL LIGATION       Current Outpatient Prescriptions  Medication Sig Dispense Refill  . albuterol (PROVENTIL HFA;VENTOLIN HFA) 108 (90 BASE) MCG/ACT inhaler Inhale 1-2 puffs into the lungs every 4 (four) hours as needed for wheezing or shortness of breath. 1 Inhaler 0  . aspirin EC 81 MG tablet Take 1 tablet (81 mg total) by mouth daily. 90 tablet 3  . atorvastatin (LIPITOR) 80 MG tablet Take 1 tablet (80 mg total) by mouth daily at 6 PM. 30 tablet 6  . carvedilol (COREG) 3.125 MG tablet Take 1 tablet (3.125 mg total) by mouth 2 (two) times daily. 180 tablet 3  . citalopram (CELEXA) 10 MG tablet Take 1 tablet (10 mg total) by mouth daily. 30 tablet 6  . digoxin (LANOXIN) 0.125 MG tablet Take 1 tablet (0.125 mg total) by mouth daily. 30 tablet 3  . famotidine (PEPCID AC) 10 MG chewable tablet Chew 10 mg by mouth daily as needed for heartburn.    . Fluticasone Furoate-Vilanterol (BREO ELLIPTA) 100-25 MCG/INH AEPB Inhale 1 puff into the lungs daily as needed (for wheezing).     Marland Kitchen  furosemide (LASIX) 20 MG tablet Take 1 tablet (20 mg total) by mouth daily. 30 tablet 3  . glucose blood test strip 1 each by Other route See admin instructions. Check blood sugar once daily    . isosorbide-hydrALAZINE (BIDIL) 20-37.5 MG tablet Take 1 tablet by mouth 3 (three) times daily. 90 tablet 3  . nicotine (NICODERM CQ - DOSED IN MG/24 HOURS) 14 mg/24hr patch Place 1 patch (14 mg total) onto the skin daily. 28 patch 3  . nitroGLYCERIN (NITROSTAT)  0.4 MG SL tablet Place 1 tablet (0.4 mg total) under the tongue every 5 (five) minutes as needed for chest pain. 30 tablet 12  . sacubitril-valsartan (ENTRESTO) 49-51 MG Take 1 tablet by mouth 2 (two) times daily. 60 tablet 5  . sitaGLIPtin (JANUVIA) 100 MG tablet Take 100 mg by mouth daily.    Marland Kitchen spironolactone (ALDACTONE) 25 MG tablet Take 0.5 tablets (12.5 mg total) by mouth daily. 30 tablet 3  . ticagrelor (BRILINTA) 90 MG TABS tablet Take 1 tablet (90 mg total) by mouth 2 (two) times daily. 60 tablet 3  . tobramycin (TOBREX) 0.3 % ophthalmic solution Place 1-2 drops into the left eye as needed (for eye infection).     No current facility-administered medications for this visit.     Allergies:   Review of patient's allergies indicates no known allergies.    Social History:  The patient  reports that she quit smoking about 7 weeks ago. Her smoking use included Cigarettes. She has a 15.00 pack-year smoking history. She does not have any smokeless tobacco history on file. She reports that she does not drink alcohol or use drugs.   Family History:  The patient's family history includes CAD in her father; Diabetes Mellitus II in her father and mother; Hypertension in her father and mother; Stroke in her paternal grandmother.    ROS:  Please see the history of present illness.   Otherwise, review of systems are positive for none.   All other systems are reviewed and negative.    PHYSICAL EXAM: VS:  BP (!) 168/84   Pulse (!) 49   Ht 5\' 4"  (1.626 m)   Wt 177 lb 3.2 oz (80.4 kg)   BMI 30.42 kg/m  , BMI Body mass index is 30.42 kg/m. GEN: Well nourished, well developed, in no acute distress  HEENT: normal  Neck: no JVD, carotid bruits, or masses Cardiac: RRR; no murmurs, rubs, or gallops,no edema  Respiratory:  clear to auscultation bilaterally, normal work of breathing GI: soft, nontender, nondistended, + BS MS: no deformity or atrophy  Skin: warm and dry, no rash Neuro:  Strength and  sensation are intact Psych: euthymic mood, full affect Vascular: Distal pulses are not palpable.   EKG:  EKG is not ordered today.    Recent Labs: 10/12/2015: ALT 13 10/19/2015: Magnesium 2.1 10/31/2015: B Natriuretic Peptide 199.1; Hemoglobin 13.0; Platelets 340 11/23/2015: BUN 10; Creatinine, Ser 1.17; Potassium 3.9; Sodium 139    Lipid Panel    Component Value Date/Time   CHOL 199 10/14/2015 0417   TRIG 122 10/14/2015 0417   HDL 38 (L) 10/14/2015 0417   CHOLHDL 5.2 10/14/2015 0417   VLDL 24 10/14/2015 0417   LDLCALC 137 (H) 10/14/2015 0417      Wt Readings from Last 3 Encounters:  12/04/15 177 lb 3.2 oz (80.4 kg)  11/14/15 184 lb 6.4 oz (83.6 kg)  10/31/15 187 lb (84.8 kg)        ASSESSMENT AND  PLAN:  1.  Peripheral arterial disease with severe bilateral leg claudication due to bilateral SFA occlusion: I had a prolonged discussion with her about the natural history and management of claudication. Long SFA occlusion is not favorable for endovascular intervention and thus I favor a walking program initially. She needs to attend cardiac rehabilitation anyway and I referred her today. I discussed with her the walking program in details. I also discussed with her the importance of healthy lifestyle changes and especially smoking cessation. Given her cardiomyopathy,Cilostazol is contraindicated. Angiography and endovascular intervention can be considered if there is no improvement with medical therapy and a walking program. I will reevaluate her symptoms in 3 months.  2. Coronary artery disease involving native coronary arteries without angina: She had myocardial infarction and stent placement in June. She was referred to cardiac rehabilitation.  3. Chronic systolic heart failure: She appears to be euvolemic and currently on optimal medical therapy.  4. Tobacco use: Smoking cessation was discussed.  5. Hyperlipidemia: Continue treatment with atorvastatin with a target LDL of  less than 70.    Disposition:   FU with me in 3 months  Signed,  Lorine Bears, MD  12/04/2015 9:11 AM    [facility]

## 2015-12-06 ENCOUNTER — Encounter (HOSPITAL_COMMUNITY): Payer: Self-pay | Admitting: Internal Medicine

## 2015-12-06 ENCOUNTER — Ambulatory Visit (HOSPITAL_COMMUNITY)
Admission: RE | Admit: 2015-12-06 | Discharge: 2015-12-06 | Disposition: A | Discharge status: Home / Self Care | Payer: Managed Care, Other (non HMO) | Source: Ambulatory Visit | Attending: Internal Medicine | Admitting: Internal Medicine

## 2015-12-06 VITALS — BP 126/70 | HR 84 | Ht 65.0 in | Wt 180.0 lb

## 2015-12-06 DIAGNOSIS — Z8249 Family history of ischemic heart disease and other diseases of the circulatory system: Secondary | ICD-10-CM | POA: Insufficient documentation

## 2015-12-06 DIAGNOSIS — I252 Old myocardial infarction: Secondary | ICD-10-CM | POA: Insufficient documentation

## 2015-12-06 DIAGNOSIS — Z87891 Personal history of nicotine dependence: Secondary | ICD-10-CM | POA: Insufficient documentation

## 2015-12-06 DIAGNOSIS — Z833 Family history of diabetes mellitus: Secondary | ICD-10-CM | POA: Diagnosis not present

## 2015-12-06 DIAGNOSIS — G4733 Obstructive sleep apnea (adult) (pediatric): Secondary | ICD-10-CM | POA: Insufficient documentation

## 2015-12-06 DIAGNOSIS — I5022 Chronic systolic (congestive) heart failure: Secondary | ICD-10-CM | POA: Diagnosis present

## 2015-12-06 DIAGNOSIS — I25110 Atherosclerotic heart disease of native coronary artery with unstable angina pectoris: Secondary | ICD-10-CM

## 2015-12-06 DIAGNOSIS — E669 Obesity, unspecified: Secondary | ICD-10-CM | POA: Diagnosis not present

## 2015-12-06 DIAGNOSIS — I10 Essential (primary) hypertension: Secondary | ICD-10-CM | POA: Diagnosis not present

## 2015-12-06 DIAGNOSIS — K219 Gastro-esophageal reflux disease without esophagitis: Secondary | ICD-10-CM | POA: Insufficient documentation

## 2015-12-06 DIAGNOSIS — F329 Major depressive disorder, single episode, unspecified: Secondary | ICD-10-CM | POA: Diagnosis not present

## 2015-12-06 DIAGNOSIS — I110 Hypertensive heart disease with heart failure: Secondary | ICD-10-CM | POA: Insufficient documentation

## 2015-12-06 DIAGNOSIS — I272 Other secondary pulmonary hypertension: Secondary | ICD-10-CM | POA: Diagnosis not present

## 2015-12-06 DIAGNOSIS — I429 Cardiomyopathy, unspecified: Secondary | ICD-10-CM | POA: Diagnosis not present

## 2015-12-06 DIAGNOSIS — Z823 Family history of stroke: Secondary | ICD-10-CM | POA: Insufficient documentation

## 2015-12-06 DIAGNOSIS — Z955 Presence of coronary angioplasty implant and graft: Secondary | ICD-10-CM | POA: Diagnosis not present

## 2015-12-06 DIAGNOSIS — E119 Type 2 diabetes mellitus without complications: Secondary | ICD-10-CM | POA: Insufficient documentation

## 2015-12-06 DIAGNOSIS — Z7982 Long term (current) use of aspirin: Secondary | ICD-10-CM | POA: Insufficient documentation

## 2015-12-06 MED ORDER — SACUBITRIL-VALSARTAN 97-103 MG PO TABS: 1.0000 | ORAL_TABLET | Freq: Two times a day (BID) | ORAL | 3 refills | Status: DC

## 2015-12-06 NOTE — Progress Notes (Signed)
Patient ID: Sandra Owens, female   DOB: October 25, 1965, 50 y.o.   MRN: [id number] .  Patient ID: Sandra Owens, female   DOB: 05-15-1965, 50 y.o.   MRN: [id number] . PCP: Primary Cardiologist:  HPI:  50 y/o women with obesity, HTN, DM2, chronic systolic HF due to NICM (diagnosed 7/16).   Admitted 6/17 with NSTEMI and decompensated HF. Echo showed EF of 15-20% with diffuse hypokinesis. Had right and left heart cath on 10/1215 which showed severe biventricular CHF EF less than 20%, severe pulmonary hypertension with PA pressure 70mm Hg. severe 95% mid LAD stenosis, new likely culprit for patient's ACS. Had staged PCI of LAD on 6/14 complicated with transient cardiogenic shock during the interventional part of cardiac cath du to jailing of a septal branch   She presents today for regular follow up. At last visit started Coreg and added Celexa. Entresto increased after pharmacy visit. Also had ABIs which showed moderately to severely reduced ABI bilaterally with evidence of SFA occlusion bilaterally with reconstitution via collaterals. Referred to Dr. Kirke Corin who recommended trial of exercise and smoking cessation. Stays busy. Doing all ADLs and all housework without any problem. Denies any dyspnea or CP. Limited by legs. Distance she can walk varies from 50 feet to the whole mall before pain. Weight down 7 pounds. Using patch and gum. Only smoking 1-2 cigs  Cath 6/17  LAD 90% LCX 70% RCA 40%   RA 20 RV 67/22 PA 76/33 (53) PCWP 44 CO/CI = 3.3/1.7   ROS: All systems negative except as listed in HPI, PMH and Problem List.  SH:  Social History   Social History  . Marital status: Single    Spouse name: N/A  . Number of children: 3  . Years of education: N/A   Occupational History  . Not on file.   Social History Main Topics  . Smoking status: Former Smoker    Packs/day: 0.50    Years: 30.00    Types: Cigarettes    Quit date: 10/12/2015  . Smokeless tobacco: Not on file  .  Alcohol use No  . Drug use: No  . Sexual activity: Not on file   Other Topics Concern  . 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         FH:  Family History  Problem Relation Age of Onset  . Hypertension Father   . Diabetes Mellitus II Father   . Hypertension Mother   . Diabetes Mellitus II Mother   . CAD Father     CABG in his 34s  . Stroke Paternal Grandmother     Past Medical History:  Diagnosis Date  . Essential hypertension   . GERD (gastroesophageal reflux disease)   . Low back pain   . Systolic CHF (HCC)    a. Found to have new low EF 52/2016 with abnormal nuc.  . Type 2 diabetes mellitus (HCC)     Current Outpatient Prescriptions  Medication Sig Dispense Refill  . aspirin EC 81 MG tablet Take 1 tablet (81 mg total) by mouth daily. 90 tablet 3  . atorvastatin (LIPITOR) 80 MG tablet Take 1 tablet (80 mg total) by mouth daily at 6 PM. 30 tablet 6  . carvedilol (COREG) 3.125 MG tablet Take 1 tablet (3.125 mg total) by mouth 2 (two) times daily. 180 tablet 3  . citalopram (CELEXA) 10 MG tablet Take 1 tablet (10 mg total) by mouth  daily. 30 tablet 6  . digoxin (LANOXIN) 0.125 MG tablet Take 1 tablet (0.125 mg total) by mouth daily. 30 tablet 3  . famotidine (PEPCID AC) 10 MG chewable tablet Chew 10 mg by mouth daily as needed for heartburn.    . Fluticasone Furoate-Vilanterol (BREO ELLIPTA) 100-25 MCG/INH AEPB Inhale 1 puff into the lungs daily as needed (for wheezing).     . furosemide (LASIX) 20 MG tablet Take 1 tablet (20 mg total) by mouth daily. 30 tablet 3  . glucose blood test strip 1 each by Other route See admin instructions. Check blood sugar once daily    . isosorbide-hydrALAZINE (BIDIL) 20-37.5 MG tablet Take 1 tablet by mouth 3 (three) times daily. 90 tablet 3  . nicotine (NICODERM CQ - DOSED IN MG/24 HOURS) 14 mg/24hr patch Place 1 patch (14 mg total) onto the skin daily. 28 patch 3  . nitroGLYCERIN  (NITROSTAT) 0.4 MG SL tablet Place 1 tablet (0.4 mg total) under the tongue every 5 (five) minutes as needed for chest pain. 30 tablet 12  . sacubitril-valsartan (ENTRESTO) 49-51 MG Take 1 tablet by mouth 2 (two) times daily. 60 tablet 5  . sitaGLIPtin (JANUVIA) 100 MG tablet Take 100 mg by mouth daily.    Marland Kitchen spironolactone (ALDACTONE) 25 MG tablet Take 0.5 tablets (12.5 mg total) by mouth daily. 30 tablet 3  . ticagrelor (BRILINTA) 90 MG TABS tablet Take 1 tablet (90 mg total) by mouth 2 (two) times daily. 60 tablet 3  . tobramycin (TOBREX) 0.3 % ophthalmic solution Place 1-2 drops into the left eye as needed (for eye infection).    Marland Kitchen albuterol (PROVENTIL HFA;VENTOLIN HFA) 108 (90 BASE) MCG/ACT inhaler Inhale 1-2 puffs into the lungs every 4 (four) hours as needed for wheezing or shortness of breath. (Patient not taking: Reported on 12/06/2015) 1 Inhaler 0   No current facility-administered medications for this encounter.     Vitals:   12/06/15 1408  BP: 126/70  Pulse: 84  Weight: 180 lb (81.6 kg)  Height: 5\' 5"  (1.651 m)   Wt Readings from Last 3 Encounters:  12/06/15 180 lb (81.6 kg)  12/04/15 177 lb 3.2 oz (80.4 kg)  11/14/15 184 lb 6.4 oz (83.6 kg)     PHYSICAL EXAM:  General:  Well appearing. No resp difficulty HEENT: normal Neck: supple. JVP flat. Carotids 2+ bilaterally; no bruits. No lymphadenopathy or thryomegaly appreciated. Cor: PMI normal. Tachycardia regular. No rubs, gallops or murmurs. Lungs: clear Abdomen: obese soft, nontender, nondistended. No hepatosplenomegaly. No bruits or masses. Good bowel sounds. Extremities: no cyanosis, clubbing, rash, edema Neuro: alert & orientedx3, cranial nerves grossly intact. Moves all 4 extremities w/o difficulty. Affect pleasant.   ASSESSMENT & PLAN: 1. Chronic systolic heart failure  - due mostly to NICM. EF 15-20% with biventricular dysfunction.  2. CAD s/p NSTEMI 6/17   -- s/p DES to LAD 3. Tobacco abuse 4. OSA 5. DM2 6.  Claudication  7. Depression  Doing ok. NYHA II-III. Volume status ok. HR remains fast. Also struggling with claduication and depression/anxiety.   Increase Entresto to 97/103 Refer to CR. Continue smoking cessation efforts. Sleep study in October Will revisit return to work at next visit. If feeling well and doing ok with CR can return to work as long as no heavy lifting and can take rest breaks as needed.   Sandra Dollar Tillery,MD 2:12 PM  Patient seen and examined with Otilio Saber, PA-C. We discussed all aspects of the encounter. I agree  with the assessment and plan as stated above.  She is mildly improved but still tachycardic and also struggling with depression and claudication. Increase Entresto. Check ABIs. Refer to CR.  Sandra Terwilliger,MD 11:45 PM

## 2015-12-06 NOTE — Patient Instructions (Signed)
Your provider has referred you to Cardiac Rehab.  Increase Entresto to 97/103 twice daily.  Lab work in 2 weeks.   Echo and Follow up with Dr. Gala Romney in 4 weeks

## 2015-12-21 ENCOUNTER — Ambulatory Visit (HOSPITAL_COMMUNITY)
Admission: RE | Admit: 2015-12-21 | Discharge: 2015-12-21 | Disposition: A | Discharge status: Home / Self Care | Payer: Managed Care, Other (non HMO) | Source: Ambulatory Visit | Attending: Internal Medicine | Admitting: Internal Medicine

## 2015-12-21 DIAGNOSIS — I5022 Chronic systolic (congestive) heart failure: Secondary | ICD-10-CM | POA: Diagnosis not present

## 2015-12-21 LAB — BASIC METABOLIC PANEL
Anion gap: 8 (ref 5–15)
BUN: 16 mg/dL (ref 6–20)
CHLORIDE: 108 mmol/L (ref 101–111)
CO2: 23 mmol/L (ref 22–32)
Calcium: 9.5 mg/dL (ref 8.9–10.3)
Creatinine, Ser: 1.35 mg/dL — ABNORMAL HIGH (ref 0.44–1.00)
GFR calc Af Amer: 52 mL/min — ABNORMAL LOW (ref >60)
GFR calc non Af Amer: 45 mL/min — ABNORMAL LOW (ref >60)
GLUCOSE: 144 mg/dL — AB (ref 65–99)
POTASSIUM: 3.9 mmol/L (ref 3.5–5.1)
Sodium: 139 mmol/L (ref 135–145)

## 2015-12-30 NOTE — Addendum Note (Signed)
Encounter addended by: Dolores Patty, MD on: 12/30/2015 11:46 PM<BR>    Actions taken: LOS modified, Sign clinical note

## 2016-01-01 ENCOUNTER — Encounter (HOSPITAL_COMMUNITY)
Admission: RE | Admit: 2016-01-01 | Discharge: 2016-01-01 | Disposition: A | Discharge status: Home / Self Care | Payer: Managed Care, Other (non HMO) | Source: Ambulatory Visit | Attending: Internal Medicine | Admitting: Internal Medicine

## 2016-01-01 NOTE — Progress Notes (Signed)
Cardiac Rehab Medication Review by a Pharmacist  Does the patient  feel that his/her medications are working for him/her?  yes  Has the patient been experiencing any side effects to the medications prescribed?  no  Does the patient measure his/her own blood pressure or blood glucose at home?  yes   Does the patient have any problems obtaining medications due to transportation or finances?   no  Understanding of regimen: good Understanding of indications: good Potential of compliance: good    Pharmacist comments: Sandra Owens is a 2 yoF who presents to cardiac rehab today in good spirits with no complaints. She stated that the only issue is her appetite and that she prefers to have multiple snacks throughout the day versus large meals. We discussed healthy eating habits and at least a small meal each morning with medications. Overall, she voiced understanding of her medication regimen and agreed with the plan.  Sandra Owens, PharmD PGY1 Pharmacy Resident [phone] (Pager) 01/01/2016 2:24 PM

## 2016-01-09 ENCOUNTER — Encounter (HOSPITAL_COMMUNITY)
Admission: RE | Admit: 2016-01-09 | Discharge: 2016-01-09 | Disposition: A | Discharge status: Home / Self Care | Payer: Managed Care, Other (non HMO) | Source: Ambulatory Visit | Attending: Internal Medicine | Admitting: Internal Medicine

## 2016-01-09 VITALS — BP 110/70 | HR 81 | Ht 64.0 in | Wt 173.1 lb

## 2016-01-09 DIAGNOSIS — I5022 Chronic systolic (congestive) heart failure: Secondary | ICD-10-CM | POA: Diagnosis present

## 2016-01-09 DIAGNOSIS — Z955 Presence of coronary angioplasty implant and graft: Secondary | ICD-10-CM

## 2016-01-09 DIAGNOSIS — Z5189 Encounter for other specified aftercare: Secondary | ICD-10-CM | POA: Insufficient documentation

## 2016-01-09 DIAGNOSIS — I214 Non-ST elevation (NSTEMI) myocardial infarction: Secondary | ICD-10-CM

## 2016-01-09 HISTORY — DX: Atherosclerotic heart disease of native coronary artery without angina pectoris: I25.10

## 2016-01-09 HISTORY — DX: Hyperlipidemia, unspecified: E78.5

## 2016-01-09 LAB — GLUCOSE, CAPILLARY: GLUCOSE-CAPILLARY: 111 mg/dL — AB (ref 65–99)

## 2016-01-09 NOTE — Progress Notes (Signed)
Cardiac Individual Treatment Plan  Patient Details  Name: Sandra Owens MRN: [id number] Date of Birth: 08-06-1965 Referring Provider:   Flowsheet Row CARDIAC REHAB PHASE II EXERCISE from 01/09/2016 in [facility]  Referring Provider  Arvilla Meres MD      Initial Encounter Date:  Flowsheet Row CARDIAC REHAB PHASE II EXERCISE from 01/09/2016 in [facility] CARDIAC REHAB  Date  01/09/16  Referring Provider  Arvilla Meres MD      Visit Diagnosis: NSTEMI (non-ST elevated myocardial infarction) [facility])  Stented coronary artery  Patient's Home Medications on Admission:  Current Outpatient Prescriptions:  .  aspirin EC 81 MG tablet, Take 1 tablet (81 mg total) by mouth daily., Disp: 90 tablet, Rfl: 3 .  atorvastatin (LIPITOR) 80 MG tablet, Take 1 tablet (80 mg total) by mouth daily at 6 PM., Disp: 30 tablet, Rfl: 6 .  carvedilol (COREG) 3.125 MG tablet, Take 1 tablet (3.125 mg total) by mouth 2 (two) times daily., Disp: 180 tablet, Rfl: 3 .  citalopram (CELEXA) 10 MG tablet, Take 1 tablet (10 mg total) by mouth daily., Disp: 30 tablet, Rfl: 6 .  furosemide (LASIX) 20 MG tablet, Take 1 tablet (20 mg total) by mouth daily., Disp: 30 tablet, Rfl: 3 .  glucose blood test strip, 1 each by Other route See admin instructions. Check blood sugar once daily, Disp: , Rfl:  .  isosorbide-hydrALAZINE (BIDIL) 20-37.5 MG tablet, Take 1 tablet by mouth 3 (three) times daily., Disp: 90 tablet, Rfl: 3 .  nicotine (NICODERM CQ - DOSED IN MG/24 HOURS) 14 mg/24hr patch, Place 1 patch (14 mg total) onto the skin daily., Disp: 28 patch, Rfl: 3 .  nitroGLYCERIN (NITROSTAT) 0.4 MG SL tablet, Place 1 tablet (0.4 mg total) under the tongue every 5 (five) minutes as needed for chest pain., Disp: 30 tablet, Rfl: 12 .  sacubitril-valsartan (ENTRESTO) 97-103 MG, Take 1 tablet by mouth 2 (two) times daily., Disp: 60 tablet, Rfl: 3 .  sitaGLIPtin (JANUVIA) 100 MG  tablet, Take 100 mg by mouth daily., Disp: , Rfl:  .  spironolactone (ALDACTONE) 25 MG tablet, Take 0.5 tablets (12.5 mg total) by mouth daily., Disp: 30 tablet, Rfl: 3 .  ticagrelor (BRILINTA) 90 MG TABS tablet, Take 1 tablet (90 mg total) by mouth 2 (two) times daily., Disp: 60 tablet, Rfl: 3 .  tobramycin (TOBREX) 0.3 % ophthalmic solution, Place 1-2 drops into the left eye as needed (for eye infection)., Disp: , Rfl:  .  famotidine (PEPCID AC) 10 MG chewable tablet, Chew 10 mg by mouth daily as needed for heartburn., Disp: , Rfl:   Past Medical History: Past Medical History:  Diagnosis Date  . Coronary artery disease   . Essential hypertension   . GERD (gastroesophageal reflux disease)   . Hyperlipidemia   . Low back pain   . Systolic CHF (HCC)    a. Found to have new low EF 11/2014 with abnormal nuc.  . Type 2 diabetes mellitus (HCC)     Tobacco Use: History  Smoking Status  . Former Smoker  . Packs/day: 0.50  . Years: 30.00  . Types: Cigarettes  . Quit date: 10/12/2015  Smokeless Tobacco  . Never Used    Labs: Recent Review Flowsheet Data    Labs for ITP Cardiac and Pulmonary Rehab Latest Ref Rng & Units 10/15/2015 10/16/2015 10/17/2015 10/18/2015 10/19/2015   Cholestrol 0 - 200 mg/dL - - - - -   LDLCALC 0 -  99 mg/dL - - - - -   HDL >34 mg/dL - - - - -   Trlycerides <150 mg/dL - - - - -   Hemoglobin A1c 4.8 - 5.6 % - - - - -   PHART 7.350 - 7.450 - - - - -   PCO2ART 35.0 - 45.0 mmHg - - - - -   HCO3 20.0 - 24.0 mEq/L 24.0 - - - -   TCO2 0 - 100 mmol/L 26 - - - -   ACIDBASEDEF 0.0 - 2.0 mmol/L 3.0(H) - - - -   O2SAT % 36.0 76.3 64.2 67.7 69.5      Capillary Blood Glucose: Lab Results  Component Value Date   GLUCAP 111 (H) 01/09/2016   GLUCAP 173 (H) 10/19/2015   GLUCAP 137 (H) 10/19/2015   GLUCAP 163 (H) 10/18/2015   GLUCAP 119 (H) 10/18/2015     Exercise Target Goals: Date: 01/09/16  Exercise Program Goal: Individual exercise prescription set with THRR,  safety & activity barriers. Participant demonstrates ability to understand and report RPE using BORG scale, to self-measure pulse accurately, and to acknowledge the importance of the exercise prescription.  Exercise Prescription Goal: Starting with aerobic activity 30 plus minutes a day, 3 days per week for initial exercise prescription. Provide home exercise prescription and guidelines that participant acknowledges understanding prior to discharge.  Activity Barriers & Risk Stratification:     Activity Barriers & Cardiac Risk Stratification - 01/09/16 1740      Activity Barriers & Cardiac Risk Stratification   Activity Barriers Other (comment)   Comments PAD, bilateral claudication   Cardiac Risk Stratification High      6 Minute Walk:     6 Minute Walk    Row Name 01/09/16 1750         6 Minute Walk   Phase Initial     Distance 1113 feet     Walk Time 6 minutes     # of Rest Breaks 0     MPH 2.11     METS 3.62     RPE 13     VO2 Peak 12.67     Symptoms Yes (comment)     Comments bilateral leg pain     Resting HR 81 bpm     Resting BP 110/70     Max Ex. HR 111 bpm     Max Ex. BP 140/78     2 Minute Post BP 130/72        Initial Exercise Prescription:     Initial Exercise Prescription - 01/09/16 1700      Date of Initial Exercise RX and Referring Provider   Date 01/09/16   Referring Provider Arvilla Meres MD     Bike   Level 1.1   Minutes 10   METs 3.68     NuStep   Level 3   Minutes 10   METs 2.5     Track   Laps 9   Minutes 10   METs 2.57     Prescription Details   Frequency (times per week) 3   Duration Progress to 30 minutes of continuous aerobic without signs/symptoms of physical distress     Intensity   THRR 40-80% of Max Heartrate 68-135   Ratings of Perceived Exertion 11-13   Perceived Dyspnea 0-4     Progression   Progression Continue to progress workloads to maintain intensity without signs/symptoms of physical distress.      Resistance Training  Training Prescription Yes   Weight 1   Reps 10-12      Perform Capillary Blood Glucose checks as needed.  Exercise Prescription Changes:   Exercise Comments:   Discharge Exercise Prescription (Final Exercise Prescription Changes):   Nutrition:  Target Goals: Understanding of nutrition guidelines, daily intake of sodium 1500mg , cholesterol 200mg , calories 30% from fat and 7% or less from saturated fats, daily to have 5 or more servings of fruits and vegetables.  Biometrics:     Pre Biometrics - 01/09/16 1749      Pre Biometrics   Height 5\' 4"  (1.626 m)   Weight 173 lb 1 oz (78.5 kg)   Waist Circumference 39.5 inches   Hip Circumference 44.75 inches   Waist to Hip Ratio 0.88 %   BMI (Calculated) 29.8   Triceps Skinfold 49 mm   % Body Fat 43.5 %   Grip Strength 38.5 kg   Flexibility 11 in   Single Leg Stand 41.69 seconds       Nutrition Therapy Plan and Nutrition Goals:   Nutrition Discharge: Nutrition Scores:   Nutrition Goals Re-Evaluation:   Psychosocial: Target Goals: Acknowledge presence or absence of depression, maximize coping skills, provide positive support system. Participant is able to verbalize types and ability to use techniques and skills needed for reducing stress and depression.  Initial Review & Psychosocial Screening:     Initial Psych Review & Screening - 01/10/16 1530      Family Dynamics   Good Support System? Yes     Barriers   Psychosocial barriers to participate in program There are no identifiable barriers or psychosocial needs.     Screening Interventions   Interventions Encouraged to exercise      Quality of Life Scores:     Quality of Life - 01/09/16 1748      Quality of Life Scores   Health/Function Pre 23.03 %   Socioeconomic Pre 19.13 %   Psych/Spiritual Pre 25.71 %   Family Pre 28.8 %   GLOBAL Pre 23.5 %      PHQ-9: Recent Review Flowsheet Data    Depression screen PHQ 2/9  01/09/2016   Decreased Interest 0   Down, Depressed, Hopeless 0   PHQ - 2 Score 0      Psychosocial Evaluation and Intervention:   Psychosocial Re-Evaluation:   Vocational Rehabilitation: Provide vocational rehab assistance to qualifying candidates.   Vocational Rehab Evaluation & Intervention:     Vocational Rehab - 01/10/16 1529      Initial Vocational Rehab Evaluation & Intervention   Assessment shows need for Vocational Rehabilitation No      Education: Education Goals: Education classes will be provided on a weekly basis, covering required topics. Participant will state understanding/return demonstration of topics presented.  Learning Barriers/Preferences:     Learning Barriers/Preferences - 01/09/16 1740      Learning Barriers/Preferences   Learning Barriers None   Learning Preferences Video;Pictoral      Education Topics: Count Your Pulse:  -Group instruction provided by verbal instruction, demonstration, patient participation and written materials to support subject.  Instructors address importance of being able to find your pulse and how to count your pulse when at home without a heart monitor.  Patients get hands on experience counting their pulse with staff help and individually.   Heart Attack, Angina, and Risk Factor Modification:  -Group instruction provided by verbal instruction, video, and written materials to support subject.  Instructors address signs and symptoms of angina  and heart attacks.    Also discuss risk factors for heart disease and how to make changes to improve heart health risk factors.   Functional Fitness:  -Group instruction provided by verbal instruction, demonstration, patient participation, and written materials to support subject.  Instructors address safety measures for doing things around the house.  Discuss how to get up and down off the floor, how to pick things up properly, how to safely get out of a chair without assistance,  and balance training.   Meditation and Mindfulness:  -Group instruction provided by verbal instruction, patient participation, and written materials to support subject.  Instructor addresses importance of mindfulness and meditation practice to help reduce stress and improve awareness.  Instructor also leads participants through a meditation exercise.    Stretching for Flexibility and Mobility:  -Group instruction provided by verbal instruction, patient participation, and written materials to support subject.  Instructors lead participants through series of stretches that are designed to increase flexibility thus improving mobility.  These stretches are additional exercise for major muscle groups that are typically performed during regular warm up and cool down.   Hands Only CPR Anytime:  -Group instruction provided by verbal instruction, video, patient participation and written materials to support subject.  Instructors co-teach with AHA video for hands only CPR.  Participants get hands on experience with mannequins.   Nutrition I class: Heart Healthy Eating:  -Group instruction provided by PowerPoint slides, verbal discussion, and written materials to support subject matter. The instructor gives an explanation and review of the Therapeutic Lifestyle Changes diet recommendations, which includes a discussion on lipid goals, dietary fat, sodium, fiber, plant stanol/sterol esters, sugar, and the components of a well-balanced, healthy diet.   Nutrition II class: Lifestyle Skills:  -Group instruction provided by PowerPoint slides, verbal discussion, and written materials to support subject matter. The instructor gives an explanation and review of label reading, grocery shopping for heart health, heart healthy recipe modifications, and ways to make healthier choices when eating out.   Diabetes Question & Answer:  -Group instruction provided by PowerPoint slides, verbal discussion, and written  materials to support subject matter. The instructor gives an explanation and review of diabetes co-morbidities, pre- and post-prandial blood glucose goals, pre-exercise blood glucose goals, signs, symptoms, and treatment of hypoglycemia and hyperglycemia, and foot care basics.   Diabetes Blitz:  -Group instruction provided by PowerPoint slides, verbal discussion, and written materials to support subject matter. The instructor gives an explanation and review of the physiology behind type 1 and type 2 diabetes, diabetes medications and rational behind using different medications, pre- and post-prandial blood glucose recommendations and Hemoglobin A1c goals, diabetes diet, and exercise including blood glucose guidelines for exercising safely.    Portion Distortion:  -Group instruction provided by PowerPoint slides, verbal discussion, written materials, and food models to support subject matter. The instructor gives an explanation of serving size versus portion size, changes in portions sizes over the last 20 years, and what consists of a serving from each food group.   Stress Management:  -Group instruction provided by verbal instruction, video, and written materials to support subject matter.  Instructors review role of stress in heart disease and how to cope with stress positively.     Exercising on Your Own:  -Group instruction provided by verbal instruction, power point, and written materials to support subject.  Instructors discuss benefits of exercise, components of exercise, frequency and intensity of exercise, and end points for exercise.  Also discuss use of  nitroglycerin and activating EMS.  Review options of places to exercise outside of rehab.  Review guidelines for sex with heart disease.   Cardiac Drugs I:  -Group instruction provided by verbal instruction and written materials to support subject.  Instructor reviews cardiac drug classes: antiplatelets, anticoagulants, beta blockers,  and statins.  Instructor discusses reasons, side effects, and lifestyle considerations for each drug class.   Cardiac Drugs II:  -Group instruction provided by verbal instruction and written materials to support subject.  Instructor reviews cardiac drug classes: angiotensin converting enzyme inhibitors (ACE-I), angiotensin II receptor blockers (ARBs), nitrates, and calcium channel blockers.  Instructor discusses reasons, side effects, and lifestyle considerations for each drug class.   Anatomy and Physiology of the Circulatory System:  -Group instruction provided by verbal instruction, video, and written materials to support subject.  Reviews functional anatomy of heart, how it relates to various diagnoses, and what role the heart plays in the overall system. Flowsheet Row CARDIAC REHAB PHASE II EXERCISE from 01/09/2016 in [facility] CARDIAC REHAB  Date  01/09/16  Instruction Review Code  2- meets goals/outcomes      Knowledge Questionnaire Score:     Knowledge Questionnaire Score - 01/09/16 1747      Knowledge Questionnaire Score   Pre Score 25/28      Core Components/Risk Factors/Patient Goals at Admission:     Personal Goals and Risk Factors at Admission - 01/09/16 1801      Core Components/Risk Factors/Patient Goals on Admission    Weight Management Obesity;Yes   Intervention Weight Management: Develop a combined nutrition and exercise program designed to reach desired caloric intake, while maintaining appropriate intake of nutrient and fiber, sodium and fats, and appropriate energy expenditure required for the weight goal.;Weight Management: Provide education and appropriate resources to help participant work on and attain dietary goals.;Weight Management/Obesity: Establish reasonable short term and long term weight goals.;Obesity: Provide education and appropriate resources to help participant work on and attain dietary goals.   Admit Weight 173 lb 1 oz (78.5  kg)   Goal Weight: Short Term 167 lb 11.2 oz (76.1 kg)   Goal Weight: Long Term 162 lb 11.2 oz (73.8 kg)   Expected Outcomes Short Term: Continue to assess and modify interventions until short term weight is achieved;Long Term: Adherence to nutrition and physical activity/exercise program aimed toward attainment of established weight goal;Weight Loss: Understanding of general recommendations for a balanced deficit meal plan, which promotes 1-2 lb weight loss per week and includes a negative energy balance of [phone] kcal/d   Sedentary Yes   Intervention Provide advice, education, support and counseling about physical activity/exercise needs.;Develop an individualized exercise prescription for aerobic and resistive training based on initial evaluation findings, risk stratification, comorbidities and participant's personal goals.   Expected Outcomes Achievement of increased cardiorespiratory fitness and enhanced flexibility, muscular endurance and strength shown through measurements of functional capacity and personal statement of participant.   Increase Strength and Stamina Yes   Intervention Provide advice, education, support and counseling about physical activity/exercise needs.;Develop an individualized exercise prescription for aerobic and resistive training based on initial evaluation findings, risk stratification, comorbidities and participant's personal goals.   Expected Outcomes Achievement of increased cardiorespiratory fitness and enhanced flexibility, muscular endurance and strength shown through measurements of functional capacity and personal statement of participant.   Tobacco Cessation Yes   Intervention Assist the participant in steps to quit. Provide individualized education and counseling about committing to Tobacco Cessation, relapse prevention, and pharmacological support  that can be provided by physician.;Education officer, environmentalffer self-teaching materials, assist with locating and accessing local/national  Quit Smoking programs, and support quit date choice.   Expected Outcomes Short Term: Will demonstrate readiness to quit, by selecting a quit date.;Long Term: Complete abstinence from all tobacco products for at least 12 months from quit date.;Short Term: Will quit all tobacco product use, adhering to prevention of relapse plan.   Diabetes Yes   Intervention Provide education about signs/symptoms and action to take for hypo/hyperglycemia.;Provide education about proper nutrition, including hydration, and aerobic/resistive exercise prescription along with prescribed medications to achieve blood glucose in normal ranges: Fasting glucose 65-99 mg/dL   Expected Outcomes Short Term: Participant verbalizes understanding of the signs/symptoms and immediate care of hyper/hypoglycemia, proper foot care and importance of medication, aerobic/resistive exercise and nutrition plan for blood glucose control.;Long Term: Attainment of HbA1C < 7%.   Hypertension Yes   Intervention Provide education on lifestyle modifcations including regular physical activity/exercise, weight management, moderate sodium restriction and increased consumption of fresh fruit, vegetables, and low fat dairy, alcohol moderation, and smoking cessation.;Monitor prescription use compliance.   Expected Outcomes Short Term: Continued assessment and intervention until BP is < 140/2590mm HG in hypertensive participants. < 130/2680mm HG in hypertensive participants with diabetes, heart failure or chronic kidney disease.;Long Term: Maintenance of blood pressure at goal levels.   Lipids Yes   Intervention Provide education and support for participant on nutrition & aerobic/resistive exercise along with prescribed medications to achieve LDL 70mg , HDL >40mg .   Expected Outcomes Short Term: Participant states understanding of desired cholesterol values and is compliant with medications prescribed. Participant is following exercise prescription and nutrition  guidelines.;Long Term: Cholesterol controlled with medications as prescribed, with individualized exercise RX and with personalized nutrition plan. Value goals: LDL < 70mg , HDL > 40 mg.   Stress Yes   Intervention Offer individual and/or small group education and counseling on adjustment to heart disease, stress management and health-related lifestyle change. Teach and support self-help strategies.   Expected Outcomes Short Term: Participant demonstrates changes in health-related behavior, relaxation and other stress management skills, ability to obtain effective social support, and compliance with psychotropic medications if prescribed.;Long Term: Emotional wellbeing is indicated by absence of clinically significant psychosocial distress or social isolation.   Personal Goal Other Yes   Personal Goal Decreased claudication symptoms. Improve heart healthy lifestyle.   Intervention Provide educaiton regarding walking program and parameters to assist with building collateral circulaiton to help decrease claudication symptoms. Provide education regarding risk factor modification and heart healthy lifestyle.    Expected Outcomes Patient will be more knowledgeable about heart healthy lifestyle and adopt better habits to improve heart health.      Core Components/Risk Factors/Patient Goals Review:    Core Components/Risk Factors/Patient Goals at Discharge (Final Review):    ITP Comments:     ITP Comments    Row Name 01/09/16 1801           ITP Comments Medical Director- Dr. Armanda Magicraci Turner MD          Comments: Patient attended orientation from 1430 to 1500 to review rules and guidelines for program. Completed 6 minute walk test, Intitial ITP, and exercise prescription.  VSS. Telemetry-Sinus rhythm with arrythmia.  Asymptomatic.Sandra Owens said she down to smoking 3 cigarettes a day. Will continue to encourage smoking cessation.[place] LighterMaria Alauna Hayden, RN,BSN 01/10/2016 3:33 PM

## 2016-01-10 ENCOUNTER — Encounter (HOSPITAL_COMMUNITY): Payer: Self-pay | Admitting: Internal Medicine

## 2016-01-10 ENCOUNTER — Ambulatory Visit (HOSPITAL_BASED_OUTPATIENT_CLINIC_OR_DEPARTMENT_OTHER)
Admission: RE | Admit: 2016-01-10 | Discharge: 2016-01-10 | Disposition: A | Discharge status: Home / Self Care | Payer: Managed Care, Other (non HMO) | Source: Ambulatory Visit | Attending: Internal Medicine | Admitting: Internal Medicine

## 2016-01-10 ENCOUNTER — Ambulatory Visit (HOSPITAL_COMMUNITY)
Admission: RE | Admit: 2016-01-10 | Discharge: 2016-01-10 | Disposition: A | Discharge status: Home / Self Care | Payer: Managed Care, Other (non HMO) | Source: Ambulatory Visit | Attending: Internal Medicine | Admitting: Internal Medicine

## 2016-01-10 ENCOUNTER — Encounter (HOSPITAL_COMMUNITY): Payer: Self-pay

## 2016-01-10 VITALS — BP 114/52 | HR 62 | Wt 173.8 lb

## 2016-01-10 DIAGNOSIS — Z720 Tobacco use: Secondary | ICD-10-CM | POA: Diagnosis not present

## 2016-01-10 DIAGNOSIS — I2510 Atherosclerotic heart disease of native coronary artery without angina pectoris: Secondary | ICD-10-CM | POA: Diagnosis not present

## 2016-01-10 DIAGNOSIS — I5022 Chronic systolic (congestive) heart failure: Secondary | ICD-10-CM

## 2016-01-10 DIAGNOSIS — E119 Type 2 diabetes mellitus without complications: Secondary | ICD-10-CM | POA: Diagnosis not present

## 2016-01-10 DIAGNOSIS — I739 Peripheral vascular disease, unspecified: Secondary | ICD-10-CM | POA: Diagnosis not present

## 2016-01-10 DIAGNOSIS — I509 Heart failure, unspecified: Secondary | ICD-10-CM | POA: Diagnosis present

## 2016-01-10 DIAGNOSIS — I110 Hypertensive heart disease with heart failure: Secondary | ICD-10-CM | POA: Insufficient documentation

## 2016-01-10 NOTE — Patient Instructions (Signed)
Stop Digoxin  We will contact you in 3 months to schedule your next appointment.

## 2016-01-10 NOTE — Progress Notes (Addendum)
Patient ID: Laqueisha Bateman, female   DOB: 04/10/1966, 50 y.o.   MRN: [id number] .  Patient ID: Audrey Alonzo, female   DOB: [birth year], 50 y.o.   MRN: [id number] . PCP: Primary Cardiologist:  HPI:  Azelea is a 49 y/o woman with obesity, HTN, DM2, CAD chronic systolic HF due to primarily NICM (diagnosed 7/16).   Admitted 6/17 with NSTEMI and decompensated HF. Echo showed EF of 15-20% with diffuse hypokinesis. Had right and left heart cath on 10/1215 which showed severe biventricular CHF EF less than 20%, severe pulmonary hypertension with PA pressure 19mm Hg. severe 95% mid LAD stenosis, new likely culprit for patient's ACS. Had staged PCI of LAD on 6/14 complicated with transient cardiogenic shock during the interventional part of cardiac cath du to jailing of a septal branch   She presents today for regular follow up. At last visit increased Entresto to 97/103 and referred her to CR.  Also had ABIs which showed moderately to severely reduced ABI bilaterally with evidence of SFA occlusion bilaterally with reconstitution via collaterals. Referred to Dr. Kirke Corin who recommended trial of exercise and smoking cessation. Stays busy. Doing all ADLs and all housework without any problem. Denies any dyspnea or CP. Limited by legs but says she doesn't stop. Trying to walk a mile a day. Watches step on phone.   Distance she can walk varies from 50 feet to the whole mall before pain. Weight down 7 pounds. Using patch and gum. Only smoking 1-2 cigs  Echo images reviewed personally EF 45-50%.   Cath 6/17  LAD 90% LCX 70% RCA 40%   RA 20 RV 67/22 PA 76/33 (53) PCWP 44 CO/CI = 3.3/1.7   ROS: All systems negative except as listed in HPI, PMH and Problem List.  SH:  Social History   Social History  . Marital status: Single    Spouse name: N/A  . Number of children: 3  . Years of education: N/A   Occupational History  . Not on file.   Social History Main Topics  . Smoking status:  Former Smoker    Packs/day: 0.50    Years: 30.00    Types: Cigarettes    Quit date: 10/12/2015  . Smokeless tobacco: Not on file  . Alcohol use No  . Drug use: No  . Sexual activity: Not on file   Other Topics Concern  . 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         FH:  Family History  Problem Relation Age of Onset  . Hypertension Father   . Diabetes Mellitus II Father   . Hypertension Mother   . Diabetes Mellitus II Mother   . CAD Father     CABG in his 68s  . Stroke Paternal Grandmother     Past Medical History:  Diagnosis Date  . Essential hypertension   . GERD (gastroesophageal reflux disease)   . Low back pain   . Systolic CHF (HCC)    a. Found to have new low EF 11/2014 with abnormal nuc.  . Type 2 diabetes mellitus (HCC)     Current Outpatient Prescriptions  Medication Sig Dispense Refill  . aspirin EC 81 MG tablet Take 1 tablet (81 mg total) by mouth daily. 90 tablet 3  . atorvastatin (LIPITOR) 80 MG tablet Take 1 tablet (80 mg total) by mouth daily at 6 PM. 30 tablet 6  . carvedilol (COREG) 3.125 MG  tablet Take 1 tablet (3.125 mg total) by mouth 2 (two) times daily. 180 tablet 3  . citalopram (CELEXA) 10 MG tablet Take 1 tablet (10 mg total) by mouth daily. 30 tablet 6  . digoxin (LANOXIN) 0.125 MG tablet Take 1 tablet (0.125 mg total) by mouth daily. 30 tablet 3  . famotidine (PEPCID AC) 10 MG chewable tablet Chew 10 mg by mouth daily as needed for heartburn.    . furosemide (LASIX) 20 MG tablet Take 1 tablet (20 mg total) by mouth daily. 30 tablet 3  . glucose blood test strip 1 each by Other route See admin instructions. Check blood sugar once daily    . isosorbide-hydrALAZINE (BIDIL) 20-37.5 MG tablet Take 1 tablet by mouth 3 (three) times daily. 90 tablet 3  . nicotine (NICODERM CQ - DOSED IN MG/24 HOURS) 14 mg/24hr patch Place 1 patch (14 mg total) onto the skin daily. 28 patch 3  . nitroGLYCERIN  (NITROSTAT) 0.4 MG SL tablet Place 1 tablet (0.4 mg total) under the tongue every 5 (five) minutes as needed for chest pain. 30 tablet 12  . sacubitril-valsartan (ENTRESTO) 97-103 MG Take 1 tablet by mouth 2 (two) times daily. 60 tablet 3  . sitaGLIPtin (JANUVIA) 100 MG tablet Take 100 mg by mouth daily.    Marland Kitchen. spironolactone (ALDACTONE) 25 MG tablet Take 0.5 tablets (12.5 mg total) by mouth daily. 30 tablet 3  . ticagrelor (BRILINTA) 90 MG TABS tablet Take 1 tablet (90 mg total) by mouth 2 (two) times daily. 60 tablet 3  . tobramycin (TOBREX) 0.3 % ophthalmic solution Place 1-2 drops into the left eye as needed (for eye infection).     No current facility-administered medications for this encounter.     Vitals:   01/10/16 1104  BP: (!) 114/52  Pulse: 62  SpO2: 100%  Weight: 173 lb 12.8 oz (78.8 kg)   Wt Readings from Last 3 Encounters:  01/10/16 173 lb 12.8 oz (78.8 kg)  01/09/16 173 lb 1 oz (78.5 kg)  12/06/15 180 lb (81.6 kg)     PHYSICAL EXAM:  General:  Well appearing. No resp difficulty HEENT: normal Neck: supple. JVP flat. Carotids 2+ bilaterally; no bruits. No lymphadenopathy or thryomegaly appreciated. Cor: PMI normal. Tachycardia regular. No rubs, gallops or murmurs. Lungs: clear Abdomen: obese soft, nontender, nondistended. No hepatosplenomegaly. No bruits or masses. Good bowel sounds. Extremities: no cyanosis, clubbing, rash, edema Neuro: alert & orientedx3, cranial nerves grossly intact. Moves all 4 extremities w/o difficulty. Affect pleasant.   ASSESSMENT & PLAN: 1. Chronic systolic heart failure  - due mostly to NICM. EF 15-20% with biventricular dysfunction in 6/17  - echo today with EF 50%. Much improved. NYHA II. Volume status looks good  - can stop digoxin  - continue Entresto, carvedilol, Bidil and spiro. HR too low to titrate carvedilol 2. CAD s/p NSTEMI 6/17   -- s/p DES to LAD 6/17   --continue ASA, statin, b-blocker and Brilinta 3. Tobacco abuse    --has cut back dramatically. Trying to quit.  4. OSA 5. DM2 6. PAD with claudication   --ABI suggested of bilateral SFA occlusion. Follows with Dr. Rubie MaidArida  Kilynn Fitzsimmons,MD 11:32 AM

## 2016-01-10 NOTE — Progress Notes (Signed)
  Echocardiogram 2D Echocardiogram has been performed.  Nolon Rod 01/10/2016, 10:47 AM

## 2016-01-11 ENCOUNTER — Encounter (HOSPITAL_COMMUNITY): Payer: Self-pay

## 2016-01-11 ENCOUNTER — Encounter (HOSPITAL_COMMUNITY): Payer: Managed Care, Other (non HMO)

## 2016-01-11 ENCOUNTER — Encounter (HOSPITAL_COMMUNITY)
Admission: RE | Admit: 2016-01-11 | Discharge: 2016-01-11 | Disposition: A | Discharge status: Home / Self Care | Payer: Managed Care, Other (non HMO) | Source: Ambulatory Visit | Attending: Internal Medicine | Admitting: Internal Medicine

## 2016-01-11 DIAGNOSIS — Z955 Presence of coronary angioplasty implant and graft: Secondary | ICD-10-CM | POA: Diagnosis not present

## 2016-01-11 LAB — GLUCOSE, CAPILLARY
Glucose-Capillary: 161 mg/dL — ABNORMAL HIGH (ref 65–99)
Glucose-Capillary: 148 mg/dL — ABNORMAL HIGH (ref 65–99)

## 2016-01-11 NOTE — Progress Notes (Signed)
Daily Session Note  Patient Details  Name: Jentry Mcqueary MRN: [id number] Date of Birth: 1965-10-26 Referring Provider:   Flowsheet Row CARDIAC REHAB PHASE II EXERCISE from 01/09/2016 in [place]  Referring Provider  Glori Bickers MD      Encounter Date: 01/11/2016  Check In:   Capillary Blood Glucose: Results for orders placed or performed during the hospital encounter of 01/11/16 (from the past 24 hour(s))  Glucose, capillary     Status: Abnormal   Collection Time: 01/11/16  3:11 PM  Result Value Ref Range   Glucose-Capillary 161 (H) 65 - 99 mg/dL  Glucose, capillary     Status: Abnormal   Collection Time: 01/11/16  3:59 PM  Result Value Ref Range   Glucose-Capillary 148 (H) 65 - 99 mg/dL     Goals Met:  Exercise tolerated well  Goals Unmet:  Not Applicable  Comments: Pt started cardiac rehab today.  Pt tolerated light exercise without difficulty. VSS, telemetry-sinus rhythm asymptomatic.  Medication list reconciled. Pt denies barriers to medicaiton compliance.  PSYCHOSOCIAL ASSESSMENT:  PHQ-1. Pt exhibits positive coping skills, hopeful outlook with supportive family. No psychosocial needs identified at this time, no psychosocial interventions necessary.  Pt admits to feeling depressed while out of work and is looking forward to returning next week. Pt is smoking 4 cigarettes per day, which she reports is decreased for her. Pt is also using nicotine gum and patches. Pt cautioned against smoking with patch and gum. Pt also advised importance of calling  1800quitnow and counseled on importance of quitting.    Pt oriented to exercise equipment and routine.    Understanding verbalized.   Dr. Fransico Him is Medical Director for Cardiac Rehab at [facility].

## 2016-01-14 ENCOUNTER — Encounter (HOSPITAL_COMMUNITY): Payer: Managed Care, Other (non HMO)

## 2016-01-14 ENCOUNTER — Encounter (HOSPITAL_COMMUNITY)
Admission: RE | Admit: 2016-01-14 | Discharge: 2016-01-14 | Disposition: A | Discharge status: Home / Self Care | Payer: Managed Care, Other (non HMO) | Source: Ambulatory Visit | Attending: Internal Medicine | Admitting: Internal Medicine

## 2016-01-14 DIAGNOSIS — Z955 Presence of coronary angioplasty implant and graft: Secondary | ICD-10-CM

## 2016-01-14 DIAGNOSIS — I214 Non-ST elevation (NSTEMI) myocardial infarction: Secondary | ICD-10-CM

## 2016-01-14 LAB — GLUCOSE, CAPILLARY: GLUCOSE-CAPILLARY: 155 mg/dL — AB (ref 65–99)

## 2016-01-15 DIAGNOSIS — Z736 Limitation of activities due to disability: Secondary | ICD-10-CM

## 2016-01-16 ENCOUNTER — Encounter (HOSPITAL_COMMUNITY)
Admission: RE | Admit: 2016-01-16 | Discharge: 2016-01-16 | Disposition: A | Discharge status: Home / Self Care | Payer: Managed Care, Other (non HMO) | Source: Ambulatory Visit | Attending: Internal Medicine | Admitting: Internal Medicine

## 2016-01-16 ENCOUNTER — Encounter (HOSPITAL_COMMUNITY): Payer: Managed Care, Other (non HMO)

## 2016-01-16 DIAGNOSIS — Z955 Presence of coronary angioplasty implant and graft: Secondary | ICD-10-CM | POA: Diagnosis not present

## 2016-01-16 DIAGNOSIS — I214 Non-ST elevation (NSTEMI) myocardial infarction: Secondary | ICD-10-CM

## 2016-01-16 LAB — GLUCOSE, CAPILLARY: Glucose-Capillary: 168 mg/dL — ABNORMAL HIGH (ref 65–99)

## 2016-01-18 ENCOUNTER — Encounter (HOSPITAL_COMMUNITY): Payer: Managed Care, Other (non HMO)

## 2016-01-18 ENCOUNTER — Encounter (HOSPITAL_COMMUNITY)
Admission: RE | Admit: 2016-01-18 | Discharge: 2016-01-18 | Disposition: A | Discharge status: Home / Self Care | Payer: Managed Care, Other (non HMO) | Source: Ambulatory Visit | Attending: Internal Medicine | Admitting: Internal Medicine

## 2016-01-18 DIAGNOSIS — Z955 Presence of coronary angioplasty implant and graft: Secondary | ICD-10-CM | POA: Diagnosis not present

## 2016-01-18 DIAGNOSIS — I214 Non-ST elevation (NSTEMI) myocardial infarction: Secondary | ICD-10-CM

## 2016-01-18 LAB — GLUCOSE, CAPILLARY: GLUCOSE-CAPILLARY: 201 mg/dL — AB (ref 65–99)

## 2016-01-18 NOTE — Progress Notes (Signed)
Cardiac Individual Treatment Plan  Patient Details  Name: Sandra Owens MRN: [id number] Date of Birth: Feb 29, 1966 Referring Provider:   Flowsheet Row CARDIAC REHAB PHASE II EXERCISE from 01/09/2016 in [facility] CARDIAC [facility]  Referring Provider  Arvilla Meres MD      Initial Encounter Date:  Flowsheet Row CARDIAC REHAB PHASE II EXERCISE from 01/09/2016 in [facility] CARDIAC REHAB  Date  01/09/16  Referring Provider  Arvilla Meres MD      Visit Diagnosis: NSTEMI (non-ST elevated myocardial infarction) [facility])  Stented coronary artery  Patient's Home Medications on Admission:  Current Outpatient Prescriptions:  .  aspirin EC 81 MG tablet, Take 1 tablet (81 mg total) by mouth daily., Disp: 90 tablet, Rfl: 3 .  atorvastatin (LIPITOR) 80 MG tablet, Take 1 tablet (80 mg total) by mouth daily at 6 PM., Disp: 30 tablet, Rfl: 6 .  carvedilol (COREG) 3.125 MG tablet, Take 1 tablet (3.125 mg total) by mouth 2 (two) times daily., Disp: 180 tablet, Rfl: 3 .  citalopram (CELEXA) 10 MG tablet, Take 1 tablet (10 mg total) by mouth daily., Disp: 30 tablet, Rfl: 6 .  famotidine (PEPCID AC) 10 MG chewable tablet, Chew 10 mg by mouth daily as needed for heartburn., Disp: , Rfl:  .  furosemide (LASIX) 20 MG tablet, Take 1 tablet (20 mg total) by mouth daily., Disp: 30 tablet, Rfl: 3 .  glucose blood test strip, 1 each by Other route See admin instructions. Check blood sugar once daily, Disp: , Rfl:  .  isosorbide-hydrALAZINE (BIDIL) 20-37.5 MG tablet, Take 1 tablet by mouth 3 (three) times daily., Disp: 90 tablet, Rfl: 3 .  nicotine (NICODERM CQ - DOSED IN MG/24 HOURS) 14 mg/24hr patch, Place 1 patch (14 mg total) onto the skin daily., Disp: 28 patch, Rfl: 3 .  nitroGLYCERIN (NITROSTAT) 0.4 MG SL tablet, Place 1 tablet (0.4 mg total) under the tongue every 5 (five) minutes as needed for chest pain., Disp: 30 tablet, Rfl: 12 .  sacubitril-valsartan (ENTRESTO)  97-103 MG, Take 1 tablet by mouth 2 (two) times daily., Disp: 60 tablet, Rfl: 3 .  sitaGLIPtin (JANUVIA) 100 MG tablet, Take 100 mg by mouth daily., Disp: , Rfl:  .  spironolactone (ALDACTONE) 25 MG tablet, Take 0.5 tablets (12.5 mg total) by mouth daily., Disp: 30 tablet, Rfl: 3 .  ticagrelor (BRILINTA) 90 MG TABS tablet, Take 1 tablet (90 mg total) by mouth 2 (two) times daily., Disp: 60 tablet, Rfl: 3 .  tobramycin (TOBREX) 0.3 % ophthalmic solution, Place 1-2 drops into the left eye as needed (for eye infection)., Disp: , Rfl:   Past Medical History: Past Medical History:  Diagnosis Date  . Coronary artery disease   . Essential hypertension   . GERD (gastroesophageal reflux disease)   . Hyperlipidemia   . Low back pain   . Systolic CHF (HCC)    a. Found to have new low EF 11/2014 with abnormal nuc.  . Type 2 diabetes mellitus (HCC)     Tobacco Use: History  Smoking Status  . Current Every Day Smoker  . Packs/day: 0.25  . Years: 30.00  . Types: Cigarettes  . Last attempt to quit: 10/12/2015  Smokeless Tobacco  . Never Used    Labs: Recent Review Flowsheet Data    Labs for ITP Cardiac and Pulmonary Rehab Latest Ref Rng & Units 10/15/2015 10/16/2015 10/17/2015 10/18/2015 10/19/2015   Cholestrol 0 - 200 mg/dL - - - - -  LDLCALC 0 - 99 mg/dL - - - - -   HDL >16>40 mg/dL - - - - -   Trlycerides <150 mg/dL - - - - -   Hemoglobin A1c 4.8 - 5.6 % - - - - -   PHART 7.350 - 7.450 - - - - -   PCO2ART 35.0 - 45.0 mmHg - - - - -   HCO3 20.0 - 24.0 mEq/L 24.0 - - - -   TCO2 0 - 100 mmol/L 26 - - - -   ACIDBASEDEF 0.0 - 2.0 mmol/L 3.0(H) - - - -   O2SAT % 36.0 76.3 64.2 67.7 69.5      Capillary Blood Glucose: Lab Results  Component Value Date   GLUCAP 168 (H) 01/16/2016   GLUCAP 155 (H) 01/14/2016   GLUCAP 148 (H) 01/11/2016   GLUCAP 161 (H) 01/11/2016   GLUCAP 111 (H) 01/09/2016     Exercise Target Goals:    Exercise Program Goal: Individual exercise prescription set with  THRR, safety & activity barriers. Participant demonstrates ability to understand and report RPE using BORG scale, to self-measure pulse accurately, and to acknowledge the importance of the exercise prescription.  Exercise Prescription Goal: Starting with aerobic activity 30 plus minutes a day, 3 days per week for initial exercise prescription. Provide home exercise prescription and guidelines that participant acknowledges understanding prior to discharge.  Activity Barriers & Risk Stratification:     Activity Barriers & Cardiac Risk Stratification - 01/09/16 1740      Activity Barriers & Cardiac Risk Stratification   Activity Barriers Other (comment)   Comments PAD, bilateral claudication   Cardiac Risk Stratification High      6 Minute Walk:     6 Minute Walk    Row Name 01/09/16 1750         6 Minute Walk   Phase Initial     Distance 1113 feet     Walk Time 6 minutes     # of Rest Breaks 0     MPH 2.11     METS 3.62     RPE 13     VO2 Peak 12.67     Symptoms Yes (comment)     Comments bilateral leg pain     Resting HR 81 bpm     Resting BP 110/70     Max Ex. HR 111 bpm     Max Ex. BP 140/78     2 Minute Post BP 130/72        Initial Exercise Prescription:     Initial Exercise Prescription - 01/09/16 1700      Date of Initial Exercise RX and Referring Provider   Date 01/09/16   Referring Provider Arvilla MeresBensimhon, Daniel MD     Bike   Level 1.1   Minutes 10   METs 3.68     NuStep   Level 3   Minutes 10   METs 2.5     Track   Laps 9   Minutes 10   METs 2.57     Prescription Details   Frequency (times per week) 3   Duration Progress to 30 minutes of continuous aerobic without signs/symptoms of physical distress     Intensity   THRR 40-80% of Max Heartrate 68-135   Ratings of Perceived Exertion 11-13   Perceived Dyspnea 0-4     Progression   Progression Continue to progress workloads to maintain intensity without signs/symptoms of physical  distress.  Resistance Training   Training Prescription Yes   Weight 1   Reps 10-12      Perform Capillary Blood Glucose checks as needed.  Exercise Prescription Changes:     Exercise Prescription Changes    Row Name 01/14/16 1600             Response to Exercise   Blood Pressure (Admit) 124/70       Blood Pressure (Exercise) 140/80       Blood Pressure (Exit) 122/70       Heart Rate (Admit) 97 bpm       Heart Rate (Exercise) 125 bpm       Heart Rate (Exit) 94 bpm       Rating of Perceived Exertion (Exercise) 15       Symptoms -  pt complained of claudication pain in LE       Duration Progress to 30 minutes of continuous aerobic without signs/symptoms of physical distress       Intensity THRR unchanged         Progression   Progression Continue to progress workloads to maintain intensity without signs/symptoms of physical distress.       Average METs 3         Resistance Training   Training Prescription Yes       Weight 1       Reps 10-12         Bike   Level 1.1       Minutes 10       METs 3.68         NuStep   Level 3       Minutes 10       METs 2.5         Track   Laps 9       Minutes 10       METs 2.57          Exercise Comments:     Exercise Comments    Row Name 01/14/16 1656           Exercise Comments Pt is tolerating exercise very well; currently there are no changes to current Ex. Rx          Discharge Exercise Prescription (Final Exercise Prescription Changes):     Exercise Prescription Changes - 01/14/16 1600      Response to Exercise   Blood Pressure (Admit) 124/70   Blood Pressure (Exercise) 140/80   Blood Pressure (Exit) 122/70   Heart Rate (Admit) 97 bpm   Heart Rate (Exercise) 125 bpm   Heart Rate (Exit) 94 bpm   Rating of Perceived Exertion (Exercise) 15   Symptoms --  pt complained of claudication pain in LE   Duration Progress to 30 minutes of continuous aerobic without signs/symptoms of physical distress    Intensity THRR unchanged     Progression   Progression Continue to progress workloads to maintain intensity without signs/symptoms of physical distress.   Average METs 3     Resistance Training   Training Prescription Yes   Weight 1   Reps 10-12     Bike   Level 1.1   Minutes 10   METs 3.68     NuStep   Level 3   Minutes 10   METs 2.5     Track   Laps 9   Minutes 10   METs 2.57      Nutrition:  Target Goals: Understanding of nutrition guidelines, daily  intake of sodium 1500mg , cholesterol 200mg , calories 30% from fat and 7% or less from saturated fats, daily to have 5 or more servings of fruits and vegetables.  Biometrics:     Pre Biometrics - 01/09/16 1749      Pre Biometrics   Height 5\' 4"  (1.626 m)   Weight 173 lb 1 oz (78.5 kg)   Waist Circumference 39.5 inches   Hip Circumference 44.75 inches   Waist to Hip Ratio 0.88 %   BMI (Calculated) 29.8   Triceps Skinfold 49 mm   % Body Fat 43.5 %   Grip Strength 38.5 kg   Flexibility 11 in   Single Leg Stand 41.69 seconds       Nutrition Therapy Plan and Nutrition Goals:     Nutrition Therapy & Goals - 01/11/16 1400      Nutrition Therapy   Diet Carb Modified, Therapeutic Lifestyle Changes     Personal Nutrition Goals   Personal Goal #1 1-2 lb wt loss/week to a wt loss goal of 6-24 lb at graduation from Cardiac Rehab   Personal Goal #2 Improved glycemic control as evidenced by an A1c trending toward less than 7.0     Intervention Plan   Intervention Prescribe, educate and counsel regarding individualized specific dietary modifications aiming towards targeted core components such as weight, hypertension, lipid management, diabetes, heart failure and other comorbidities.   Expected Outcomes Short Term Goal: Understand basic principles of dietary content, such as calories, fat, sodium, cholesterol and nutrients.;Long Term Goal: Adherence to prescribed nutrition plan.      Nutrition Discharge: Nutrition  Scores:     Nutrition Assessments - 01/11/16 1400      MEDFICTS Scores   Pre Score 32      Nutrition Goals Re-Evaluation:   Psychosocial: Target Goals: Acknowledge presence or absence of depression, maximize coping skills, provide positive support system. Participant is able to verbalize types and ability to use techniques and skills needed for reducing stress and depression.  Initial Review & Psychosocial Screening:     Initial Psych Review & Screening - 01/10/16 1530      Family Dynamics   Good Support System? Yes     Barriers   Psychosocial barriers to participate in program There are no identifiable barriers or psychosocial needs.     Screening Interventions   Interventions Encouraged to exercise      Quality of Life Scores:     Quality of Life - 01/11/16 1701      Quality of Life Scores   GLOBAL Pre --  pt is concened about her physical limitations and social isolation from not being able to work during her recovery. pt is looking forward to returning to work next week.       PHQ-9: Recent Review Flowsheet Data    Depression screen [facility] 2/9 01/11/2016 01/09/2016   Decreased Interest 0 0   Down, Depressed, Hopeless 0 0   PHQ - 2 Score 0 0      Psychosocial Evaluation and Intervention:   Psychosocial Re-Evaluation:   Vocational Rehabilitation: Provide vocational rehab assistance to qualifying candidates.   Vocational Rehab Evaluation & Intervention:     Vocational Rehab - 01/10/16 1529      Initial Vocational Rehab Evaluation & Intervention   Assessment shows need for Vocational Rehabilitation No      Education: Education Goals: Education classes will be provided on a weekly basis, covering required topics. Participant will state understanding/return demonstration of topics presented.  Learning  Barriers/Preferences:     Learning Barriers/Preferences - 01/09/16 1740      Learning Barriers/Preferences   Learning Barriers None   Learning  Preferences Video;Pictoral      Education Topics: Count Your Pulse:  -Group instruction provided by verbal instruction, demonstration, patient participation and written materials to support subject.  Instructors address importance of being able to find your pulse and how to count your pulse when at home without a heart monitor.  Patients get hands on experience counting their pulse with staff help and individually.   Heart Attack, Angina, and Risk Factor Modification:  -Group instruction provided by verbal instruction, video, and written materials to support subject.  Instructors address signs and symptoms of angina and heart attacks.    Also discuss risk factors for heart disease and how to make changes to improve heart health risk factors.   Functional Fitness:  -Group instruction provided by verbal instruction, demonstration, patient participation, and written materials to support subject.  Instructors address safety measures for doing things around the house.  Discuss how to get up and down off the floor, how to pick things up properly, how to safely get out of a chair without assistance, and balance training.   Meditation and Mindfulness:  -Group instruction provided by verbal instruction, patient participation, and written materials to support subject.  Instructor addresses importance of mindfulness and meditation practice to help reduce stress and improve awareness.  Instructor also leads participants through a meditation exercise.    Stretching for Flexibility and Mobility:  -Group instruction provided by verbal instruction, patient participation, and written materials to support subject.  Instructors lead participants through series of stretches that are designed to increase flexibility thus improving mobility.  These stretches are additional exercise for major muscle groups that are typically performed during regular warm up and cool down.   Hands Only CPR Anytime:  -Group  instruction provided by verbal instruction, video, patient participation and written materials to support subject.  Instructors co-teach with AHA video for hands only CPR.  Participants get hands on experience with mannequins.   Nutrition I class: Heart Healthy Eating:  -Group instruction provided by PowerPoint slides, verbal discussion, and written materials to support subject matter. The instructor gives an explanation and review of the Therapeutic Lifestyle Changes diet recommendations, which includes a discussion on lipid goals, dietary fat, sodium, fiber, plant stanol/sterol esters, sugar, and the components of a well-balanced, healthy diet.   Nutrition II class: Lifestyle Skills:  -Group instruction provided by PowerPoint slides, verbal discussion, and written materials to support subject matter. The instructor gives an explanation and review of label reading, grocery shopping for heart health, heart healthy recipe modifications, and ways to make healthier choices when eating out.   Diabetes Question & Answer:  -Group instruction provided by PowerPoint slides, verbal discussion, and written materials to support subject matter. The instructor gives an explanation and review of diabetes co-morbidities, pre- and post-prandial blood glucose goals, pre-exercise blood glucose goals, signs, symptoms, and treatment of hypoglycemia and hyperglycemia, and foot care basics. Flowsheet Row CARDIAC REHAB PHASE II EXERCISE from 01/16/2016 in [facility] CARDIAC REHAB  Date  01/11/16  Educator  RD  Instruction Review Code  2- meets goals/outcomes      Diabetes Blitz:  -Group instruction provided by PowerPoint slides, verbal discussion, and written materials to support subject matter. The instructor gives an explanation and review of the physiology behind type 1 and type 2 diabetes, diabetes medications and rational behind using different  medications, pre- and post-prandial blood glucose  recommendations and Hemoglobin A1c goals, diabetes diet, and exercise including blood glucose guidelines for exercising safely.    Portion Distortion:  -Group instruction provided by PowerPoint slides, verbal discussion, written materials, and food models to support subject matter. The instructor gives an explanation of serving size versus portion size, changes in portions sizes over the last 20 years, and what consists of a serving from each food group.   Stress Management:  -Group instruction provided by verbal instruction, video, and written materials to support subject matter.  Instructors review role of stress in heart disease and how to cope with stress positively.     Exercising on Your Own:  -Group instruction provided by verbal instruction, power point, and written materials to support subject.  Instructors discuss benefits of exercise, components of exercise, frequency and intensity of exercise, and end points for exercise.  Also discuss use of nitroglycerin and activating EMS.  Review options of places to exercise outside of rehab.  Review guidelines for sex with heart disease.   Cardiac Drugs I:  -Group instruction provided by verbal instruction and written materials to support subject.  Instructor reviews cardiac drug classes: antiplatelets, anticoagulants, beta blockers, and statins.  Instructor discusses reasons, side effects, and lifestyle considerations for each drug class.   Cardiac Drugs II:  -Group instruction provided by verbal instruction and written materials to support subject.  Instructor reviews cardiac drug classes: angiotensin converting enzyme inhibitors (ACE-I), angiotensin II receptor blockers (ARBs), nitrates, and calcium channel blockers.  Instructor discusses reasons, side effects, and lifestyle considerations for each drug class. Flowsheet Row CARDIAC REHAB PHASE II EXERCISE from 01/16/2016 in [facility] CARDIAC REHAB  Date  01/16/16   Educator  Pharm D  Instruction Review Code  2- meets goals/outcomes      Anatomy and Physiology of the Circulatory System:  -Group instruction provided by verbal instruction, video, and written materials to support subject.  Reviews functional anatomy of heart, how it relates to various diagnoses, and what role the heart plays in the overall system. Flowsheet Row CARDIAC REHAB PHASE II EXERCISE from 01/16/2016 in [facility] CARDIAC REHAB  Date  01/09/16  Instruction Review Code  2- meets goals/outcomes      Knowledge Questionnaire Score:     Knowledge Questionnaire Score - 01/09/16 1747      Knowledge Questionnaire Score   Pre Score 25/28      Core Components/Risk Factors/Patient Goals at Admission:     Personal Goals and Risk Factors at Admission - 01/09/16 1801      Core Components/Risk Factors/Patient Goals on Admission    Weight Management Obesity;Yes   Intervention Weight Management: Develop a combined nutrition and exercise program designed to reach desired caloric intake, while maintaining appropriate intake of nutrient and fiber, sodium and fats, and appropriate energy expenditure required for the weight goal.;Weight Management: Provide education and appropriate resources to help participant work on and attain dietary goals.;Weight Management/Obesity: Establish reasonable short term and long term weight goals.;Obesity: Provide education and appropriate resources to help participant work on and attain dietary goals.   Admit Weight 173 lb 1 oz (78.5 kg)   Goal Weight: Short Term 167 lb 11.2 oz (76.1 kg)   Goal Weight: Long Term 162 lb 11.2 oz (73.8 kg)   Expected Outcomes Short Term: Continue to assess and modify interventions until short term weight is achieved;Long Term: Adherence to nutrition and physical activity/exercise program aimed toward attainment of established  weight goal;Weight Loss: Understanding of general recommendations for a balanced  deficit meal plan, which promotes 1-2 lb weight loss per week and includes a negative energy balance of [phone] kcal/d   Sedentary Yes   Intervention Provide advice, education, support and counseling about physical activity/exercise needs.;Develop an individualized exercise prescription for aerobic and resistive training based on initial evaluation findings, risk stratification, comorbidities and participant's personal goals.   Expected Outcomes Achievement of increased cardiorespiratory fitness and enhanced flexibility, muscular endurance and strength shown through measurements of functional capacity and personal statement of participant.   Increase Strength and Stamina Yes   Intervention Provide advice, education, support and counseling about physical activity/exercise needs.;Develop an individualized exercise prescription for aerobic and resistive training based on initial evaluation findings, risk stratification, comorbidities and participant's personal goals.   Expected Outcomes Achievement of increased cardiorespiratory fitness and enhanced flexibility, muscular endurance and strength shown through measurements of functional capacity and personal statement of participant.   Tobacco Cessation Yes   Intervention Assist the participant in steps to quit. Provide individualized education and counseling about committing to Tobacco Cessation, relapse prevention, and pharmacological support that can be provided by physician.;Education officer, environmental, assist with locating and accessing local/national Quit Smoking programs, and support quit date choice.   Expected Outcomes Short Term: Will demonstrate readiness to quit, by selecting a quit date.;Long Term: Complete abstinence from all tobacco products for at least 12 months from quit date.;Short Term: Will quit all tobacco product use, adhering to prevention of relapse plan.   Diabetes Yes   Intervention Provide education about signs/symptoms and action  to take for hypo/hyperglycemia.;Provide education about proper nutrition, including hydration, and aerobic/resistive exercise prescription along with prescribed medications to achieve blood glucose in normal ranges: Fasting glucose 65-99 mg/dL   Expected Outcomes Short Term: Participant verbalizes understanding of the signs/symptoms and immediate care of hyper/hypoglycemia, proper foot care and importance of medication, aerobic/resistive exercise and nutrition plan for blood glucose control.;Long Term: Attainment of HbA1C < 7%.   Hypertension Yes   Intervention Provide education on lifestyle modifcations including regular physical activity/exercise, weight management, moderate sodium restriction and increased consumption of fresh fruit, vegetables, and low fat dairy, alcohol moderation, and smoking cessation.;Monitor prescription use compliance.   Expected Outcomes Short Term: Continued assessment and intervention until BP is < 140/29mm HG in hypertensive participants. < 130/67mm HG in hypertensive participants with diabetes, heart failure or chronic kidney disease.;Long Term: Maintenance of blood pressure at goal levels.   Lipids Yes   Intervention Provide education and support for participant on nutrition & aerobic/resistive exercise along with prescribed medications to achieve LDL 70mg , HDL >40mg .   Expected Outcomes Short Term: Participant states understanding of desired cholesterol values and is compliant with medications prescribed. Participant is following exercise prescription and nutrition guidelines.;Long Term: Cholesterol controlled with medications as prescribed, with individualized exercise RX and with personalized nutrition plan. Value goals: LDL < 70mg , HDL > 40 mg.   Stress Yes   Intervention Offer individual and/or small group education and counseling on adjustment to heart disease, stress management and health-related lifestyle change. Teach and support self-help strategies.   Expected  Outcomes Short Term: Participant demonstrates changes in health-related behavior, relaxation and other stress management skills, ability to obtain effective social support, and compliance with psychotropic medications if prescribed.;Long Term: Emotional wellbeing is indicated by absence of clinically significant psychosocial distress or social isolation.   Personal Goal Other Yes   Personal Goal Decreased claudication symptoms. Improve heart healthy lifestyle.  Intervention Provide educaiton regarding walking program and parameters to assist with building collateral circulaiton to help decrease claudication symptoms. Provide education regarding risk factor modification and heart healthy lifestyle.    Expected Outcomes Patient will be more knowledgeable about heart healthy lifestyle and adopt better habits to improve heart health.      Core Components/Risk Factors/Patient Goals Review:    Core Components/Risk Factors/Patient Goals at Discharge (Final Review):    ITP Comments:     ITP Comments    Row Name 01/09/16 1801           ITP Comments Medical Director- Dr. Armanda Magic MD          Comments: Pt is making expected progress toward personal goals after completing 3 sessions. Recommend continued exercise and life style modification education including  stress management and relaxation techniques to decrease cardiac risk profile.

## 2016-01-21 ENCOUNTER — Encounter (HOSPITAL_COMMUNITY): Payer: Self-pay

## 2016-01-21 ENCOUNTER — Encounter (HOSPITAL_COMMUNITY): Payer: Managed Care, Other (non HMO)

## 2016-01-21 ENCOUNTER — Encounter (HOSPITAL_COMMUNITY)
Admission: RE | Admit: 2016-01-21 | Discharge: 2016-01-21 | Disposition: A | Discharge status: Home / Self Care | Payer: Managed Care, Other (non HMO) | Source: Ambulatory Visit | Attending: Internal Medicine | Admitting: Internal Medicine

## 2016-01-21 DIAGNOSIS — Z955 Presence of coronary angioplasty implant and graft: Secondary | ICD-10-CM

## 2016-01-21 DIAGNOSIS — I214 Non-ST elevation (NSTEMI) myocardial infarction: Secondary | ICD-10-CM

## 2016-01-21 LAB — GLUCOSE, CAPILLARY: Glucose-Capillary: 145 mg/dL — ABNORMAL HIGH (ref 65–99)

## 2016-01-21 NOTE — Progress Notes (Signed)
FMLA forms completed for patient's spouse for intermittent leave to support patient during appointments as well as exacerbations/flare ups. Forms picked up by patient. Copy of forms scanned into patient's electronic medical record.  Ave Filter, RN

## 2016-01-23 ENCOUNTER — Encounter (HOSPITAL_COMMUNITY)
Admission: RE | Admit: 2016-01-23 | Discharge: 2016-01-23 | Disposition: A | Discharge status: Home / Self Care | Payer: Managed Care, Other (non HMO) | Source: Ambulatory Visit | Attending: Internal Medicine | Admitting: Internal Medicine

## 2016-01-23 ENCOUNTER — Encounter (HOSPITAL_COMMUNITY): Payer: Managed Care, Other (non HMO)

## 2016-01-23 DIAGNOSIS — Z955 Presence of coronary angioplasty implant and graft: Secondary | ICD-10-CM | POA: Diagnosis not present

## 2016-01-23 DIAGNOSIS — I214 Non-ST elevation (NSTEMI) myocardial infarction: Secondary | ICD-10-CM

## 2016-01-25 ENCOUNTER — Encounter (HOSPITAL_COMMUNITY): Payer: Managed Care, Other (non HMO)

## 2016-01-25 ENCOUNTER — Encounter (HOSPITAL_COMMUNITY)
Admission: RE | Admit: 2016-01-25 | Discharge: 2016-01-25 | Disposition: A | Discharge status: Home / Self Care | Payer: Managed Care, Other (non HMO) | Source: Ambulatory Visit | Attending: Internal Medicine | Admitting: Internal Medicine

## 2016-01-25 DIAGNOSIS — I214 Non-ST elevation (NSTEMI) myocardial infarction: Secondary | ICD-10-CM

## 2016-01-25 DIAGNOSIS — Z955 Presence of coronary angioplasty implant and graft: Secondary | ICD-10-CM | POA: Diagnosis not present

## 2016-01-28 ENCOUNTER — Encounter (HOSPITAL_COMMUNITY): Payer: Managed Care, Other (non HMO)

## 2016-01-30 ENCOUNTER — Encounter (HOSPITAL_COMMUNITY): Payer: Managed Care, Other (non HMO)

## 2016-02-01 ENCOUNTER — Encounter (HOSPITAL_COMMUNITY): Payer: Managed Care, Other (non HMO)

## 2016-02-04 ENCOUNTER — Encounter (HOSPITAL_COMMUNITY)
Admission: RE | Admit: 2016-02-04 | Discharge: 2016-02-04 | Disposition: A | Discharge status: Home / Self Care | Payer: Managed Care, Other (non HMO) | Source: Ambulatory Visit | Attending: Internal Medicine | Admitting: Internal Medicine

## 2016-02-04 ENCOUNTER — Encounter (HOSPITAL_COMMUNITY): Payer: Commercial Managed Care - HMO

## 2016-02-04 DIAGNOSIS — Z5189 Encounter for other specified aftercare: Secondary | ICD-10-CM | POA: Diagnosis present

## 2016-02-04 DIAGNOSIS — I5022 Chronic systolic (congestive) heart failure: Secondary | ICD-10-CM | POA: Insufficient documentation

## 2016-02-04 DIAGNOSIS — Z955 Presence of coronary angioplasty implant and graft: Secondary | ICD-10-CM | POA: Diagnosis not present

## 2016-02-04 DIAGNOSIS — I214 Non-ST elevation (NSTEMI) myocardial infarction: Secondary | ICD-10-CM

## 2016-02-06 ENCOUNTER — Encounter (HOSPITAL_COMMUNITY): Payer: Commercial Managed Care - HMO

## 2016-02-06 ENCOUNTER — Encounter (HOSPITAL_COMMUNITY): Payer: Managed Care, Other (non HMO)

## 2016-02-08 ENCOUNTER — Encounter (HOSPITAL_COMMUNITY): Payer: Commercial Managed Care - HMO

## 2016-02-08 ENCOUNTER — Encounter (HOSPITAL_COMMUNITY): Payer: Managed Care, Other (non HMO)

## 2016-02-11 ENCOUNTER — Encounter (HOSPITAL_COMMUNITY)
Admission: RE | Admit: 2016-02-11 | Discharge: 2016-02-11 | Disposition: A | Discharge status: Home / Self Care | Payer: Managed Care, Other (non HMO) | Source: Ambulatory Visit | Attending: Internal Medicine | Admitting: Internal Medicine

## 2016-02-11 ENCOUNTER — Encounter (HOSPITAL_COMMUNITY): Payer: Commercial Managed Care - HMO

## 2016-02-11 DIAGNOSIS — Z955 Presence of coronary angioplasty implant and graft: Secondary | ICD-10-CM | POA: Diagnosis not present

## 2016-02-11 DIAGNOSIS — I214 Non-ST elevation (NSTEMI) myocardial infarction: Secondary | ICD-10-CM

## 2016-02-13 ENCOUNTER — Encounter (HOSPITAL_COMMUNITY)
Admission: RE | Admit: 2016-02-13 | Discharge: 2016-02-13 | Disposition: A | Discharge status: Home / Self Care | Payer: Managed Care, Other (non HMO) | Source: Ambulatory Visit | Attending: Internal Medicine | Admitting: Internal Medicine

## 2016-02-13 ENCOUNTER — Encounter (HOSPITAL_COMMUNITY): Payer: Commercial Managed Care - HMO

## 2016-02-13 DIAGNOSIS — I214 Non-ST elevation (NSTEMI) myocardial infarction: Secondary | ICD-10-CM

## 2016-02-13 DIAGNOSIS — Z955 Presence of coronary angioplasty implant and graft: Secondary | ICD-10-CM

## 2016-02-13 NOTE — Progress Notes (Signed)
Cardiac Individual Treatment Plan  Patient Details  Name: Sandra Owens MRN: [id number] Date of Birth: Dec 03, 1965 Referring Provider:   Flowsheet Row CARDIAC REHAB PHASE II EXERCISE from 01/09/2016 in [facility] CARDI[facility]  Referring Provider  Arvilla Meres MD      Initial Encounter Date:  Flowsheet Row CARDIAC REHAB PHASE II EXERCISE from 01/09/2016 in [facility] CARDIAC REHAB  Date  01/09/16  Referring Provider  Arvilla Meres MD      Visit Diagnosis: NSTEMI (non-ST elevated myocardial infarction) [facility])  Stented coronary artery  Patient's Home Medications on Admission:  Current Outpatient Prescriptions:  .  aspirin EC 81 MG tablet, Take 1 tablet (81 mg total) by mouth daily., Disp: 90 tablet, Rfl: 3 .  atorvastatin (LIPITOR) 80 MG tablet, Take 1 tablet (80 mg total) by mouth daily at 6 PM., Disp: 30 tablet, Rfl: 6 .  carvedilol (COREG) 3.125 MG tablet, Take 1 tablet (3.125 mg total) by mouth 2 (two) times daily., Disp: 180 tablet, Rfl: 3 .  citalopram (CELEXA) 10 MG tablet, Take 1 tablet (10 mg total) by mouth daily., Disp: 30 tablet, Rfl: 6 .  famotidine (PEPCID AC) 10 MG chewable tablet, Chew 10 mg by mouth daily as needed for heartburn., Disp: , Rfl:  .  furosemide (LASIX) 20 MG tablet, Take 1 tablet (20 mg total) by mouth daily., Disp: 30 tablet, Rfl: 3 .  glucose blood test strip, 1 each by Other route See admin instructions. Check blood sugar once daily, Disp: , Rfl:  .  isosorbide-hydrALAZINE (BIDIL) 20-37.5 MG tablet, Take 1 tablet by mouth 3 (three) times daily., Disp: 90 tablet, Rfl: 3 .  nicotine (NICODERM CQ - DOSED IN MG/24 HOURS) 14 mg/24hr patch, Place 1 patch (14 mg total) onto the skin daily., Disp: 28 patch, Rfl: 3 .  nitroGLYCERIN (NITROSTAT) 0.4 MG SL tablet, Place 1 tablet (0.4 mg total) under the tongue every 5 (five) minutes as needed for chest pain., Disp: 30 tablet, Rfl: 12 .  sacubitril-valsartan (ENTRESTO)  97-103 MG, Take 1 tablet by mouth 2 (two) times daily., Disp: 60 tablet, Rfl: 3 .  sitaGLIPtin (JANUVIA) 100 MG tablet, Take 100 mg by mouth daily., Disp: , Rfl:  .  spironolactone (ALDACTONE) 25 MG tablet, Take 0.5 tablets (12.5 mg total) by mouth daily., Disp: 30 tablet, Rfl: 3 .  ticagrelor (BRILINTA) 90 MG TABS tablet, Take 1 tablet (90 mg total) by mouth 2 (two) times daily., Disp: 60 tablet, Rfl: 3 .  tobramycin (TOBREX) 0.3 % ophthalmic solution, Place 1-2 drops into the left eye as needed (for eye infection)., Disp: , Rfl:   Past Medical History: Past Medical History:  Diagnosis Date  . Coronary artery disease   . Essential hypertension   . GERD (gastroesophageal reflux disease)   . Hyperlipidemia   . Low back pain   . Systolic CHF (HCC)    a. Found to have new low EF 11/2014 with abnormal nuc.  . Type 2 diabetes mellitus (HCC)     Tobacco Use: History  Smoking Status  . Current Every Day Smoker  . Packs/day: 0.25  . Years: 30.00  . Types: Cigarettes  . Last attempt to quit: 10/12/2015  Smokeless Tobacco  . Never Used    Labs: Recent Review Flowsheet Data    Labs for ITP Cardiac and Pulmonary Rehab Latest Ref Rng & Units 10/15/2015 10/16/2015 10/17/2015 10/18/2015 10/19/2015   Cholestrol 0 - 200 mg/dL - - - - -  LDLCALC 0 - 99 mg/dL - - - - -   HDL >26 mg/dL - - - - -   Trlycerides <150 mg/dL - - - - -   Hemoglobin A1c 4.8 - 5.6 % - - - - -   PHART 7.350 - 7.450 - - - - -   PCO2ART 35.0 - 45.0 mmHg - - - - -   HCO3 20.0 - 24.0 mEq/L 24.0 - - - -   TCO2 0 - 100 mmol/L 26 - - - -   ACIDBASEDEF 0.0 - 2.0 mmol/L 3.0(H) - - - -   O2SAT % 36.0 76.3 64.2 67.7 69.5      Capillary Blood Glucose: Lab Results  Component Value Date   GLUCAP 145 (H) 01/21/2016   GLUCAP 201 (H) 01/18/2016   GLUCAP 168 (H) 01/16/2016   GLUCAP 155 (H) 01/14/2016   GLUCAP 148 (H) 01/11/2016     Exercise Target Goals:    Exercise Program Goal: Individual exercise prescription set with  THRR, safety & activity barriers. Participant demonstrates ability to understand and report RPE using BORG scale, to self-measure pulse accurately, and to acknowledge the importance of the exercise prescription.  Exercise Prescription Goal: Starting with aerobic activity 30 plus minutes a day, 3 days per week for initial exercise prescription. Provide home exercise prescription and guidelines that participant acknowledges understanding prior to discharge.  Activity Barriers & Risk Stratification:     Activity Barriers & Cardiac Risk Stratification - 01/09/16 1740      Activity Barriers & Cardiac Risk Stratification   Activity Barriers Other (comment)   Comments PAD, bilateral claudication   Cardiac Risk Stratification High      6 Minute Walk:     6 Minute Walk    Row Name 01/09/16 1750         6 Minute Walk   Phase Initial     Distance 1113 feet     Walk Time 6 minutes     # of Rest Breaks 0     MPH 2.11     METS 3.62     RPE 13     VO2 Peak 12.67     Symptoms Yes (comment)     Comments bilateral leg pain     Resting HR 81 bpm     Resting BP 110/70     Max Ex. HR 111 bpm     Max Ex. BP 140/78     2 Minute Post BP 130/72        Initial Exercise Prescription:     Initial Exercise Prescription - 01/09/16 1700      Date of Initial Exercise RX and Referring Provider   Date 01/09/16   Referring Provider Arvilla Meres MD     Bike   Level 1.1   Minutes 10   METs 3.68     NuStep   Level 3   Minutes 10   METs 2.5     Track   Laps 9   Minutes 10   METs 2.57     Prescription Details   Frequency (times per week) 3   Duration Progress to 30 minutes of continuous aerobic without signs/symptoms of physical distress     Intensity   THRR 40-80% of Max Heartrate 68-135   Ratings of Perceived Exertion 11-13   Perceived Dyspnea 0-4     Progression   Progression Continue to progress workloads to maintain intensity without signs/symptoms of physical  distress.  Resistance Training   Training Prescription Yes   Weight 1   Reps 10-12      Perform Capillary Blood Glucose checks as needed.  Exercise Prescription Changes:      Exercise Prescription Changes    Row Name 01/14/16 1600 02/11/16 1600           Response to Exercise   Blood Pressure (Admit) 124/70 114/72      Blood Pressure (Exercise) 140/80 144/70      Blood Pressure (Exit) 122/70 102/64      Heart Rate (Admit) 97 bpm 99 bpm      Heart Rate (Exercise) 125 bpm 131 bpm      Heart Rate (Exit) 94 bpm 100 bpm      Rating of Perceived Exertion (Exercise) 15 15      Symptoms -  pt complained of claudication pain in LE -  pt complained of claudication pain in LE      Comments  - HEP reviewed on 01/21/16      Duration Progress to 30 minutes of continuous aerobic without signs/symptoms of physical distress Progress to 30 minutes of continuous aerobic without signs/symptoms of physical distress      Intensity THRR unchanged THRR unchanged        Progression   Progression Continue to progress workloads to maintain intensity without signs/symptoms of physical distress. Continue to progress workloads to maintain intensity without signs/symptoms of physical distress.      Average METs 3 3.1        Resistance Training   Training Prescription Yes Yes      Weight 1 4lbs      Reps 10-12 10-12        Bike   Level 1.1 1.1      Minutes 10 10      METs 3.68 3.68        NuStep   Level 3 4      Minutes 10 10      METs 2.5 3        Track   Laps 9 10      Minutes 10 10      METs 2.57 2.74        Home Exercise Plan   Plans to continue exercise at  - Home  Reviewed HEP on 01/21/16. See progress note      Frequency  - Add 2 additional days to program exercise sessions.         Exercise Comments:      Exercise Comments    Row Name 01/14/16 1656 02/13/16 1642         Exercise Comments Pt is tolerating exercise very well; currently there are no changes to current  Ex. Rx Reviewed METs and goals. Pt is tolerating exercise well other than claudication pain. Will continue to monitor exercise progression.         Discharge Exercise Prescription (Final Exercise Prescription Changes):     Exercise Prescription Changes - 02/11/16 1600      Response to Exercise   Blood Pressure (Admit) 114/72   Blood Pressure (Exercise) 144/70   Blood Pressure (Exit) 102/64   Heart Rate (Admit) 99 bpm   Heart Rate (Exercise) 131 bpm   Heart Rate (Exit) 100 bpm   Rating of Perceived Exertion (Exercise) 15   Symptoms --  pt complained of claudication pain in LE   Comments HEP reviewed on 01/21/16   Duration Progress to 30 minutes of continuous aerobic without signs/symptoms  of physical distress   Intensity THRR unchanged     Progression   Progression Continue to progress workloads to maintain intensity without signs/symptoms of physical distress.   Average METs 3.1     Resistance Training   Training Prescription Yes   Weight 4lbs   Reps 10-12     Bike   Level 1.1   Minutes 10   METs 3.68     NuStep   Level 4   Minutes 10   METs 3     Track   Laps 10   Minutes 10   METs 2.74     Home Exercise Plan   Plans to continue exercise at Home  Reviewed HEP on 01/21/16. See progress note   Frequency Add 2 additional days to program exercise sessions.      Nutrition:  Target Goals: Understanding of nutrition guidelines, daily intake of sodium 1500mg , cholesterol 200mg , calories 30% from fat and 7% or less from saturated fats, daily to have 5 or more servings of fruits and vegetables.  Biometrics:     Pre Biometrics - 01/09/16 1749      Pre Biometrics   Height 5\' 4"  (1.626 m)   Weight 173 lb 1 oz (78.5 kg)   Waist Circumference 39.5 inches   Hip Circumference 44.75 inches   Waist to Hip Ratio 0.88 %   BMI (Calculated) 29.8   Triceps Skinfold 49 mm   % Body Fat 43.5 %   Grip Strength 38.5 kg   Flexibility 11 in   Single Leg Stand 41.69 seconds        Nutrition Therapy Plan and Nutrition Goals:     Nutrition Therapy & Goals - 01/11/16 1400      Nutrition Therapy   Diet Carb Modified, Therapeutic Lifestyle Changes     Personal Nutrition Goals   Personal Goal #1 1-2 lb wt loss/week to a wt loss goal of 6-24 lb at graduation from Cardiac Rehab   Personal Goal #2 Improved glycemic control as evidenced by an A1c trending toward less than 7.0     Intervention Plan   Intervention Prescribe, educate and counsel regarding individualized specific dietary modifications aiming towards targeted core components such as weight, hypertension, lipid management, diabetes, heart failure and other comorbidities.   Expected Outcomes Short Term Goal: Understand basic principles of dietary content, such as calories, fat, sodium, cholesterol and nutrients.;Long Term Goal: Adherence to prescribed nutrition plan.      Nutrition Discharge: Nutrition Scores:     Nutrition Assessments - 01/11/16 1400      MEDFICTS Scores   Pre Score 32      Nutrition Goals Re-Evaluation:   Psychosocial: Target Goals: Acknowledge presence or absence of depression, maximize coping skills, provide positive support system. Participant is able to verbalize types and ability to use techniques and skills needed for reducing stress and depression.  Initial Review & Psychosocial Screening:     Initial Psych Review & Screening - 01/10/16 1530      Family Dynamics   Good Support System? Yes     Barriers   Psychosocial barriers to participate in program There are no identifiable barriers or psychosocial needs.     Screening Interventions   Interventions Encouraged to exercise      Quality of Life Scores:     Quality of Life - 01/11/16 1701      Quality of Life Scores   GLOBAL Pre --  pt is concened about her physical limitations and social  isolation from not being able to work during her recovery. pt is looking forward to returning to work next week.        PHQ-9: Recent Review Flowsheet Data    Depression screen [facility] 2/9 01/11/2016 01/09/2016   Decreased Interest 0 0   Down, Depressed, Hopeless 0 0   PHQ - 2 Score 0 0      Psychosocial Evaluation and Intervention:     Psychosocial Evaluation - 02/13/16 1450      Psychosocial Evaluation & Interventions   Comments pt reports she has increased strength, stamina and energy. pt is proud that she recently enjoyed exercising in hotel fitness center while on vacation.        Psychosocial Re-Evaluation:   Vocational Rehabilitation: Provide vocational rehab assistance to qualifying candidates.   Vocational Rehab Evaluation & Intervention:     Vocational Rehab - 01/10/16 1529      Initial Vocational Rehab Evaluation & Intervention   Assessment shows need for Vocational Rehabilitation No      Education: Education Goals: Education classes will be provided on a weekly basis, covering required topics. Participant will state understanding/return demonstration of topics presented.  Learning Barriers/Preferences:     Learning Barriers/Preferences - 01/09/16 1740      Learning Barriers/Preferences   Learning Barriers None   Learning Preferences Video;Pictoral      Education Topics: Count Your Pulse:  -Group instruction provided by verbal instruction, demonstration, patient participation and written materials to support subject.  Instructors address importance of being able to find your pulse and how to count your pulse when at home without a heart monitor.  Patients get hands on experience counting their pulse with staff help and individually.   Heart Attack, Angina, and Risk Factor Modification:  -Group instruction provided by verbal instruction, video, and written materials to support subject.  Instructors address signs and symptoms of angina and heart attacks.    Also discuss risk factors for heart disease and how to make changes to improve heart health risk  factors.   Functional Fitness:  -Group instruction provided by verbal instruction, demonstration, patient participation, and written materials to support subject.  Instructors address safety measures for doing things around the house.  Discuss how to get up and down off the floor, how to pick things up properly, how to safely get out of a chair without assistance, and balance training.   Meditation and Mindfulness:  -Group instruction provided by verbal instruction, patient participation, and written materials to support subject.  Instructor addresses importance of mindfulness and meditation practice to help reduce stress and improve awareness.  Instructor also leads participants through a meditation exercise.    Stretching for Flexibility and Mobility:  -Group instruction provided by verbal instruction, patient participation, and written materials to support subject.  Instructors lead participants through series of stretches that are designed to increase flexibility thus improving mobility.  These stretches are additional exercise for major muscle groups that are typically performed during regular warm up and cool down.   Hands Only CPR Anytime:  -Group instruction provided by verbal instruction, video, patient participation and written materials to support subject.  Instructors co-teach with AHA video for hands only CPR.  Participants get hands on experience with mannequins.   Nutrition I class: Heart Healthy Eating:  -Group instruction provided by PowerPoint slides, verbal discussion, and written materials to support subject matter. The instructor gives an explanation and review of the Therapeutic Lifestyle Changes diet recommendations, which includes a discussion on lipid  goals, dietary fat, sodium, fiber, plant stanol/sterol esters, sugar, and the components of a well-balanced, healthy diet.   Nutrition II class: Lifestyle Skills:  -Group instruction provided by PowerPoint slides, verbal  discussion, and written materials to support subject matter. The instructor gives an explanation and review of label reading, grocery shopping for heart health, heart healthy recipe modifications, and ways to make healthier choices when eating out.   Diabetes Question & Answer:  -Group instruction provided by PowerPoint slides, verbal discussion, and written materials to support subject matter. The instructor gives an explanation and review of diabetes co-morbidities, pre- and post-prandial blood glucose goals, pre-exercise blood glucose goals, signs, symptoms, and treatment of hypoglycemia and hyperglycemia, and foot care basics. Flowsheet Row CARDIAC REHAB [facility]ASE [facility]  Date  01/11/16  Educator  RD  Instruction Review Code  2- meets goals/outcomes      Diabetes Blitz:  -Group instruction provided by PowerPoint slides, verbal discussion, and written materials to support subject matter. The instructor gives an explanation and review of the physiology behind type 1 and type 2 diabetes, diabetes medications and rational behind using different medications, pre- and post-prandial blood glucose recommendations and Hemoglobin A1c goals, diabetes diet, and exercise including blood glucose guidelines for exercising safely.    Portion Distortion:  -Group instruction provided by PowerPoint slides, verbal discussion, written materials, and food models to support subject matter. The instructor gives an explanation of serving size versus portion size, changes in portions sizes over the last 20 years, and what consists of a serving from each food group.   Stress Management:  -Group instruction provided by verbal instruction, video, and written materials to support subject matter.  Instructors review role of stress in heart disease and how to cope with stress positively.     Exercising on Your Own:  -Group instruction provided by verbal  instruction, power point, and written materials to support subject.  Instructors discuss benefits of exercise, components of exercise, frequency and intensity of exercise, and end points for exercise.  Also discuss use of nitroglycerin and activating EMS.  Review options of places to exercise outside of rehab.  Review guidelines for sex with heart disease.   Cardiac Drugs I:  -Group instruction provided by verbal instruction and written materials to support subject.  Instructor reviews cardiac drug classes: antiplatelets, anticoagulants, beta blockers, and statins.  Instructor discusses reasons, side effects, and lifestyle considerations for each drug class.   Cardiac Drugs II:  -Group instruction provided by verbal instruction and written materials to support subject.  Instructor reviews cardiac drug classes: angiotensin converting enzyme inhibitors (ACE-I), angiotensin II receptor blockers (ARBs), nitrates, and calcium channel blockers.  Instructor discusses reasons, side effects, and lifestyle considerations for each drug class. Flowsheet Row CARDIAC REHAB PHASE [facility]  Date  01/16/16  Educator  Pharm D  Instruction Review Code  2- meets goals/outcomes      Anatomy and Physiology of the Circulatory System:  -Group instruction provided by verbal instruction, video, and written materials to support subject.  Reviews functional anatomy of heart, how it relates to various diagnoses, and what role the heart plays in the overall system. Flowsheet Row CARDIAC REHAB PHASE II [facility]  Date  01/09/16  Instruction Review Code  2- meets goals/outcomes      Knowledge Questionnaire Score:     Knowledge Questionnaire  Score - 01/09/16 1747      Knowledge Questionnaire Score   Pre Score 25/28      Core Components/Risk Factors/Patient Goals at Admission:     Personal  Goals and Risk Factors at Admission - 01/09/16 1801      Core Components/Risk Factors/Patient Goals on Admission    Weight Management Obesity;Yes   Intervention Weight Management: Develop a combined nutrition and exercise program designed to reach desired caloric intake, while maintaining appropriate intake of nutrient and fiber, sodium and fats, and appropriate energy expenditure required for the weight goal.;Weight Management: Provide education and appropriate resources to help participant work on and attain dietary goals.;Weight Management/Obesity: Establish reasonable short term and long term weight goals.;Obesity: Provide education and appropriate resources to help participant work on and attain dietary goals.   Admit Weight 173 lb 1 oz (78.5 kg)   Goal Weight: Short Term 167 lb 11.2 oz (76.1 kg)   Goal Weight: Long Term 162 lb 11.2 oz (73.8 kg)   Expected Outcomes Short Term: Continue to assess and modify interventions until short term weight is achieved;Long Term: Adherence to nutrition and physical activity/exercise program aimed toward attainment of established weight goal;Weight Loss: Understanding of general recommendations for a balanced deficit meal plan, which promotes 1-2 lb weight loss per week and includes a negative energy balance of [phone] kcal/d   Sedentary Yes   Intervention Provide advice, education, support and counseling about physical activity/exercise needs.;Develop an individualized exercise prescription for aerobic and resistive training based on initial evaluation findings, risk stratification, comorbidities and participant's personal goals.   Expected Outcomes Achievement of increased cardiorespiratory fitness and enhanced flexibility, muscular endurance and strength shown through measurements of functional capacity and personal statement of participant.   Increase Strength and Stamina Yes   Intervention Provide advice, education, support and counseling about physical  activity/exercise needs.;Develop an individualized exercise prescription for aerobic and resistive training based on initial evaluation findings, risk stratification, comorbidities and participant's personal goals.   Expected Outcomes Achievement of increased cardiorespiratory fitness and enhanced flexibility, muscular endurance and strength shown through measurements of functional capacity and personal statement of participant.   Tobacco Cessation Yes   Intervention Assist the participant in steps to quit. Provide individualized education and counseling about committing to Tobacco Cessation, relapse prevention, and pharmacological support that can be provided by physician.;Education officer, environmental, assist with locating and accessing local/national Quit Smoking programs, and support quit date choice.   Expected Outcomes Short Term: Will demonstrate readiness to quit, by selecting a quit date.;Long Term: Complete abstinence from all tobacco products for at least 12 months from quit date.;Short Term: Will quit all tobacco product use, adhering to prevention of relapse plan.   Diabetes Yes   Intervention Provide education about signs/symptoms and action to take for hypo/hyperglycemia.;Provide education about proper nutrition, including hydration, and aerobic/resistive exercise prescription along with prescribed medications to achieve blood glucose in normal ranges: Fasting glucose 65-99 mg/dL   Expected Outcomes Short Term: Participant verbalizes understanding of the signs/symptoms and immediate care of hyper/hypoglycemia, proper foot care and importance of medication, aerobic/resistive exercise and nutrition plan for blood glucose control.;Long Term: Attainment of HbA1C < 7%.   Hypertension Yes   Intervention Provide education on lifestyle modifcations including regular physical activity/exercise, weight management, moderate sodium restriction and increased consumption of fresh fruit, vegetables, and low  fat dairy, alcohol moderation, and smoking cessation.;Monitor prescription use compliance.   Expected Outcomes Short Term: Continued assessment and intervention until BP is < 140/82mm HG  in hypertensive participants. < 130/49mm HG in hypertensive participants with diabetes, heart failure or chronic kidney disease.;Long Term: Maintenance of blood pressure at goal levels.   Lipids Yes   Intervention Provide education and support for participant on nutrition & aerobic/resistive exercise along with prescribed medications to achieve LDL 70mg , HDL >40mg .   Expected Outcomes Short Term: Participant states understanding of desired cholesterol values and is compliant with medications prescribed. Participant is following exercise prescription and nutrition guidelines.;Long Term: Cholesterol controlled with medications as prescribed, with individualized exercise RX and with personalized nutrition plan. Value goals: LDL < 70mg , HDL > 40 mg.   Stress Yes   Intervention Offer individual and/or small group education and counseling on adjustment to heart disease, stress management and health-related lifestyle change. Teach and support self-help strategies.   Expected Outcomes Short Term: Participant demonstrates changes in health-related behavior, relaxation and other stress management skills, ability to obtain effective social support, and compliance with psychotropic medications if prescribed.;Long Term: Emotional wellbeing is indicated by absence of clinically significant psychosocial distress or social isolation.   Personal Goal Other Yes   Personal Goal Decreased claudication symptoms. Improve heart healthy lifestyle.   Intervention Provide educaiton regarding walking program and parameters to assist with building collateral circulaiton to help decrease claudication symptoms. Provide education regarding risk factor modification and heart healthy lifestyle.    Expected Outcomes Patient will be more knowledgeable  about heart healthy lifestyle and adopt better habits to improve heart health.      Core Components/Risk Factors/Patient Goals Review:      Goals and Risk Factor Review    Row Name 02/13/16 1643             Core Components/Risk Factors/Patient Goals Review   Personal Goals Review Other;Improve shortness of breath with ADL's       Review Pt is able to perform ADL's and go to work without fatigue or SOB. Pt paces herself with activities and able to complete all tasks on her own.       Expected Outcomes Pt will continue to perform household chores and go to work while experiencing less claudication symptoms with actvities.          Core Components/Risk Factors/Patient Goals at Discharge (Final Review):      Goals and Risk Factor Review - 02/13/16 1643      Core Components/Risk Factors/Patient Goals Review   Personal Goals Review Other;Improve shortness of breath with ADL's   Review Pt is able to perform ADL's and go to work without fatigue or SOB. Pt paces herself with activities and able to complete all tasks on her own.   Expected Outcomes Pt will continue to perform household chores and go to work while experiencing less claudication symptoms with actvities.      ITP Comments:     ITP Comments    Row Name 01/09/16 1801           ITP Comments Medical Director- Dr. Armanda Magic MD          Comments: Pt is making expected progress toward personal goals after completing 9 sessions. Recommend continued exercise and life style modification education including  stress management and relaxation techniques to decrease cardiac risk profile.

## 2016-02-15 ENCOUNTER — Encounter (HOSPITAL_COMMUNITY)
Admission: RE | Admit: 2016-02-15 | Discharge: 2016-02-15 | Disposition: A | Discharge status: Home / Self Care | Payer: Managed Care, Other (non HMO) | Source: Ambulatory Visit | Attending: Internal Medicine | Admitting: Internal Medicine

## 2016-02-15 ENCOUNTER — Encounter (HOSPITAL_COMMUNITY): Payer: Commercial Managed Care - HMO

## 2016-02-15 DIAGNOSIS — Z955 Presence of coronary angioplasty implant and graft: Secondary | ICD-10-CM

## 2016-02-15 DIAGNOSIS — I214 Non-ST elevation (NSTEMI) myocardial infarction: Secondary | ICD-10-CM

## 2016-02-18 ENCOUNTER — Encounter (HOSPITAL_COMMUNITY): Payer: Commercial Managed Care - HMO

## 2016-02-18 ENCOUNTER — Encounter (HOSPITAL_COMMUNITY)
Admission: RE | Admit: 2016-02-18 | Discharge: 2016-02-18 | Disposition: A | Discharge status: Home / Self Care | Payer: Managed Care, Other (non HMO) | Source: Ambulatory Visit | Attending: Internal Medicine | Admitting: Internal Medicine

## 2016-02-18 DIAGNOSIS — Z955 Presence of coronary angioplasty implant and graft: Secondary | ICD-10-CM

## 2016-02-18 DIAGNOSIS — I214 Non-ST elevation (NSTEMI) myocardial infarction: Secondary | ICD-10-CM

## 2016-02-18 NOTE — Progress Notes (Signed)
Sandra Owens 50 y.o. female Nutrition Note Spoke with pt. Nutrition Plan and Nutrition Survey goals reviewed with pt. Pt is following Step 2 of the Therapeutic Lifestyle Changes diet. Pt expressed that she is overwhelmed with the number of lifestyle changes she needs to make (e.g. Tobacco cessation, diet, exercise). Pt is concerned she is eating when she used to smoke. Alternatives to eating and snack ideas discussed. Pt is diabetic. Last A1c indicates blood glucose not optimally controlled. Pt states her A1c was high because she was not taking her DM medication as prescribed since her husband was laid off and her insurance changed. Pt checks CBG's once daily. times a day. Fasting CBG's reportedly 119-233 mg/dL. Pt with dx of CHF. Per discussion, pt is trying to decrease sodium intake by looking at food labels and choosing fresh/frozen fruits and vegetables more often. Pt expressed understanding of the information reviewed. Pt aware of nutrition education classes offered and is unable to attend nutrition classes due to work schedule (pt works 2nd shift).  Lab Results  Component Value Date   HGBA1C 7.8 (H) 10/13/2015   Wt Readings from Last 3 Encounters:  01/10/16 173 lb 12.8 oz (78.8 kg)  01/09/16 173 lb 1 oz (78.5 kg)  12/06/15 180 lb (81.6 kg)    Nutrition Diagnosis ? Food-and nutrition-related knowledge deficit related to lack of exposure to information as related to diagnosis of: ? CVD ? DM  ? Overweight related to excessive energy intake as evidenced by a BMI of 29.8  Nutrition Intervention ? Pt's individual nutrition plan reviewed with pt. ? Benefits of adopting Therapeutic Lifestyle Changes discussed when Medficts reviewed. ? Pt to attend the Portion Distortion class ? Pt to attend the Diabetes Q & A class  ? Pt given handouts for: ? Nutrition I class ? Nutrition II class ? Diabetes Blitz Class  ? Continue client-centered nutrition education by RD, as part of interdisciplinary  care. Goal(s) ? Pt to describe the benefit of including fruits, vegetables, whole grains, and low-fat dairy products in a heart healthy meal plan. ? CBG concentrations in the normal range or as close to normal as is safely possible. Monitor and Evaluate progress toward nutrition goal with team. Mickle Plumb, M.Ed, RD, LDN, CDE 02/18/2016 2:20 PM

## 2016-02-20 ENCOUNTER — Encounter (HOSPITAL_COMMUNITY): Payer: Commercial Managed Care - HMO

## 2016-02-20 ENCOUNTER — Encounter (HOSPITAL_COMMUNITY)
Admission: RE | Admit: 2016-02-20 | Discharge: 2016-02-20 | Disposition: A | Discharge status: Home / Self Care | Payer: Managed Care, Other (non HMO) | Source: Ambulatory Visit | Attending: Internal Medicine | Admitting: Internal Medicine

## 2016-02-20 DIAGNOSIS — Z955 Presence of coronary angioplasty implant and graft: Secondary | ICD-10-CM

## 2016-02-20 DIAGNOSIS — I214 Non-ST elevation (NSTEMI) myocardial infarction: Secondary | ICD-10-CM

## 2016-02-22 ENCOUNTER — Encounter (HOSPITAL_COMMUNITY): Payer: Managed Care, Other (non HMO)

## 2016-02-22 ENCOUNTER — Encounter (HOSPITAL_COMMUNITY): Payer: Self-pay

## 2016-02-22 ENCOUNTER — Encounter (HOSPITAL_COMMUNITY): Payer: Commercial Managed Care - HMO

## 2016-02-22 NOTE — Progress Notes (Signed)
[facility] faxed order for update on [facility] order. Per most recent echo (01/2016) and OV note with Dr. Gala Romney, patient's EF improved to 50%. Order faxed back to [facility] per this assessment with supporting documentation to provided return fax # [phone] Copy of order form scanned into patient's medical record.  Ave Filter, RN

## 2016-02-25 ENCOUNTER — Encounter (HOSPITAL_COMMUNITY)
Admission: RE | Admit: 2016-02-25 | Discharge: 2016-02-25 | Disposition: A | Discharge status: Home / Self Care | Payer: Managed Care, Other (non HMO) | Source: Ambulatory Visit | Attending: Internal Medicine | Admitting: Internal Medicine

## 2016-02-25 ENCOUNTER — Encounter (HOSPITAL_COMMUNITY): Payer: Commercial Managed Care - HMO

## 2016-02-25 DIAGNOSIS — Z955 Presence of coronary angioplasty implant and graft: Secondary | ICD-10-CM

## 2016-02-25 DIAGNOSIS — I214 Non-ST elevation (NSTEMI) myocardial infarction: Secondary | ICD-10-CM

## 2016-02-27 ENCOUNTER — Encounter (HOSPITAL_COMMUNITY): Payer: Commercial Managed Care - HMO

## 2016-02-27 ENCOUNTER — Encounter (HOSPITAL_COMMUNITY): Payer: Managed Care, Other (non HMO)

## 2016-02-29 ENCOUNTER — Encounter (HOSPITAL_COMMUNITY): Payer: Commercial Managed Care - HMO

## 2016-02-29 ENCOUNTER — Encounter (HOSPITAL_COMMUNITY)
Admission: RE | Admit: 2016-02-29 | Discharge: 2016-02-29 | Disposition: A | Discharge status: Home / Self Care | Payer: Managed Care, Other (non HMO) | Source: Ambulatory Visit | Attending: Internal Medicine | Admitting: Internal Medicine

## 2016-02-29 DIAGNOSIS — Z955 Presence of coronary angioplasty implant and graft: Secondary | ICD-10-CM

## 2016-02-29 DIAGNOSIS — I214 Non-ST elevation (NSTEMI) myocardial infarction: Secondary | ICD-10-CM

## 2016-03-03 ENCOUNTER — Encounter (HOSPITAL_COMMUNITY): Payer: Commercial Managed Care - HMO

## 2016-03-03 ENCOUNTER — Encounter (HOSPITAL_COMMUNITY): Payer: Managed Care, Other (non HMO)

## 2016-03-05 ENCOUNTER — Encounter (HOSPITAL_COMMUNITY): Payer: Commercial Managed Care - HMO

## 2016-03-07 ENCOUNTER — Encounter (HOSPITAL_COMMUNITY): Payer: Commercial Managed Care - HMO

## 2016-03-10 ENCOUNTER — Encounter (HOSPITAL_COMMUNITY): Payer: Commercial Managed Care - HMO

## 2016-03-11 ENCOUNTER — Ambulatory Visit (INDEPENDENT_AMBULATORY_CARE_PROVIDER_SITE_OTHER): Payer: Commercial Managed Care - HMO | Admitting: Cardiovascular Disease

## 2016-03-11 VITALS — BP 150/74 | HR 98 | Ht 64.0 in | Wt 168.8 lb

## 2016-03-11 DIAGNOSIS — I2510 Atherosclerotic heart disease of native coronary artery without angina pectoris: Secondary | ICD-10-CM | POA: Diagnosis not present

## 2016-03-11 DIAGNOSIS — I10 Essential (primary) hypertension: Secondary | ICD-10-CM

## 2016-03-11 DIAGNOSIS — I739 Peripheral vascular disease, unspecified: Secondary | ICD-10-CM | POA: Diagnosis not present

## 2016-03-11 DIAGNOSIS — Z720 Tobacco use: Secondary | ICD-10-CM

## 2016-03-11 DIAGNOSIS — I5022 Chronic systolic (congestive) heart failure: Secondary | ICD-10-CM

## 2016-03-11 NOTE — Patient Instructions (Signed)
Medication Instructions: Your physician recommends that you continue on your current medications as directed. Please refer to the Current Medication list given to you today.   Follow-Up: Your physician wants you to follow-up in: 6 months with Dr. Kirke Corin. You will receive a reminder letter in the mail two months in advance. If you don't receive a letter, please call our office to schedule the follow-up appointment.  If you need a refill on your cardiac medications before your next appointment, please call your pharmacy.

## 2016-03-11 NOTE — Progress Notes (Signed)
Cardiology Office Note   Date:  03/11/2016   ID:  Sandra Owens, DOB April 27, 1966, MRN [id number]  PCP:  Allean Found, MD  Cardiologist: Dr. Gala Romney  Chief Complaint  Patient presents with  . PAD      History of Present Illness: Sandra Owens is a 50 y.o. female who is here today for a follow up visit regarding  peripheral arterial disease. She has known history of chronic systolic heart failure and coronary artery disease. Ejection fraction initially in June was 15-20%. She had LAD PCI. Ejection fraction improved to 45% on most recent evaluation.  She was seen 3 months ago for severe bilateral calf claudication. Noninvasive vascular evaluation showed moderately to severely reduced ABI bilaterally with evidence of SFA occlusion bilaterally with reconstitution via collaterals. I advised her to quit smoking and start walking program at cardiac rehabilitation. She cut down on smoking but did not quit completely. She reports significant improvement in overall symptoms including her cardiac symptoms as well as her claudication. Bilateral calf claudication is now mild. She is able to walk 5 laps on the track without having to stop. This is significant improvement from before.   Past Medical History:  Diagnosis Date  . Coronary artery disease   . Essential hypertension   . GERD (gastroesophageal reflux disease)   . Hyperlipidemia   . Low back pain   . Systolic CHF (HCC)    a. Found to have new low EF 11/2014 with abnormal nuc.  . Type 2 diabetes mellitus (HCC)     Past Surgical History:  Procedure Laterality Date  . CARDIAC CATHETERIZATION N/A 11/23/2014   Procedure: Right/Left Heart Cath and Coronary Angiography;  Surgeon: Corky Crafts, MD;  Location: [facility] INVASIVE CV LAB;  Service: Cardiovascular;  Laterality: N/A;  . CARDIAC CATHETERIZATION N/A 10/15/2015   Procedure: Right/Left Heart Cath and Coronary Angiography;  Surgeon: Lyn Records, MD;  Location: [facility]  INVASIVE CV LAB;  Service: Cardiovascular;  Laterality: N/A;  . CARDIAC CATHETERIZATION N/A 10/17/2015   Procedure: Coronary Stent Intervention;  Surgeon: Lyn Records, MD;  Location: [facility] INVASIVE CV LAB;  Service: Cardiovascular;  Laterality: N/A;  . CORONARY ANGIOPLASTY    . TUBAL LIGATION       Current Outpatient Prescriptions  Medication Sig Dispense Refill  . aspirin EC 81 MG tablet Take 1 tablet (81 mg total) by mouth daily. 90 tablet 3  . atorvastatin (LIPITOR) 80 MG tablet Take 1 tablet (80 mg total) by mouth daily at 6 PM. 30 tablet 6  . carvedilol (COREG) 3.125 MG tablet Take 1 tablet (3.125 mg total) by mouth 2 (two) times daily. 180 tablet 3  . citalopram (CELEXA) 10 MG tablet Take 1 tablet (10 mg total) by mouth daily. 30 tablet 6  . famotidine (PEPCID AC) 10 MG chewable tablet Chew 10 mg by mouth daily as needed for heartburn.    Marland Kitchen glucose blood test strip 1 each by Other route See admin instructions. Check blood sugar once daily    . isosorbide-hydrALAZINE (BIDIL) 20-37.5 MG tablet Take 1 tablet by mouth 3 (three) times daily. 90 tablet 3  . nicotine (NICODERM CQ - DOSED IN MG/24 HOURS) 14 mg/24hr patch Place 1 patch (14 mg total) onto the skin daily. 28 patch 3  . nitroGLYCERIN (NITROSTAT) 0.4 MG SL tablet Place 1 tablet (0.4 mg total) under the tongue every 5 (five) minutes as needed for chest pain. 30 tablet 12  . sacubitril-valsartan (ENTRESTO) 97-103 MG Take 1  tablet by mouth 2 (two) times daily. 60 tablet 3  . sitaGLIPtin (JANUVIA) 100 MG tablet Take 100 mg by mouth daily.    Marland Kitchen spironolactone (ALDACTONE) 25 MG tablet Take 0.5 tablets (12.5 mg total) by mouth daily. 30 tablet 3  . ticagrelor (BRILINTA) 90 MG TABS tablet Take 1 tablet (90 mg total) by mouth 2 (two) times daily. 60 tablet 3  . tobramycin (TOBREX) 0.3 % ophthalmic solution Place 1-2 drops into the left eye as needed (for eye infection).     No current facility-administered medications for this visit.      Allergies:   Patient has no known allergies.    Social History:  The patient  reports that she has been smoking Cigarettes.  She has a 7.50 pack-year smoking history. She has never used smokeless tobacco. She reports that she does not drink alcohol or use drugs.   Family History:  The patient's family history includes CAD in her father; Diabetes Mellitus II in her father and mother; Hypertension in her father and mother; Stroke in her paternal grandmother.    ROS:  Please see the history of present illness.   Otherwise, review of systems are positive for none.   All other systems are reviewed and negative.    PHYSICAL EXAM: VS:  BP (!) 150/74   Pulse 98   Ht 5\' 4"  (1.626 m)   Wt 168 lb 12.8 oz (76.6 kg)   BMI 28.97 kg/m  , BMI Body mass index is 28.97 kg/m. GEN: Well nourished, well developed, in no acute distress  HEENT: normal  Neck: no JVD, carotid bruits, or masses Cardiac: RRR; no murmurs, rubs, or gallops,no edema  Respiratory:  clear to auscultation bilaterally, normal work of breathing GI: soft, nontender, nondistended, + BS MS: no deformity or atrophy  Skin: warm and dry, no rash Neuro:  Strength and sensation are intact Psych: euthymic mood, full affect Vascular: Distal pulses are not palpable.   EKG:  EKG is not ordered today.    Recent Labs: 10/12/2015: ALT 13 10/19/2015: Magnesium 2.1 10/31/2015: B Natriuretic Peptide 199.1; Hemoglobin 13.0; Platelets 340 12/21/2015: BUN 16; Creatinine, Ser 1.35; Potassium 3.9; Sodium 139    Lipid Panel    Component Value Date/Time   CHOL 199 10/14/2015 0417   TRIG 122 10/14/2015 0417   HDL 38 (L) 10/14/2015 0417   CHOLHDL 5.2 10/14/2015 0417   VLDL 24 10/14/2015 0417   LDLCALC 137 (H) 10/14/2015 0417      Wt Readings from Last 3 Encounters:  03/11/16 168 lb 12.8 oz (76.6 kg)  01/10/16 173 lb 12.8 oz (78.8 kg)  01/09/16 173 lb 1 oz (78.5 kg)        ASSESSMENT AND PLAN:  1.  Peripheral arterial disease  with severe bilateral leg claudication due to bilateral SFA occlusion: Her symptoms improved significantly with improvement of her cardiac status and with cardiac rehabilitation. Bilateral calf claudication is currently mild and not lifestyle limiting. Thus, I recommend continuing medical therapy.   2. Coronary artery disease involving native coronary arteries without angina:  She is doing well with no anginal symptoms.   3. Chronic systolic heart failure: She appears to be euvolemic and currently on optimal medical therapy.  4. Tobacco use:  She cut down on smoking but did not quit completely. I discussed with her again the importance of complete cessation.  5. Hyperlipidemia: Continue treatment with atorvastatin with a target LDL of less than 70.    Disposition:  FU with me in 6 months  Signed,  Lorine BearsMuhammad Tatiyanna Lashley, MD  03/11/2016 11:39 AM    [facility]

## 2016-03-12 ENCOUNTER — Encounter (HOSPITAL_COMMUNITY)
Admission: RE | Admit: 2016-03-12 | Discharge: 2016-03-12 | Disposition: A | Discharge status: Home / Self Care | Payer: Commercial Managed Care - HMO | Source: Ambulatory Visit | Attending: Internal Medicine | Admitting: Internal Medicine

## 2016-03-12 ENCOUNTER — Encounter (HOSPITAL_COMMUNITY): Payer: Commercial Managed Care - HMO

## 2016-03-12 DIAGNOSIS — Z955 Presence of coronary angioplasty implant and graft: Secondary | ICD-10-CM

## 2016-03-12 DIAGNOSIS — I214 Non-ST elevation (NSTEMI) myocardial infarction: Secondary | ICD-10-CM

## 2016-03-12 DIAGNOSIS — I5022 Chronic systolic (congestive) heart failure: Secondary | ICD-10-CM | POA: Diagnosis present

## 2016-03-12 DIAGNOSIS — Z5189 Encounter for other specified aftercare: Secondary | ICD-10-CM | POA: Insufficient documentation

## 2016-03-12 NOTE — Progress Notes (Signed)
Cardiac Individual Treatment Plan  Patient Details  Name: Sandra Owens MRN: [id number] Date of Birth: 1965/05/22 Referring Provider:   Flowsheet Row CARDIAC REHAB PHASE II EXERCISE from 01/09/2016 in [facility] CARDIAC [facility]  Referring Provider  Arvilla Meres MD      Initial Encounter Date:  Flowsheet Row CARDIAC REHAB PHASE II EXERCISE from 01/09/2016 in [facility] CARDIAC REHAB  Date  01/09/16  Referring Provider  Arvilla Meres MD      Visit Diagnosis: NSTEMI (non-ST elevated myocardial infarction) [facility])  Stented coronary artery  Patient's Home Medications on Admission:  Current Outpatient Prescriptions:  .  aspirin EC 81 MG tablet, Take 1 tablet (81 mg total) by mouth daily., Disp: 90 tablet, Rfl: 3 .  atorvastatin (LIPITOR) 80 MG tablet, Take 1 tablet (80 mg total) by mouth daily at 6 PM., Disp: 30 tablet, Rfl: 6 .  carvedilol (COREG) 3.125 MG tablet, Take 1 tablet (3.125 mg total) by mouth 2 (two) times daily., Disp: 180 tablet, Rfl: 3 .  citalopram (CELEXA) 10 MG tablet, Take 1 tablet (10 mg total) by mouth daily., Disp: 30 tablet, Rfl: 6 .  famotidine (PEPCID AC) 10 MG chewable tablet, Chew 10 mg by mouth daily as needed for heartburn., Disp: , Rfl:  .  glucose blood test strip, 1 each by Other route See admin instructions. Check blood sugar once daily, Disp: , Rfl:  .  isosorbide-hydrALAZINE (BIDIL) 20-37.5 MG tablet, Take 1 tablet by mouth 3 (three) times daily., Disp: 90 tablet, Rfl: 3 .  nicotine (NICODERM CQ - DOSED IN MG/24 HOURS) 14 mg/24hr patch, Place 1 patch (14 mg total) onto the skin daily., Disp: 28 patch, Rfl: 3 .  nitroGLYCERIN (NITROSTAT) 0.4 MG SL tablet, Place 1 tablet (0.4 mg total) under the tongue every 5 (five) minutes as needed for chest pain., Disp: 30 tablet, Rfl: 12 .  sacubitril-valsartan (ENTRESTO) 97-103 MG, Take 1 tablet by mouth 2 (two) times daily., Disp: 60 tablet, Rfl: 3 .  sitaGLIPtin (JANUVIA)  100 MG tablet, Take 100 mg by mouth daily., Disp: , Rfl:  .  spironolactone (ALDACTONE) 25 MG tablet, Take 0.5 tablets (12.5 mg total) by mouth daily., Disp: 30 tablet, Rfl: 3 .  ticagrelor (BRILINTA) 90 MG TABS tablet, Take 1 tablet (90 mg total) by mouth 2 (two) times daily., Disp: 60 tablet, Rfl: 3 .  tobramycin (TOBREX) 0.3 % ophthalmic solution, Place 1-2 drops into the left eye as needed (for eye infection)., Disp: , Rfl:   Past Medical History: Past Medical History:  Diagnosis Date  . Coronary artery disease   . Essential hypertension   . GERD (gastroesophageal reflux disease)   . Hyperlipidemia   . Low back pain   . Systolic CHF (HCC)    a. Found to have new low EF 11/2014 with abnormal nuc.  . Type 2 diabetes mellitus (HCC)     Tobacco Use: History  Smoking Status  . Current Every Day Smoker  . Packs/day: 0.25  . Years: 30.00  . Types: Cigarettes  . Last attempt to quit: 10/12/2015  Smokeless Tobacco  . Never Used    Labs: Recent Review Flowsheet Data    Labs for ITP Cardiac and Pulmonary Rehab Latest Ref Rng & Units 10/15/2015 10/16/2015 10/17/2015 10/18/2015 10/19/2015   Cholestrol 0 - 200 mg/dL - - - - -   LDLCALC 0 - 99 mg/dL - - - - -   HDL >40 mg/dL - - - - -  Trlycerides <150 mg/dL - - - - -   Hemoglobin A1c 4.8 - 5.6 % - - - - -   PHART 7.350 - 7.450 - - - - -   PCO2ART 35.0 - 45.0 mmHg - - - - -   HCO3 20.0 - 24.0 mEq/L 24.0 - - - -   TCO2 0 - 100 mmol/L 26 - - - -   ACIDBASEDEF 0.0 - 2.0 mmol/L 3.0(H) - - - -   O2SAT % 36.0 76.3 64.2 67.7 69.5      Capillary Blood Glucose: Lab Results  Component Value Date   GLUCAP 145 (H) 01/21/2016   GLUCAP 201 (H) 01/18/2016   GLUCAP 168 (H) 01/16/2016   GLUCAP 155 (H) 01/14/2016   GLUCAP 148 (H) 01/11/2016     Exercise Target Goals:    Exercise Program Goal: Individual exercise prescription set with THRR, safety & activity barriers. Participant demonstrates ability to understand and report RPE using BORG  scale, to self-measure pulse accurately, and to acknowledge the importance of the exercise prescription.  Exercise Prescription Goal: Starting with aerobic activity 30 plus minutes a day, 3 days per week for initial exercise prescription. Provide home exercise prescription and guidelines that participant acknowledges understanding prior to discharge.  Activity Barriers & Risk Stratification:     Activity Barriers & Cardiac Risk Stratification - 01/09/16 1740      Activity Barriers & Cardiac Risk Stratification   Activity Barriers Other (comment)   Comments PAD, bilateral claudication   Cardiac Risk Stratification High      6 Minute Walk:     6 Minute Walk    Row Name 01/09/16 1750         6 Minute Walk   Phase Initial     Distance 1113 feet     Walk Time 6 minutes     # of Rest Breaks 0     MPH 2.11     METS 3.62     RPE 13     VO2 Peak 12.67     Symptoms Yes (comment)     Comments bilateral leg pain     Resting HR 81 bpm     Resting BP 110/70     Max Ex. HR 111 bpm     Max Ex. BP 140/78     2 Minute Post BP 130/72        Initial Exercise Prescription:     Initial Exercise Prescription - 01/09/16 1700      Date of Initial Exercise RX and Referring Provider   Date 01/09/16   Referring Provider Arvilla Meres MD     Bike   Level 1.1   Minutes 10   METs 3.68     NuStep   Level 3   Minutes 10   METs 2.5     Track   Laps 9   Minutes 10   METs 2.57     Prescription Details   Frequency (times per week) 3   Duration Progress to 30 minutes of continuous aerobic without signs/symptoms of physical distress     Intensity   THRR 40-80% of Max Heartrate 68-135   Ratings of Perceived Exertion 11-13   Perceived Dyspnea 0-4     Progression   Progression Continue to progress workloads to maintain intensity without signs/symptoms of physical distress.     Resistance Training   Training Prescription Yes   Weight 1   Reps 10-12      Perform  Capillary  Blood Glucose checks as needed.  Exercise Prescription Changes:     Exercise Prescription Changes    Row Name 01/14/16 1600 02/11/16 1600 03/11/16 1100         Response to Exercise   Blood Pressure (Admit) 124/70 114/72 146/70     Blood Pressure (Exercise) 140/80 144/70 140/70     Blood Pressure (Exit) 122/70 102/64 116/70     Heart Rate (Admit) 97 bpm 99 bpm 98 bpm     Heart Rate (Exercise) 125 bpm 131 bpm 132 bpm     Heart Rate (Exit) 94 bpm 100 bpm 87 bpm     Rating of Perceived Exertion (Exercise) 15 15 15      Symptoms -  pt complained of claudication pain in LE -  pt complained of claudication pain in LE -  pt complained of claudication pain in LE     Comments  - HEP reviewed on 01/21/16 HEP reviewed on 01/21/16     Duration Progress to 30 minutes of continuous aerobic without signs/symptoms of physical distress Progress to 30 minutes of continuous aerobic without signs/symptoms of physical distress Progress to 30 minutes of continuous aerobic without signs/symptoms of physical distress     Intensity THRR unchanged THRR unchanged THRR unchanged       Progression   Progression Continue to progress workloads to maintain intensity without signs/symptoms of physical distress. Continue to progress workloads to maintain intensity without signs/symptoms of physical distress. Continue to progress workloads to maintain intensity without signs/symptoms of physical distress.     Average METs 3 3.1 3.2       Resistance Training   Training Prescription Yes Yes Yes     Weight 1 4lbs 3lbs     Reps 10-12 10-12 10-12       Bike   Level 1.1 1.1 1.1     Minutes 10 10 15      METs 3.68 3.68 3.68       NuStep   Level 3 4  -     Minutes 10 10  -     METs 2.5 3  -       Track   Laps 9 10 16      Minutes 10 10 15      METs 2.57 2.74 3.6       Home Exercise Plan   Plans to continue exercise at  - Home  Reviewed HEP on 01/21/16. See progress note Home  Reviewed HEP on 01/21/16. See  progress note     Frequency  - Add 2 additional days to program exercise sessions. Add 2 additional days to program exercise sessions.        Exercise Comments:     Exercise Comments    Row Name 01/14/16 1656 02/13/16 1642 03/11/16 1138       Exercise Comments Pt is tolerating exercise very well; currently there are no changes to current Ex. Rx Reviewed METs and goals. Pt is tolerating exercise well other than claudication pain. Will continue to monitor exercise progression. Reviewed METs and goals. Pt is tolerating exercise well other than claudication pain. Will continue to monitor exercise progression.        Discharge Exercise Prescription (Final Exercise Prescription Changes):     Exercise Prescription Changes - 03/11/16 1100      Response to Exercise   Blood Pressure (Admit) 146/70   Blood Pressure (Exercise) 140/70   Blood Pressure (Exit) 116/70   Heart Rate (Admit) 98 bpm   Heart Rate (  Exercise) 132 bpm   Heart Rate (Exit) 87 bpm   Rating of Perceived Exertion (Exercise) 15   Symptoms --  pt complained of claudication pain in LE   Comments HEP reviewed on 01/21/16   Duration Progress to 30 minutes of continuous aerobic without signs/symptoms of physical distress   Intensity THRR unchanged     Progression   Progression Continue to progress workloads to maintain intensity without signs/symptoms of physical distress.   Average METs 3.2     Resistance Training   Training Prescription Yes   Weight 3lbs   Reps 10-12     Bike   Level 1.1   Minutes 15   METs 3.68     Track   Laps 16   Minutes 15   METs 3.6     Home Exercise Plan   Plans to continue exercise at Home  Reviewed HEP on 01/21/16. See progress note   Frequency Add 2 additional days to program exercise sessions.      Nutrition:  Target Goals: Understanding of nutrition guidelines, daily intake of sodium 1500mg , cholesterol 200mg , calories 30% from fat and 7% or less from saturated fats, daily to  have 5 or more servings of fruits and vegetables.  Biometrics:     Pre Biometrics - 01/09/16 1749      Pre Biometrics   Height 5\' 4"  (1.626 m)   Weight 173 lb 1 oz (78.5 kg)   Waist Circumference 39.5 inches   Hip Circumference 44.75 inches   Waist to Hip Ratio 0.88 %   BMI (Calculated) 29.8   Triceps Skinfold 49 mm   % Body Fat 43.5 %   Grip Strength 38.5 kg   Flexibility 11 in   Single Leg Stand 41.69 seconds       Nutrition Therapy Plan and Nutrition Goals:     Nutrition Therapy & Goals - 01/11/16 1400      Nutrition Therapy   Diet Carb Modified, Therapeutic Lifestyle Changes     Personal Nutrition Goals   Personal Goal #1 1-2 lb wt loss/week to a wt loss goal of 6-24 lb at graduation from Cardiac Rehab   Personal Goal #2 Improved glycemic control as evidenced by an A1c trending toward less than 7.0     Intervention Plan   Intervention Prescribe, educate and counsel regarding individualized specific dietary modifications aiming towards targeted core components such as weight, hypertension, lipid management, diabetes, heart failure and other comorbidities.   Expected Outcomes Short Term Goal: Understand basic principles of dietary content, such as calories, fat, sodium, cholesterol and nutrients.;Long Term Goal: Adherence to prescribed nutrition plan.      Nutrition Discharge: Nutrition Scores:     Nutrition Assessments - 01/11/16 1400      MEDFICTS Scores   Pre Score 32      Nutrition Goals Re-Evaluation:   Psychosocial: Target Goals: Acknowledge presence or absence of depression, maximize coping skills, provide positive support system. Participant is able to verbalize types and ability to use techniques and skills needed for reducing stress and depression.  Initial Review & Psychosocial Screening:     Initial Psych Review & Screening - 01/10/16 1530      Family Dynamics   Good Support System? Yes     Barriers   Psychosocial barriers to participate  in program There are no identifiable barriers or psychosocial needs.     Screening Interventions   Interventions Encouraged to exercise      Quality of Life Scores:  Quality of Life - 01/11/16 1701      Quality of Life Scores   GLOBAL Pre --  pt is concened about her physical limitations and social isolation from not being able to work during her recovery. pt is looking forward to returning to work next week.       PHQ-9: Recent Review Flowsheet Data    Depression screen [facility] 2/9 01/11/2016 01/09/2016   Decreased Interest 0 0   Down, Depressed, Hopeless 0 0   PHQ - 2 Score 0 0      Psychosocial Evaluation and Intervention:     Psychosocial Evaluation - 02/13/16 1450      Psychosocial Evaluation & Interventions   Comments pt reports she has increased strength, stamina and energy. pt is proud that she recently enjoyed exercising in hotel fitness center while on vacation.        Psychosocial Re-Evaluation:     Psychosocial Re-Evaluation    Row Name 03/11/16 1014             Psychosocial Re-Evaluation   Interventions Encouraged to attend Cardiac Rehabilitation for the exercise       Comments no psychosocial needs identified, no interventions necessary.       Continued Psychosocial Services Needed No          Vocational Rehabilitation: Provide vocational rehab assistance to qualifying candidates.   Vocational Rehab Evaluation & Intervention:     Vocational Rehab - 01/10/16 1529      Initial Vocational Rehab Evaluation & Intervention   Assessment shows need for Vocational Rehabilitation No      Education: Education Goals: Education classes will be provided on a weekly basis, covering required topics. Participant will state understanding/return demonstration of topics presented.  Learning Barriers/Preferences:     Learning Barriers/Preferences - 01/09/16 1740      Learning Barriers/Preferences   Learning Barriers None   Learning Preferences  Video;Pictoral      Education Topics: Count Your Pulse:  -Group instruction provided by verbal instruction, demonstration, patient participation and written materials to support subject.  Instructors address importance of being able to find your pulse and how to count your pulse when at home without a heart monitor.  Patients get hands on experience counting their pulse with staff help and individually.   Heart Attack, Angina, and Risk Factor Modification:  -Group instruction provided by verbal instruction, video, and written materials to support subject.  Instructors address signs and symptoms of angina and heart attacks.    Also discuss risk factors for heart disease and how to make changes to improve heart health risk factors.   Functional Fitness:  -Group instruction provided by verbal instruction, demonstration, patient participation, and written materials to support subject.  Instructors address safety measures for doing things around the house.  Discuss how to get up and down off the floor, how to pick things up properly, how to safely get out of a chair without assistance, and balance training.   Meditation and Mindfulness:  -Group instruction provided by verbal instruction, patient participation, and written materials to support subject.  Instructor addresses importance of mindfulness and meditation practice to help reduce stress and improve awareness.  Instructor also leads participants through a meditation exercise.    Stretching for Flexibility and Mobility:  -Group instruction provided by verbal instruction, patient participation, and written materials to support subject.  Instructors lead participants through series of stretches that are designed to increase flexibility thus improving mobility.  These stretches are additional exercise  for major muscle groups that are typically performed during regular warm up and cool down.   Hands Only CPR Anytime:  -Group instruction  provided by verbal instruction, video, patient participation and written materials to support subject.  Instructors co-teach with AHA video for hands only CPR.  Participants get hands on experience with mannequins.   Nutrition I class: Heart Healthy Eating:  -Group instruction provided by PowerPoint slides, verbal discussion, and written materials to support subject matter. The instructor gives an explanation and review of the Therapeutic Lifestyle Changes diet recommendations, which includes a discussion on lipid goals, dietary fat, sodium, fiber, plant stanol/sterol esters, sugar, and the components of a well-balanced, healthy diet. Flowsheet Row CARDIAC REHAB PHASE II EXERCISE from 02/18/2016 in [facility] CARDIAC REHAB  Date  02/18/16  Educator  RD  Instruction Review Code  Not applicable [Class handout given]      Nutrition II class: Lifestyle Skills:  -Group instruction provided by PowerPoint slides, verbal discussion, and written materials to support subject matter. The instructor gives an explanation and review of label reading, grocery shopping for heart health, heart healthy recipe modifications, and ways to make healthier choices when eating out. Flowsheet Row CARDIAC REHAB PHASE II EXERCISE from 02/18/2016 in [facility] CARDIAC REHAB  Date  02/18/16  Educator  RD  Instruction Review Code  Not applicable [Class handout given]      Diabetes Question & Answer:  -Group instruction provided by PowerPoint slides, verbal discussion, and written materials to support subject matter. The instructor gives an explanation and review of diabetes co-morbidities, pre- and post-prandial blood glucose goals, pre-exercise blood glucose goals, signs, symptoms, and treatment of hypoglycemia and hyperglycemia, and foot care basics. Flowsheet Row CARDIAC REHAB PHASE II EXERCISE from 02/18/2016 in [facility] CARDIAC REHAB  Date  01/11/16  Educator   RD  Instruction Review Code  2- meets goals/outcomes      Diabetes Blitz:  -Group instruction provided by PowerPoint slides, verbal discussion, and written materials to support subject matter. The instructor gives an explanation and review of the physiology behind type 1 and type 2 diabetes, diabetes medications and rational behind using different medications, pre- and post-prandial blood glucose recommendations and Hemoglobin A1c goals, diabetes diet, and exercise including blood glucose guidelines for exercising safely.  Flowsheet Row CARDIAC REHAB PHASE II EXERCISE from 02/18/2016 in [facility] CARDIAC REHAB  Date  02/18/16  Educator  RD  Instruction Review Code  Not applicable [Class handout given]      Portion Distortion:  -Group instruction provided by PowerPoint slides, verbal discussion, written materials, and food models to support subject matter. The instructor gives an explanation of serving size versus portion size, changes in portions sizes over the last 20 years, and what consists of a serving from each food group.   Stress Management:  -Group instruction provided by verbal instruction, video, and written materials to support subject matter.  Instructors review role of stress in heart disease and how to cope with stress positively.     Exercising on Your Own:  -Group instruction provided by verbal instruction, power point, and written materials to support subject.  Instructors discuss benefits of exercise, components of exercise, frequency and intensity of exercise, and end points for exercise.  Also discuss use of nitroglycerin and activating EMS.  Review options of places to exercise outside of rehab.  Review guidelines for sex with heart disease.   Cardiac Drugs I:  -Group instruction  provided by verbal instruction and written materials to support subject.  Instructor reviews cardiac drug classes: antiplatelets, anticoagulants, beta blockers, and statins.   Instructor discusses reasons, side effects, and lifestyle considerations for each drug class.   Cardiac Drugs II:  -Group instruction provided by verbal instruction and written materials to support subject.  Instructor reviews cardiac drug classes: angiotensin converting enzyme inhibitors (ACE-I), angiotensin II receptor blockers (ARBs), nitrates, and calcium channel blockers.  Instructor discusses reasons, side effects, and lifestyle considerations for each drug class. Flowsheet Row CARDIAC REHAB PHASE [facility]  Date  01/16/16  Educator  Pharm D  Instruction Review Code  2- meets goals/outcomes      Anatomy and Physiology of the Circulatory System:  -Group instruction provided by verbal instruction, video, and written materials to support subject.  Reviews functional anatomy of heart, how it relates to various diagnoses, and what role the heart plays in the overall system. Flowsheet Row CARDIAC REHAB PHASE [facility]  Date  01/09/16  Instruction Review Code  2- meets goals/outcomes      Knowledge Questionnaire Score:     Knowledge Questionnaire Score - 01/09/16 1747      Knowledge Questionnaire Score   Pre Score 25/28      Core Components/Risk Factors/Patient Goals at Admission:     Personal Goals and Risk Factors at Admission - 01/09/16 1801      Core Components/Risk Factors/Patient Goals on Admission    Weight Management Obesity;Yes   Intervention Weight Management: Develop a combined nutrition and exercise program designed to reach desired caloric intake, while maintaining appropriate intake of nutrient and fiber, sodium and fats, and appropriate energy expenditure required for the weight goal.;Weight Management: Provide education and appropriate resources to help participant work on and attain dietary goals.;Weight Management/Obesity: Establish  reasonable short term and long term weight goals.;Obesity: Provide education and appropriate resources to help participant work on and attain dietary goals.   Admit Weight 173 lb 1 oz (78.5 kg)   Goal Weight: Short Term 167 lb 11.2 oz (76.1 kg)   Goal Weight: Long Term 162 lb 11.2 oz (73.8 kg)   Expected Outcomes Short Term: Continue to assess and modify interventions until short term weight is achieved;Long Term: Adherence to nutrition and physical activity/exercise program aimed toward attainment of established weight goal;Weight Loss: Understanding of general recommendations for a balanced deficit meal plan, which promotes 1-2 lb weight loss per week and includes a negative energy balance of [phone] kcal/d   Sedentary Yes   Intervention Provide advice, education, support and counseling about physical activity/exercise needs.;Develop an individualized exercise prescription for aerobic and resistive training based on initial evaluation findings, risk stratification, comorbidities and participant's personal goals.   Expected Outcomes Achievement of increased cardiorespiratory fitness and enhanced flexibility, muscular endurance and strength shown through measurements of functional capacity and personal statement of participant.   Increase Strength and Stamina Yes   Intervention Provide advice, education, support and counseling about physical activity/exercise needs.;Develop an individualized exercise prescription for aerobic and resistive training based on initial evaluation findings, risk stratification, comorbidities and participant's personal goals.   Expected Outcomes Achievement of increased cardiorespiratory fitness and enhanced flexibility, muscular endurance and strength shown through measurements of functional capacity and personal statement of participant.   Tobacco Cessation Yes   Intervention Assist the participant in steps to quit. Provide individualized education and counseling about  committing to  Tobacco Cessation, relapse prevention, and pharmacological support that can be provided by physician.;Education officer, environmental, assist with locating and accessing local/national Quit Smoking programs, and support quit date choice.   Expected Outcomes Short Term: Will demonstrate readiness to quit, by selecting a quit date.;Long Term: Complete abstinence from all tobacco products for at least 12 months from quit date.;Short Term: Will quit all tobacco product use, adhering to prevention of relapse plan.   Diabetes Yes   Intervention Provide education about signs/symptoms and action to take for hypo/hyperglycemia.;Provide education about proper nutrition, including hydration, and aerobic/resistive exercise prescription along with prescribed medications to achieve blood glucose in normal ranges: Fasting glucose 65-99 mg/dL   Expected Outcomes Short Term: Participant verbalizes understanding of the signs/symptoms and immediate care of hyper/hypoglycemia, proper foot care and importance of medication, aerobic/resistive exercise and nutrition plan for blood glucose control.;Long Term: Attainment of HbA1C < 7%.   Hypertension Yes   Intervention Provide education on lifestyle modifcations including regular physical activity/exercise, weight management, moderate sodium restriction and increased consumption of fresh fruit, vegetables, and low fat dairy, alcohol moderation, and smoking cessation.;Monitor prescription use compliance.   Expected Outcomes Short Term: Continued assessment and intervention until BP is < 140/61mm HG in hypertensive participants. < 130/56mm HG in hypertensive participants with diabetes, heart failure or chronic kidney disease.;Long Term: Maintenance of blood pressure at goal levels.   Lipids Yes   Intervention Provide education and support for participant on nutrition & aerobic/resistive exercise along with prescribed medications to achieve LDL 70mg , HDL >40mg .    Expected Outcomes Short Term: Participant states understanding of desired cholesterol values and is compliant with medications prescribed. Participant is following exercise prescription and nutrition guidelines.;Long Term: Cholesterol controlled with medications as prescribed, with individualized exercise RX and with personalized nutrition plan. Value goals: LDL < 70mg , HDL > 40 mg.   Stress Yes   Intervention Offer individual and/or small group education and counseling on adjustment to heart disease, stress management and health-related lifestyle change. Teach and support self-help strategies.   Expected Outcomes Short Term: Participant demonstrates changes in health-related behavior, relaxation and other stress management skills, ability to obtain effective social support, and compliance with psychotropic medications if prescribed.;Long Term: Emotional wellbeing is indicated by absence of clinically significant psychosocial distress or social isolation.   Personal Goal Other Yes   Personal Goal Decreased claudication symptoms. Improve heart healthy lifestyle.   Intervention Provide educaiton regarding walking program and parameters to assist with building collateral circulaiton to help decrease claudication symptoms. Provide education regarding risk factor modification and heart healthy lifestyle.    Expected Outcomes Patient will be more knowledgeable about heart healthy lifestyle and adopt better habits to improve heart health.      Core Components/Risk Factors/Patient Goals Review:      Goals and Risk Factor Review    Row Name 02/13/16 1643 03/11/16 1139           Core Components/Risk Factors/Patient Goals Review   Personal Goals Review Other;Improve shortness of breath with ADL's Other;Increase Strength and Stamina      Review Pt is able to perform ADL's and go to work without fatigue or SOB. Pt paces herself with activities and able to complete all tasks on her own. Claudication symptoms  are decreasing and able to keep up and play basketball with grandkids      Expected Outcomes Pt will continue to perform household chores and go to work while experiencing less claudication symptoms with actvities. Pt will continue to  have less claudication pain/symptoms and be able to keep up with grandkids         Core Components/Risk Factors/Patient Goals at Discharge (Final Review):      Goals and Risk Factor Review - 03/11/16 1139      Core Components/Risk Factors/Patient Goals Review   Personal Goals Review Other;Increase Strength and Stamina   Review Claudication symptoms are decreasing and able to keep up and play basketball with grandkids   Expected Outcomes Pt will continue to have less claudication pain/symptoms and be able to keep up with grandkids      ITP Comments:     ITP Comments    Row Name 01/09/16 1801           ITP Comments Medical Director- Dr. Armanda Magic MD          Comments: Pt is making expected progress toward personal goals after completing 16 sessions. Pt recent absences have been due to illness and death in her family.  Pt has new Brewing technologist. Pt will contact her insurer to verify her cardiac rehab benefits.   Recommend continued exercise and life style modification education including  stress management and relaxation techniques to decrease cardiac risk profile.

## 2016-03-14 ENCOUNTER — Encounter (HOSPITAL_COMMUNITY)
Admission: RE | Admit: 2016-03-14 | Discharge: 2016-03-14 | Disposition: A | Discharge status: Home / Self Care | Payer: Commercial Managed Care - HMO | Source: Ambulatory Visit | Attending: Internal Medicine | Admitting: Internal Medicine

## 2016-03-14 ENCOUNTER — Encounter (HOSPITAL_COMMUNITY): Payer: Commercial Managed Care - HMO

## 2016-03-14 DIAGNOSIS — Z955 Presence of coronary angioplasty implant and graft: Secondary | ICD-10-CM

## 2016-03-14 DIAGNOSIS — I214 Non-ST elevation (NSTEMI) myocardial infarction: Secondary | ICD-10-CM

## 2016-03-17 ENCOUNTER — Encounter (HOSPITAL_COMMUNITY): Payer: Commercial Managed Care - HMO

## 2016-03-17 ENCOUNTER — Encounter (HOSPITAL_COMMUNITY)
Admission: RE | Admit: 2016-03-17 | Discharge: 2016-03-17 | Disposition: A | Discharge status: Home / Self Care | Payer: Commercial Managed Care - HMO | Source: Ambulatory Visit | Attending: Internal Medicine | Admitting: Internal Medicine

## 2016-03-17 DIAGNOSIS — Z955 Presence of coronary angioplasty implant and graft: Secondary | ICD-10-CM

## 2016-03-17 DIAGNOSIS — I214 Non-ST elevation (NSTEMI) myocardial infarction: Secondary | ICD-10-CM

## 2016-03-19 ENCOUNTER — Encounter (HOSPITAL_COMMUNITY)
Admission: RE | Admit: 2016-03-19 | Payer: Commercial Managed Care - HMO | Source: Ambulatory Visit | Attending: Internal Medicine | Admitting: Internal Medicine

## 2016-03-19 ENCOUNTER — Encounter (HOSPITAL_COMMUNITY): Payer: Commercial Managed Care - HMO

## 2016-03-21 ENCOUNTER — Encounter (HOSPITAL_COMMUNITY): Payer: Commercial Managed Care - HMO

## 2016-03-21 ENCOUNTER — Encounter (HOSPITAL_COMMUNITY)
Admission: RE | Admit: 2016-03-21 | Discharge: 2016-03-21 | Disposition: A | Discharge status: Home / Self Care | Payer: Commercial Managed Care - HMO | Source: Ambulatory Visit | Attending: Internal Medicine | Admitting: Internal Medicine

## 2016-03-21 DIAGNOSIS — I214 Non-ST elevation (NSTEMI) myocardial infarction: Secondary | ICD-10-CM

## 2016-03-21 DIAGNOSIS — Z955 Presence of coronary angioplasty implant and graft: Secondary | ICD-10-CM | POA: Diagnosis not present

## 2016-03-24 ENCOUNTER — Encounter (HOSPITAL_COMMUNITY)
Admission: RE | Admit: 2016-03-24 | Discharge: 2016-03-24 | Disposition: A | Discharge status: Home / Self Care | Payer: Commercial Managed Care - HMO | Source: Ambulatory Visit | Attending: Internal Medicine | Admitting: Internal Medicine

## 2016-03-24 ENCOUNTER — Other Ambulatory Visit (HOSPITAL_COMMUNITY): Payer: Self-pay | Admitting: *Deleted

## 2016-03-24 ENCOUNTER — Encounter (HOSPITAL_COMMUNITY): Payer: Commercial Managed Care - HMO

## 2016-03-24 DIAGNOSIS — Z955 Presence of coronary angioplasty implant and graft: Secondary | ICD-10-CM

## 2016-03-24 DIAGNOSIS — I214 Non-ST elevation (NSTEMI) myocardial infarction: Secondary | ICD-10-CM

## 2016-03-24 MED ORDER — TICAGRELOR 90 MG PO TABS
90.0000 mg | ORAL_TABLET | Freq: Two times a day (BID) | ORAL | 3 refills | Status: DC
Start: 2016-03-24 — End: 2016-11-12

## 2016-03-26 ENCOUNTER — Encounter (HOSPITAL_COMMUNITY): Payer: Commercial Managed Care - HMO

## 2016-03-31 ENCOUNTER — Encounter (HOSPITAL_COMMUNITY): Payer: Commercial Managed Care - HMO

## 2016-03-31 ENCOUNTER — Encounter (HOSPITAL_COMMUNITY)
Admission: RE | Admit: 2016-03-31 | Discharge: 2016-03-31 | Disposition: A | Discharge status: Home / Self Care | Payer: Commercial Managed Care - HMO | Source: Ambulatory Visit | Attending: Internal Medicine | Admitting: Internal Medicine

## 2016-03-31 DIAGNOSIS — Z955 Presence of coronary angioplasty implant and graft: Secondary | ICD-10-CM

## 2016-03-31 DIAGNOSIS — I214 Non-ST elevation (NSTEMI) myocardial infarction: Secondary | ICD-10-CM

## 2016-04-02 ENCOUNTER — Encounter (HOSPITAL_COMMUNITY): Payer: Self-pay

## 2016-04-02 ENCOUNTER — Encounter (HOSPITAL_COMMUNITY)
Admission: RE | Admit: 2016-04-02 | Discharge: 2016-04-02 | Disposition: A | Discharge status: Home / Self Care | Payer: Commercial Managed Care - HMO | Source: Ambulatory Visit | Attending: Internal Medicine | Admitting: Internal Medicine

## 2016-04-02 ENCOUNTER — Encounter (HOSPITAL_COMMUNITY): Payer: Commercial Managed Care - HMO

## 2016-04-02 VITALS — Ht 64.0 in | Wt 167.3 lb

## 2016-04-02 DIAGNOSIS — I214 Non-ST elevation (NSTEMI) myocardial infarction: Secondary | ICD-10-CM

## 2016-04-02 DIAGNOSIS — Z955 Presence of coronary angioplasty implant and graft: Secondary | ICD-10-CM

## 2016-04-02 NOTE — Progress Notes (Signed)
Cardiac Individual Treatment Plan  Patient Details  Name: Sandra Owens MRN: [id number] Date of Birth: [birth year] Referring Provider:   Flowsheet Row CARDIAC REHAB PHASE II EXERCISE from 01/09/2016 in [place]  Referring Provider  Glori Bickers MD      Initial Encounter Date:  [place] PHASE II EXERCISE from 01/09/2016 in [place]  Date  01/09/16  Referring Provider  Glori Bickers MD      Visit Diagnosis: NSTEMI (non-ST elevated myocardial infarction) [facility])  Stented coronary artery  Patient's Home Medications on Admission:  Current Outpatient Prescriptions:  .  aspirin EC 81 MG tablet, Take 1 tablet (81 mg total) by mouth daily., Disp: 90 tablet, Rfl: 3 .  atorvastatin (LIPITOR) 80 MG tablet, Take 1 tablet (80 mg total) by mouth daily at 6 PM., Disp: 30 tablet, Rfl: 6 .  citalopram (CELEXA) 10 MG tablet, Take 1 tablet (10 mg total) by mouth daily., Disp: 30 tablet, Rfl: 6 .  famotidine (PEPCID AC) 10 MG chewable tablet, Chew 10 mg by mouth daily as needed for heartburn., Disp: , Rfl:  .  glucose blood test strip, 1 each by Other route See admin instructions. Check blood sugar once daily, Disp: , Rfl:  .  nicotine (NICODERM CQ - DOSED IN MG/24 HOURS) 14 mg/24hr patch, Place 1 patch (14 mg total) onto the skin daily., Disp: 28 patch, Rfl: 3 .  nitroGLYCERIN (NITROSTAT) 0.4 MG SL tablet, Place 1 tablet (0.4 mg total) under the tongue every 5 (five) minutes as needed for chest pain., Disp: 30 tablet, Rfl: 12 .  sacubitril-valsartan (ENTRESTO) 97-103 MG, Take 1 tablet by mouth 2 (two) times daily., Disp: 60 tablet, Rfl: 3 .  sitaGLIPtin (JANUVIA) 100 MG tablet, Take 100 mg by mouth daily., Disp: , Rfl:  .  spironolactone (ALDACTONE) 25 MG tablet, Take 0.5 tablets (12.5 mg total) by mouth daily., Disp: 30 tablet, Rfl: 3 .  ticagrelor (BRILINTA) 90 MG TABS tablet, Take 1 tablet (90 mg total) by  mouth 2 (two) times daily., Disp: 60 tablet, Rfl: 3 .  tobramycin (TOBREX) 0.3 % ophthalmic solution, Place 1-2 drops into the left eye as needed (for eye infection)., Disp: , Rfl:  .  carvedilol (COREG) 3.125 MG tablet, Take 1 tablet (3.125 mg total) by mouth 2 (two) times daily., Disp: 60 tablet, Rfl: 6 .  isosorbide-hydrALAZINE (BIDIL) 20-37.5 MG tablet, Take 1 tablet by mouth 3 (three) times daily., Disp: 90 tablet, Rfl: 6  Past Medical History: Past Medical History:  Diagnosis Date  . Coronary artery disease   . Essential hypertension   . GERD (gastroesophageal reflux disease)   . Hyperlipidemia   . Low back pain   . Systolic CHF ([place])    a. Found to have new low EF 11/2014 with abnormal nuc.  . Type 2 diabetes mellitus ([place])     Tobacco Use: History  Smoking Status  . Current Every Day Smoker  . Packs/day: 0.25  . Years: 30.00  . Types: Cigarettes  . Last attempt to quit: 10/12/2015  Smokeless Tobacco  . Never Used    Labs: Recent Review Flowsheet Data    Labs for ITP Cardiac and Pulmonary Rehab Latest Ref Rng & Units 10/15/2015 10/16/2015 10/17/2015 10/18/2015 10/19/2015   Cholestrol 0 - 200 mg/dL - - - - -   LDLCALC 0 - 99 mg/dL - - - - -   HDL >40 mg/dL - - - - -  Trlycerides <150 mg/dL - - - - -   Hemoglobin A1c 4.8 - 5.6 % - - - - -   PHART 7.350 - 7.450 - - - - -   PCO2ART 35.0 - 45.0 mmHg - - - - -   HCO3 20.0 - 24.0 mEq/L 24.0 - - - -   TCO2 0 - 100 mmol/L 26 - - - -   ACIDBASEDEF 0.0 - 2.0 mmol/L 3.0(H) - - - -   O2SAT % 36.0 76.3 64.2 67.7 69.5      Capillary Blood Glucose: Lab Results  Component Value Date   GLUCAP 145 (H) 01/21/2016   GLUCAP 201 (H) 01/18/2016   GLUCAP 168 (H) 01/16/2016   GLUCAP 155 (H) 01/14/2016   GLUCAP 148 (H) 01/11/2016     Exercise Target Goals:    Exercise Program Goal: Individual exercise prescription set with THRR, safety & activity barriers. Participant demonstrates ability to understand and report RPE using BORG  scale, to self-measure pulse accurately, and to acknowledge the importance of the exercise prescription.  Exercise Prescription Goal: Starting with aerobic activity 30 plus minutes a day, 3 days per week for initial exercise prescription. Provide home exercise prescription and guidelines that participant acknowledges understanding prior to discharge.  Activity Barriers & Risk Stratification:     Activity Barriers & Cardiac Risk Stratification - 01/09/16 1740      Activity Barriers & Cardiac Risk Stratification   Activity Barriers Other (comment)   Comments PAD, bilateral claudication   Cardiac Risk Stratification High      6 Minute Walk:     6 Minute Walk    Row Name 01/09/16 1750 04/02/16 1448       6 Minute Walk   Phase Initial Discharge    Distance 1113 feet 1305 feet    Distance % Change  - 17.25 %    Walk Time 6 minutes 6 minutes    # of Rest Breaks 0 0    MPH 2.11 2.47    METS 3.62 4.3    RPE 13 13    VO2 Peak 12.67 15.2    Symptoms Yes (comment)  -    Comments bilateral leg pain 8/10 B LE pain    Resting HR 81 bpm 102 bpm    Resting BP 110/70 114/62    Max Ex. HR 111 bpm 128 bpm    Max Ex. BP 140/78 158/80    2 Minute Post BP 130/72 132/86       Initial Exercise Prescription:     Initial Exercise Prescription - 01/09/16 1700      Date of Initial Exercise RX and Referring Provider   Date 01/09/16   Referring Provider Glori Bickers MD     Bike   Level 1.1   Minutes 10   METs 3.68     NuStep   Level 3   Minutes 10   METs 2.5     Track   Laps 9   Minutes 10   METs 2.57     Prescription Details   Frequency (times per week) 3   Duration Progress to 30 minutes of continuous aerobic without signs/symptoms of physical distress     Intensity   THRR 40-80% of Max Heartrate 68-135   Ratings of Perceived Exertion 11-13   Perceived Dyspnea 0-4     Progression   Progression Continue to progress workloads to maintain intensity without  signs/symptoms of physical distress.     Resistance Training  Training Prescription Yes   Weight 1   Reps 10-12      Perform Capillary Blood Glucose checks as needed.  Exercise Prescription Changes:      Exercise Prescription Changes    Row Name 01/14/16 1600 02/11/16 1600 03/11/16 1100 04/02/16 1500       Exercise Review   Progression  -  -  - Yes      Response to Exercise   Blood Pressure (Admit) 124/70 114/72 146/70 136/70    Blood Pressure (Exercise) 140/80 144/70 140/70 172/86    Blood Pressure (Exit) 122/70 102/64 116/70 130/80    Heart Rate (Admit) 97 bpm 99 bpm 98 bpm 84 bpm    Heart Rate (Exercise) 125 bpm 131 bpm 132 bpm 122 bpm    Heart Rate (Exit) 94 bpm 100 bpm 87 bpm 93 bpm    Rating of Perceived Exertion (Exercise) _0 Symptoms -  pt complained of claudication pain in LE -  pt complained of claudication pain in LE -  pt complained of claudication pain in LE -  pt complained of claudication pain in LE    Comments  - HEP reviewed on 01/21/16 HEP reviewed on 01/21/16 HEP reviewed on 01/21/16    Duration Progress to 30 minutes of continuous aerobic without signs/symptoms of physical distress Progress to 30 minutes of continuous aerobic without signs/symptoms of physical distress Progress to 30 minutes of continuous aerobic without signs/symptoms of physical distress Progress to 30 minutes of continuous aerobic without signs/symptoms of physical distress    Intensity THRR unchanged THRR unchanged THRR unchanged THRR unchanged      Progression   Progression Continue to progress workloads to maintain intensity without signs/symptoms of physical distress. Continue to progress workloads to maintain intensity without signs/symptoms of physical distress. Continue to progress workloads to maintain intensity without signs/symptoms of physical distress. Continue to progress workloads to maintain intensity without signs/symptoms of physical distress.    Average METs  3 3.1 3.2 2.6      Resistance Training   Training Prescription Yes Yes Yes Yes    Weight 1 4lbs 3lbs 4lbs    Reps 10-12 10-12 10-12 10-12      Bike   Level 1.1 1.1 1.1 1.1    Minutes _1 METs 3.68 3.68 3.68 3.7      NuStep   Level 3 4  - 4    Minutes 10 10  - 15    METs 2.5 3  - 2.4      Track   Laps _2 -    Minutes _3 -    METs 2.57 2.74 3.6  -      Home Exercise Plan   Plans to continue exercise at  - Home  Reviewed HEP on 01/21/16. See progress note Home  Reviewed HEP on 01/21/16. See progress note Home  Reviewed HEP on 01/21/16. See progress note    Frequency  - Add 2 additional days to program exercise sessions. Add 2 additional days to program exercise sessions. Add 2 additional days to program exercise sessions.       Exercise Comments:      Exercise Comments    Row Name 01/14/16 1656 02/13/16 1642 03/11/16 1138 04/10/16 1451     Exercise Comments Pt is tolerating exercise very well; currently there are no changes to current Ex. Rx Reviewed METs and goals. Pt is tolerating  exercise well other than claudication pain. Will continue to monitor exercise progression. Reviewed METs and goals. Pt is tolerating exercise well other than claudication pain. Will continue to monitor exercise progression. Pt completed 23 sessions of cardiac rehab and plans to continue exercise at [facility] maintenance       Discharge Exercise Prescription (Final Exercise Prescription Changes):     Exercise Prescription Changes - 04/02/16 1500      Exercise Review   Progression Yes     Response to Exercise   Blood Pressure (Admit) 136/70   Blood Pressure (Exercise) 172/86   Blood Pressure (Exit) 130/80   Heart Rate (Admit) 84 bpm   Heart Rate (Exercise) 122 bpm   Heart Rate (Exit) 93 bpm   Rating of Perceived Exertion (Exercise) 15   Symptoms --  pt complained of claudication pain in LE   Comments HEP reviewed on 01/21/16   Duration Progress to 30 minutes of  continuous aerobic without signs/symptoms of physical distress   Intensity THRR unchanged     Progression   Progression Continue to progress workloads to maintain intensity without signs/symptoms of physical distress.   Average METs 2.6     Resistance Training   Training Prescription Yes   Weight 4lbs   Reps 10-12     Bike   Level 1.1   Minutes 15   METs 3.7     NuStep   Level 4   Minutes 15   METs 2.4     Home Exercise Plan   Plans to continue exercise at [place] on 01/21/16. See progress note   Frequency Add 2 additional days to program exercise sessions.      Nutrition:  Target Goals: Understanding of nutrition guidelines, daily intake of sodium <1580m, cholesterol <2060m calories 30% from fat and 7% or less from saturated fats, daily to have 5 or more servings of fruits and vegetables.  Biometrics:     Pre Biometrics - 01/09/16 1749      Pre Biometrics   Height _0  (1.626 m)   Weight 173 lb 1 oz (78.5 kg)   Waist Circumference 39.5 inches   Hip Circumference 44.75 inches   Waist to Hip Ratio 0.88 %   BMI (Calculated) 29.8   Triceps Skinfold 49 mm   % Body Fat 43.5 %   Grip Strength 38.5 kg   Flexibility 11 in   Single Leg Stand 41.69 seconds         Post Biometrics - 04/10/16 1459       Post  Biometrics   Height _1  (1.626 m)   Weight 167 lb 5.3 oz (75.9 kg)   Waist Circumference 36.25 inches   Hip Circumference 42.25 inches   Waist to Hip Ratio 0.86 %   BMI (Calculated) 28.8   Triceps Skinfold 34 mm   % Body Fat 39.8 %   Grip Strength 39 kg   Flexibility 11.5 in   Single Leg Stand 27.21 seconds      Nutrition Therapy Plan and Nutrition Goals:     Nutrition Therapy & Goals - 01/11/16 1400      Nutrition Therapy   Diet Carb Modified, Therapeutic Lifestyle Changes     Personal Nutrition Goals   Personal Goal #1 1-2 lb wt loss/week to a wt loss goal of 6-24 lb at graduation from [place] #2 Improved  glycemic control as evidenced by an A1c trending toward less than 7.0  Intervention Plan   Intervention Prescribe, educate and counsel regarding individualized specific dietary modifications aiming towards targeted core components such as weight, hypertension, lipid management, diabetes, heart failure and other comorbidities.   Expected Outcomes Short Term Goal: Understand basic principles of dietary content, such as calories, fat, sodium, cholesterol and nutrients.;Long Term Goal: Adherence to prescribed nutrition plan.      Nutrition Discharge: Nutrition Scores:     Nutrition Assessments - 01/11/16 1400      MEDFICTS Scores   Pre Score 32      Nutrition Goals Re-Evaluation:   Psychosocial: Target Goals: Acknowledge presence or absence of depression, maximize coping skills, provide positive support system. Participant is able to verbalize types and ability to use techniques and skills needed for reducing stress and depression.  Initial Review & Psychosocial Screening:     Initial Psych Review & Screening - 01/10/16 [place]? Yes     Barriers   Psychosocial barriers to participate in program There are no identifiable barriers or psychosocial needs.     Screening Interventions   Interventions Encouraged to exercise      Quality of Life Scores:     Quality of Life - 04/02/16 1445      Quality of Life Scores   Health/Function Pre 23.03 %   Health/Function Post 26.13 %   Health/Function % Change 13.46 %   Socioeconomic Pre 19.13 %   Socioeconomic Post 25.71 %   Socioeconomic % Change  34.4 %   Psych/Spiritual Pre 25.71 %   Psych/Spiritual Post 28.07 %   Psych/Spiritual % Change 9.18 %   Family Pre 28.8 %   Family Post 28.3 %   Family % Change -1.74 %   GLOBAL Post 26.76 %      PHQ-9: Recent Review Flowsheet Data    Depression screen [facility] 2/9 04/02/2016 01/11/2016 01/09/2016   Decreased Interest 0 0 0   Down, Depressed,  Hopeless 0 0 0   PHQ - 2 Score 0 0 0      Psychosocial Evaluation and Intervention:     Psychosocial Evaluation - 04/02/16 1718      Psychosocial Evaluation & Interventions   Comments no psychosocial needs identified, no interventions necessary    Continued Psychosocial Services Needed No     Discharge Psychosocial Assessment & Intervention   Discharge Continue support measures as needed      Psychosocial Re-Evaluation:     Psychosocial Re-Evaluation    [place] Name 03/11/16 1014             Psychosocial Re-Evaluation   Interventions Encouraged to attend Cardiac Rehabilitation for the exercise       Comments no psychosocial needs identified, no interventions necessary.       Continued Psychosocial Services Needed No          Vocational Rehabilitation: Provide vocational rehab assistance to qualifying candidates.   Vocational Rehab Evaluation & Intervention:     Vocational Rehab - 01/10/16 1529      Initial Vocational Rehab Evaluation & Intervention   Assessment shows need for Vocational Rehabilitation No      Education: Education Goals: Education classes will be provided on a weekly basis, covering required topics. Participant will state understanding/return demonstration of topics presented.  Learning Barriers/Preferences:     Learning Barriers/Preferences - 01/09/16 1740      Learning Barriers/Preferences   Learning Barriers None   Learning Preferences Video;Pictoral  Education Topics: Count Your Pulse:  -Group instruction provided by verbal instruction, demonstration, patient participation and written materials to support subject.  Instructors address importance of being able to find your pulse and how to count your pulse when at home without a heart monitor.  Patients get hands on experience counting their pulse with staff help and individually.   Heart Attack, Angina, and Risk Factor Modification:  -Group instruction provided by verbal  instruction, video, and written materials to support subject.  Instructors address signs and symptoms of angina and heart attacks.    Also discuss risk factors for heart disease and how to make changes to improve heart health risk factors.   Functional Fitness:  -Group instruction provided by verbal instruction, demonstration, patient participation, and written materials to support subject.  Instructors address safety measures for doing things around the house.  Discuss how to get up and down off the floor, how to pick things up properly, how to safely get out of a chair without assistance, and balance training.   Meditation and Mindfulness:  -Group instruction provided by verbal instruction, patient participation, and written materials to support subject.  Instructor addresses importance of mindfulness and meditation practice to help reduce stress and improve awareness.  Instructor also leads participants through a meditation exercise.    Stretching for Flexibility and Mobility:  -Group instruction provided by verbal instruction, patient participation, and written materials to support subject.  Instructors lead participants through series of stretches that are designed to increase flexibility thus improving mobility.  These stretches are additional exercise for major muscle groups that are typically performed during regular warm up and cool down.   Hands Only CPR Anytime:  -Group instruction provided by verbal instruction, video, patient participation and written materials to support subject.  Instructors co-teach with AHA video for hands only CPR.  Participants get hands on experience with mannequins.   Nutrition I class: Heart Healthy Eating:  -Group instruction provided by PowerPoint slides, verbal discussion, and written materials to support subject matter. The instructor gives an explanation and review of the Therapeutic Lifestyle Changes diet recommendations, which includes a discussion on  lipid goals, dietary fat, sodium, fiber, plant stanol/sterol esters, sugar, and the components of a well-balanced, healthy diet. Flowsheet Row CARDIAC REHAB PHASE II EXERCISE from 02/18/2016 in [place]  Date  02/18/16  Educator  RD  Instruction Review Code  Not applicable [Class handout given]      Nutrition II class: Lifestyle Skills:  -Group instruction provided by PowerPoint slides, verbal discussion, and written materials to support subject matter. The instructor gives an explanation and review of label reading, grocery shopping for heart health, heart healthy recipe modifications, and ways to make healthier choices when eating out. Flowsheet Row CARDIAC REHAB PHASE II EXERCISE from 02/18/2016 in [place]  Date  02/18/16  Educator  RD  Instruction Review Code  Not applicable [Class handout given]      Diabetes Question & Answer:  -Group instruction provided by PowerPoint slides, verbal discussion, and written materials to support subject matter. The instructor gives an explanation and review of diabetes co-morbidities, pre- and post-prandial blood glucose goals, pre-exercise blood glucose goals, signs, symptoms, and treatment of hypoglycemia and hyperglycemia, and foot care basics. Flowsheet Row CARDIAC REHAB PHASE II EXERCISE from 02/18/2016 in [place]  Date  01/11/16  Educator  RD  Instruction Review Code  2- meets goals/outcomes  Diabetes Blitz:  -Group instruction provided by PowerPoint slides, verbal discussion, and written materials to support subject matter. The instructor gives an explanation and review of the physiology behind type 1 and type 2 diabetes, diabetes medications and rational behind using different medications, pre- and post-prandial blood glucose recommendations and Hemoglobin A1c goals, diabetes diet, and exercise including blood glucose guidelines for  exercising safely.  Flowsheet Row CARDIAC REHAB PHASE II EXERCISE from 02/18/2016 in [place]  Date  02/18/16  Educator  RD  Instruction Review Code  Not applicable [Class handout given]      Portion Distortion:  -Group instruction provided by PowerPoint slides, verbal discussion, written materials, and food models to support subject matter. The instructor gives an explanation of serving size versus portion size, changes in portions sizes over the last 20 years, and what consists of a serving from each food group.   Stress Management:  -Group instruction provided by verbal instruction, video, and written materials to support subject matter.  Instructors review role of stress in heart disease and how to cope with stress positively.     Exercising on Your Own:  -Group instruction provided by verbal instruction, power point, and written materials to support subject.  Instructors discuss benefits of exercise, components of exercise, frequency and intensity of exercise, and end points for exercise.  Also discuss use of nitroglycerin and activating EMS.  Review options of places to exercise outside of rehab.  Review guidelines for sex with heart disease.   Cardiac Drugs I:  -Group instruction provided by verbal instruction and written materials to support subject.  Instructor reviews cardiac drug classes: antiplatelets, anticoagulants, beta blockers, and statins.  Instructor discusses reasons, side effects, and lifestyle considerations for each drug class.   Cardiac Drugs II:  -Group instruction provided by verbal instruction and written materials to support subject.  Instructor reviews cardiac drug classes: angiotensin converting enzyme inhibitors (ACE-I), angiotensin II receptor blockers (ARBs), nitrates, and calcium channel blockers.  Instructor discusses reasons, side effects, and lifestyle considerations for each drug class. Flowsheet Row CARDIAC REHAB PHASE  II EXERCISE from 02/18/2016 in [place]  Date  01/16/16  Educator  Pharm D  Instruction Review Code  2- meets goals/outcomes      Anatomy and Physiology of the Circulatory System:  -Group instruction provided by verbal instruction, video, and written materials to support subject.  Reviews functional anatomy of heart, how it relates to various diagnoses, and what role the heart plays in the overall system. Flowsheet Row CARDIAC REHAB PHASE II EXERCISE from 02/18/2016 in [place]  Date  01/09/16  Instruction Review Code  2- meets goals/outcomes      Knowledge Questionnaire Score:     Knowledge Questionnaire Score - 04/02/16 1359      Knowledge Questionnaire Score   Post Score 25/28      Core Components/Risk Factors/Patient Goals at Admission:     Personal Goals and Risk Factors at Admission - 01/09/16 1801      Core Components/Risk Factors/Patient Goals on Admission    Weight Management Obesity;Yes   Intervention Weight Management: Develop a combined nutrition and exercise program designed to reach desired caloric intake, while maintaining appropriate intake of nutrient and fiber, sodium and fats, and appropriate energy expenditure required for the weight goal.;Weight Management: Provide education and appropriate resources to help participant work on and attain dietary goals.;Weight Management/Obesity: Establish reasonable short term and long term weight goals.;Obesity:  Provide education and appropriate resources to help participant work on and attain dietary goals.   Admit Weight 173 lb 1 oz (78.5 kg)   Goal Weight: Short Term 167 lb 11.2 oz (76.1 kg)   Goal Weight: Long Term 162 lb 11.2 oz (73.8 kg)   Expected Outcomes Short Term: Continue to assess and modify interventions until short term weight is achieved;Long Term: Adherence to nutrition and physical activity/exercise program aimed toward attainment of  established weight goal;Weight Loss: Understanding of general recommendations for a balanced deficit meal plan, which promotes 1-2 lb weight loss per week and includes a negative energy balance of [phone] kcal/d   Sedentary Yes   Intervention Provide advice, education, support and counseling about physical activity/exercise needs.;Develop an individualized exercise prescription for aerobic and resistive training based on initial evaluation findings, risk stratification, comorbidities and participant's personal goals.   Expected Outcomes Achievement of increased cardiorespiratory fitness and enhanced flexibility, muscular endurance and strength shown through measurements of functional capacity and personal statement of participant.   Increase Strength and Stamina Yes   Intervention Provide advice, education, support and counseling about physical activity/exercise needs.;Develop an individualized exercise prescription for aerobic and resistive training based on initial evaluation findings, risk stratification, comorbidities and participant's personal goals.   Expected Outcomes Achievement of increased cardiorespiratory fitness and enhanced flexibility, muscular endurance and strength shown through measurements of functional capacity and personal statement of participant.   Tobacco Cessation Yes   Intervention Assist the participant in steps to quit. Provide individualized education and counseling about committing to Tobacco Cessation, relapse prevention, and pharmacological support that can be provided by physician.;Advice worker, assist with locating and accessing local/national Quit Smoking programs, and support quit date choice.   Expected Outcomes Short Term: Will demonstrate readiness to quit, by selecting a quit date.;Long Term: Complete abstinence from all tobacco products for at least 12 months from quit date.;Short Term: Will quit all tobacco product use, adhering to prevention of  relapse plan.   Diabetes Yes   Intervention Provide education about signs/symptoms and action to take for hypo/hyperglycemia.;Provide education about proper nutrition, including hydration, and aerobic/resistive exercise prescription along with prescribed medications to achieve blood glucose in normal ranges: Fasting glucose 65-99 mg/dL   Expected Outcomes Short Term: Participant verbalizes understanding of the signs/symptoms and immediate care of hyper/hypoglycemia, proper foot care and importance of medication, aerobic/resistive exercise and nutrition plan for blood glucose control.;Long Term: Attainment of HbA1C < 7%.   Hypertension Yes   Intervention Provide education on lifestyle modifcations including regular physical activity/exercise, weight management, moderate sodium restriction and increased consumption of fresh fruit, vegetables, and low fat dairy, alcohol moderation, and smoking cessation.;Monitor prescription use compliance.   Expected Outcomes Short Term: Continued assessment and intervention until BP is < 140/38m HG in hypertensive participants. < 130/85mHG in hypertensive participants with diabetes, heart failure or chronic kidney disease.;Long Term: Maintenance of blood pressure at goal levels.   Lipids Yes   Intervention Provide education and support for participant on nutrition & aerobic/resistive exercise along with prescribed medications to achieve LDL <7073mHDL >70m13m Expected Outcomes Short Term: Participant states understanding of desired cholesterol values and is compliant with medications prescribed. Participant is following exercise prescription and nutrition guidelines.;Long Term: Cholesterol controlled with medications as prescribed, with individualized exercise RX and with personalized nutrition plan. Value goals: LDL < 70mg7mL > 40 mg.   Stress Yes   Intervention Offer individual and/or small group education and counseling on adjustment to  heart disease, stress  management and health-related lifestyle change. Teach and support self-help strategies.   Expected Outcomes Short Term: Participant demonstrates changes in health-related behavior, relaxation and other stress management skills, ability to obtain effective social support, and compliance with psychotropic medications if prescribed.;Long Term: Emotional wellbeing is indicated by absence of clinically significant psychosocial distress or social isolation.   Personal Goal Other Yes   Personal Goal Decreased claudication symptoms. Improve heart healthy lifestyle.   Intervention Provide educaiton regarding walking program and parameters to assist with building collateral circulaiton to help decrease claudication symptoms. Provide education regarding risk factor modification and heart healthy lifestyle.    Expected Outcomes Patient will be more knowledgeable about heart healthy lifestyle and adopt better habits to improve heart health.      Core Components/Risk Factors/Patient Goals Review:      Goals and Risk Factor Review    Row Name 02/13/16 1643 03/11/16 1139           Core Components/Risk Factors/Patient Goals Review   Personal Goals Review Other;Improve shortness of breath with ADL's Other;Increase Strength and Stamina      Review Pt is able to perform ADL's and go to work without fatigue or SOB. Pt paces herself with activities and able to complete all tasks on her own. Claudication symptoms are decreasing and able to keep up and play basketball with grandkids      Expected Outcomes Pt will continue to perform household chores and go to work while experiencing less claudication symptoms with actvities. Pt will continue to have less claudication pain/symptoms and be able to keep up with grandkids         Core Components/Risk Factors/Patient Goals at Discharge (Final Review):      Goals and Risk Factor Review - 03/11/16 1139      Core Components/Risk Factors/Patient Goals Review    Personal Goals Review Other;Increase Strength and Stamina   Review Claudication symptoms are decreasing and able to keep up and play basketball with grandkids   Expected Outcomes Pt will continue to have less claudication pain/symptoms and be able to keep up with grandkids      ITP Comments:     ITP Comments    Row Name 01/09/16 1801           ITP Comments Medical Director- Dr. Fransico Him MD          Comments: Pt graduated from cardiac rehab program today with completion of exercise sessions in Phase II. Pt maintained good attendance and progressed nicely during her  participation in rehab as evidenced by increased MET level.  Pt is exiting early due to copayment cost.    Medication list reconciled. Repeat  PHQ score-0  .  Pt has made significant lifestyle changes and should be commended for her success. Pt feelss he has achieved her  goals during cardiac rehab.   Pt plans to continue exercising on her own

## 2016-04-04 ENCOUNTER — Encounter (HOSPITAL_COMMUNITY): Payer: Commercial Managed Care - HMO

## 2016-04-06 ENCOUNTER — Other Ambulatory Visit (HOSPITAL_COMMUNITY): Payer: Self-pay | Admitting: Pharmacist

## 2016-04-06 MED ORDER — ISOSORB DINITRATE-HYDRALAZINE 20-37.5 MG PO TABS: 1.0000 | ORAL_TABLET | Freq: Three times a day (TID) | ORAL | 5 refills | Status: DC

## 2016-04-07 ENCOUNTER — Telehealth (HOSPITAL_COMMUNITY): Payer: Self-pay | Admitting: Pharmacist

## 2016-04-07 ENCOUNTER — Encounter (HOSPITAL_COMMUNITY): Payer: Commercial Managed Care - HMO

## 2016-04-07 NOTE — Telephone Encounter (Signed)
-----   Message from Noralee Space, RN sent at 04/03/2016 12:21 PM EST ----- Regarding: FW: cardiac rehab  Hey can you help with this please, thanks  ----- Message ----- From: Robyne Peers, RN Sent: 04/02/2016   5:22 PM To: Noralee Space, RN Subject: cardiac rehab                                  Dear Herbert Seta,   Pt states she has run of Bidil and unable to get refilled.  So she is not sure if she should continue.  I dont see in her last office note where it has been discontinued.  Can you contact her and let her know what she should do?  She graduated from cardiac rehab program today.   Thank you, Deveron Furlong, RN, BSN Cardiac Pulmonary Rehab

## 2016-04-07 NOTE — Telephone Encounter (Signed)
Patient needed new refill for Bidil which I sent to [facility] and verified that copay will be $18/mo.  Tyler Deis. Bonnye Fava, PharmD, BCPS, CPP Clinical Pharmacist Pager: [phone] Phone: [phone] 04/07/2016 12:10 PM

## 2016-04-08 ENCOUNTER — Encounter (HOSPITAL_COMMUNITY): Payer: Self-pay | Admitting: Internal Medicine

## 2016-04-08 ENCOUNTER — Ambulatory Visit (HOSPITAL_COMMUNITY)
Admission: RE | Admit: 2016-04-08 | Discharge: 2016-04-08 | Disposition: A | Discharge status: Home / Self Care | Payer: Commercial Managed Care - HMO | Source: Ambulatory Visit | Attending: Internal Medicine | Admitting: Internal Medicine

## 2016-04-08 VITALS — BP 138/80 | HR 87 | Wt 170.8 lb

## 2016-04-08 DIAGNOSIS — I110 Hypertensive heart disease with heart failure: Secondary | ICD-10-CM | POA: Insufficient documentation

## 2016-04-08 DIAGNOSIS — I739 Peripheral vascular disease, unspecified: Secondary | ICD-10-CM

## 2016-04-08 DIAGNOSIS — F17210 Nicotine dependence, cigarettes, uncomplicated: Secondary | ICD-10-CM | POA: Insufficient documentation

## 2016-04-08 DIAGNOSIS — G4733 Obstructive sleep apnea (adult) (pediatric): Secondary | ICD-10-CM | POA: Insufficient documentation

## 2016-04-08 DIAGNOSIS — I428 Other cardiomyopathies: Secondary | ICD-10-CM | POA: Diagnosis not present

## 2016-04-08 DIAGNOSIS — I5022 Chronic systolic (congestive) heart failure: Secondary | ICD-10-CM | POA: Diagnosis not present

## 2016-04-08 DIAGNOSIS — E1151 Type 2 diabetes mellitus with diabetic peripheral angiopathy without gangrene: Secondary | ICD-10-CM | POA: Insufficient documentation

## 2016-04-08 DIAGNOSIS — E669 Obesity, unspecified: Secondary | ICD-10-CM | POA: Diagnosis not present

## 2016-04-08 DIAGNOSIS — I2510 Atherosclerotic heart disease of native coronary artery without angina pectoris: Secondary | ICD-10-CM | POA: Insufficient documentation

## 2016-04-08 DIAGNOSIS — I252 Old myocardial infarction: Secondary | ICD-10-CM | POA: Insufficient documentation

## 2016-04-08 DIAGNOSIS — Z955 Presence of coronary angioplasty implant and graft: Secondary | ICD-10-CM | POA: Diagnosis not present

## 2016-04-08 DIAGNOSIS — Z7982 Long term (current) use of aspirin: Secondary | ICD-10-CM | POA: Diagnosis not present

## 2016-04-08 DIAGNOSIS — Z79899 Other long term (current) drug therapy: Secondary | ICD-10-CM | POA: Insufficient documentation

## 2016-04-08 MED ORDER — CARVEDILOL 3.125 MG PO TABS
3.1250 mg | ORAL_TABLET | Freq: Two times a day (BID) | ORAL | 6 refills | Status: DC
Start: 2016-04-08 — End: 2016-11-25

## 2016-04-08 MED ORDER — ISOSORB DINITRATE-HYDRALAZINE 20-37.5 MG PO TABS: 1.0000 | ORAL_TABLET | Freq: Three times a day (TID) | ORAL | 6 refills | Status: DC

## 2016-04-08 NOTE — Progress Notes (Signed)
Patient ID: Sandra Owens, female   DOB: Nov 26, 1965, 50 y.o.   MRN: [id number] .  Patient ID: Sandra Owens, female   DOB: Jul 26, 1965, 50 y.o.   MRN: [id number] . PCP: Merri Brunette   HPI:  [name] is a 45 y/o woman with obesity, HTN, DM2, CAD chronic systolic HF due to primarily NICM (diagnosed 7/16).   Admitted 6/17 with NSTEMI and decompensated HF. Echo showed EF of 15-20% with diffuse hypokinesis. Had right and left heart cath on 10/1215 which showed severe biventricular CHF EF less than 20%, severe pulmonary hypertension with PA pressure 50mm Hg. severe 95% mid LAD stenosis, new likely culprit for patient's ACS. Had staged PCI of LAD on 6/14 complicated with transient cardiogenic shock during the interventional part of cardiac cath du to jailing of a septal branch    She presents today for regular follow up. Echo on 9/17 with EF back to 45-50%.  On Entresto 97/103, carvedilol 3.125 bid, spiro 12.5. Was on Bidil 1 tab bid (not taking x 1 month). Feels great. Graduated CR. Now has [employer]. Had ABIs which showed moderately to severely reduced ABI bilaterally with evidence of SFA occlusion bilaterally with reconstitution via collaterals. Referred to Dr. Kirke Corin who recommended trial of exercise and smoking cessation. Much improved. Stays busy. Doing all ADLs and all housework without any problem. Denies any dyspnea or CP. Only smoking 3 cigarettes per day.   Cath 6/17  LAD 90% LCX 70% RCA 40%   RA 20 RV 67/22 PA 76/33 (53) PCWP 44 CO/CI = 3.3/1.7   ROS: All systems negative except as listed in HPI, PMH and Problem List.  SH:  Social History   Social History  . Marital status: Single    Spouse name: N/A  . Number of children: 3  . Years of education: N/A   Occupational History  . Not on file.   Social History Main Topics  . Smoking status: Current Every Day Smoker    Packs/day: 0.25    Years: 30.00    Types: Cigarettes    Last attempt to quit: 10/12/2015  .  Smokeless tobacco: Never Used  . Alcohol use No  . Drug use: No  . Sexual activity: Not on file   Other Topics Concern  . 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         FH:  Family History  Problem Relation Age of Onset  . Hypertension Father   . Diabetes Mellitus II Father   . CAD Father     CABG in his 75s  . Hypertension Mother   . Diabetes Mellitus II Mother   . Stroke Paternal Grandmother     Past Medical History:  Diagnosis Date  . Coronary artery disease   . Essential hypertension   . GERD (gastroesophageal reflux disease)   . Hyperlipidemia   . Low back pain   . Systolic CHF (HCC)    a. Found to have new low EF 11/2014 with abnormal nuc.  . Type 2 diabetes mellitus (HCC)     Current Outpatient Prescriptions  Medication Sig Dispense Refill  . aspirin EC 81 MG tablet Take 1 tablet (81 mg total) by mouth daily. 90 tablet 3  . atorvastatin (LIPITOR) 80 MG tablet Take 1 tablet (80 mg total) by mouth daily at 6 PM. 30 tablet 6  . carvedilol (COREG) 3.125 MG tablet Take 1 tablet (3.125 mg total) by mouth  2 (two) times daily. 180 tablet 3  . citalopram (CELEXA) 10 MG tablet Take 1 tablet (10 mg total) by mouth daily. 30 tablet 6  . famotidine (PEPCID AC) 10 MG chewable tablet Chew 10 mg by mouth daily as needed for heartburn.    Marland Kitchen. glucose blood test strip 1 each by Other route See admin instructions. Check blood sugar once daily    . nicotine (NICODERM CQ - DOSED IN MG/24 HOURS) 14 mg/24hr patch Place 1 patch (14 mg total) onto the skin daily. 28 patch 3  . nitroGLYCERIN (NITROSTAT) 0.4 MG SL tablet Place 1 tablet (0.4 mg total) under the tongue every 5 (five) minutes as needed for chest pain. 30 tablet 12  . sacubitril-valsartan (ENTRESTO) 97-103 MG Take 1 tablet by mouth 2 (two) times daily. 60 tablet 3  . sitaGLIPtin (JANUVIA) 100 MG tablet Take 100 mg by mouth daily.    Marland Kitchen. spironolactone (ALDACTONE) 25 MG tablet  Take 0.5 tablets (12.5 mg total) by mouth daily. 30 tablet 3  . ticagrelor (BRILINTA) 90 MG TABS tablet Take 1 tablet (90 mg total) by mouth 2 (two) times daily. 60 tablet 3  . tobramycin (TOBREX) 0.3 % ophthalmic solution Place 1-2 drops into the left eye as needed (for eye infection).    . isosorbide-hydrALAZINE (BIDIL) 20-37.5 MG tablet Take 1 tablet by mouth 3 (three) times daily. (Patient not taking: Reported on 04/08/2016) 90 tablet 5   No current facility-administered medications for this encounter.     Vitals:   04/08/16 1010  BP: 138/80  Pulse: 87  SpO2: 97%  Weight: 170 lb 12.8 oz (77.5 kg)   Wt Readings from Last 3 Encounters:  04/08/16 170 lb 12.8 oz (77.5 kg)  03/11/16 168 lb 12.8 oz (76.6 kg)  01/10/16 173 lb 12.8 oz (78.8 kg)     PHYSICAL EXAM:  General:  Well appearing. No resp difficulty HEENT: normal Neck: supple. JVP flat. Carotids 2+ bilaterally; no bruits. No lymphadenopathy or thryomegaly appreciated. Cor: PMI normal. Tachycardia regular. No rubs, gallops or murmurs. Lungs: clear Abdomen: obese soft, nontender, nondistended. No hepatosplenomegaly. No bruits or masses. Good bowel sounds. Extremities: no cyanosis, clubbing, rash, edema Neuro: alert & orientedx3, cranial nerves grossly intact. Moves all 4 extremities w/o difficulty. Affect pleasant.   ASSESSMENT & PLAN: 1. Chronic systolic heart failure  - due mostly to NICM. EF 15-20% with biventricular dysfunction in 6/17  - echo 9/17 with EF 45-50%. Much improved. NYHA I. Volume status looks good  - continue Entresto, carvedilol and spiro. Restart Bidil 2. CAD s/p NSTEMI 6/17   -- s/p DES to LAD 6/17   --continue ASA, statin, b-blocker and Brilinta   --PCP to follow lipids 3. Tobacco abuse   --has cut back dramatically. Trying to quit.  4. OSA 5. DM2 6. PAD with claudication   --ABI suggested of bilateral SFA occlusion. Follows with Dr. Kirke CorinArida. Symptoms much improved with exercise and cutting back on  cigarettes 7. HTN   --BP up restart Bidil.  Peri Kreft,MD 10:50 AM

## 2016-04-08 NOTE — Addendum Note (Signed)
Encounter addended by: Suezanne Cheshire, RN on: 04/08/2016 11:10 AM<BR>    Actions taken: Order list changed, Sign clinical note

## 2016-04-08 NOTE — Patient Instructions (Signed)
Follow up in 6 months 

## 2016-04-09 ENCOUNTER — Encounter (HOSPITAL_COMMUNITY): Payer: Commercial Managed Care - HMO

## 2016-04-11 ENCOUNTER — Encounter (HOSPITAL_COMMUNITY): Payer: Commercial Managed Care - HMO

## 2016-04-14 ENCOUNTER — Encounter (HOSPITAL_COMMUNITY): Payer: Commercial Managed Care - HMO

## 2016-04-16 ENCOUNTER — Encounter (HOSPITAL_COMMUNITY): Payer: Commercial Managed Care - HMO

## 2016-04-18 ENCOUNTER — Encounter (HOSPITAL_COMMUNITY): Payer: Commercial Managed Care - HMO

## 2016-04-21 ENCOUNTER — Encounter (HOSPITAL_COMMUNITY): Payer: Commercial Managed Care - HMO

## 2016-04-23 ENCOUNTER — Encounter (HOSPITAL_COMMUNITY): Payer: Commercial Managed Care - HMO

## 2016-04-25 ENCOUNTER — Other Ambulatory Visit (HOSPITAL_COMMUNITY): Payer: Self-pay | Admitting: Internal Medicine

## 2016-04-25 ENCOUNTER — Encounter (HOSPITAL_COMMUNITY): Payer: Commercial Managed Care - HMO

## 2016-04-30 ENCOUNTER — Encounter (HOSPITAL_COMMUNITY): Payer: Commercial Managed Care - HMO

## 2016-05-02 ENCOUNTER — Encounter (HOSPITAL_COMMUNITY): Payer: Commercial Managed Care - HMO

## 2016-05-07 ENCOUNTER — Encounter (HOSPITAL_COMMUNITY): Payer: Managed Care, Other (non HMO)

## 2016-05-14 ENCOUNTER — Other Ambulatory Visit (HOSPITAL_COMMUNITY): Payer: Self-pay | Admitting: *Deleted

## 2016-05-14 MED ORDER — ATORVASTATIN CALCIUM 80 MG PO TABS: 80.0000 mg | ORAL_TABLET | Freq: Every day | ORAL | 6 refills | Status: DC

## 2016-08-24 ENCOUNTER — Other Ambulatory Visit (HOSPITAL_COMMUNITY): Payer: Self-pay | Admitting: Internal Medicine

## 2016-11-12 ENCOUNTER — Other Ambulatory Visit (HOSPITAL_COMMUNITY): Payer: Self-pay | Admitting: Cardiology

## 2016-11-12 MED ORDER — TICAGRELOR 90 MG PO TABS
90.0000 mg | ORAL_TABLET | Freq: Two times a day (BID) | ORAL | 3 refills | Status: DC
Start: 2016-11-12 — End: 2017-02-24

## 2016-11-25 ENCOUNTER — Ambulatory Visit (INDEPENDENT_AMBULATORY_CARE_PROVIDER_SITE_OTHER): Payer: Commercial Managed Care - HMO | Admitting: Cardiovascular Disease

## 2016-11-25 ENCOUNTER — Encounter: Payer: Self-pay | Admitting: Cardiovascular Disease

## 2016-11-25 VITALS — BP 137/78 | HR 103 | Ht 64.0 in | Wt 185.2 lb

## 2016-11-25 DIAGNOSIS — I10 Essential (primary) hypertension: Secondary | ICD-10-CM

## 2016-11-25 DIAGNOSIS — Z720 Tobacco use: Secondary | ICD-10-CM

## 2016-11-25 DIAGNOSIS — I2510 Atherosclerotic heart disease of native coronary artery without angina pectoris: Secondary | ICD-10-CM | POA: Diagnosis not present

## 2016-11-25 DIAGNOSIS — E785 Hyperlipidemia, unspecified: Secondary | ICD-10-CM

## 2016-11-25 DIAGNOSIS — I739 Peripheral vascular disease, unspecified: Secondary | ICD-10-CM | POA: Diagnosis not present

## 2016-11-25 MED ORDER — CARVEDILOL 6.25 MG PO TABS: 6.2500 mg | ORAL_TABLET | Freq: Two times a day (BID) | ORAL | 3 refills | Status: DC

## 2016-11-25 NOTE — Progress Notes (Signed)
Cardiology Office Note   Date:  11/25/2016   ID:  Sandra Owens, DOB Jan 04, 1966, MRN [id number]  PCP:  Merri Brunette, MD  Cardiologist: Dr. Gala Romney  Chief Complaint  Patient presents with  . Follow-up      History of Present Illness: Sandra Owens is a 51 y.o. female who is here today for a follow up visit regarding  peripheral arterial disease. She has known history of chronic systolic heart failure and coronary artery disease. Ejection fraction initially in June, 2017 was 15-20%. She had LAD PCI. Ejection fraction improved to 45% on most recent evaluation.  She was seen last year for severe bilateral calf claudication. Noninvasive vascular evaluation showed moderately to severely reduced ABI bilaterally with evidence of SFA occlusion bilaterally with reconstitution via collaterals.  Symptoms improved with a walking program and she did not require revascularization. Overall, she has been doing well with no chest pain or shortness of breath. She reports no claudication when walking on a flat level that she does have some discomfort if she goes uphill. Unfortunately, she continues to smoke.  Past Medical History:  Diagnosis Date  . Coronary artery disease   . Essential hypertension   . GERD (gastroesophageal reflux disease)   . Hyperlipidemia   . Low back pain   . Systolic CHF (HCC)    a. Found to have new low EF 11/2014 with abnormal nuc.  . Type 2 diabetes mellitus (HCC)     Past Surgical History:  Procedure Laterality Date  . CARDIAC CATHETERIZATION N/A 11/23/2014   Procedure: Right/Left Heart Cath and Coronary Angiography;  Surgeon: Corky Crafts, MD;  Location[facility] INVASIVE CV LAB;  Service: Cardiovascular;  Laterality: N/A;  . CARDIAC CATHETERIZATION N/A 10/15/2015   Procedure: Right/Left Heart Cath and Coronary Angiography;  Surgeon: Lyn Records, MD;  Location: [facility] INVASIVE CV LAB;  Service: Cardiovascular;  Laterality: N/A;  . CARDIAC CATHETERIZATION  N/A 10/17/2015   Procedure: Coronary Stent Intervention;  Surgeon: Lyn Records, MD;  Location: [facility] INVASIVE CV LAB;  Service: Cardiovascular;  Laterality: N/A;  . CORONARY ANGIOPLASTY    . TUBAL LIGATION       Current Outpatient Prescriptions  Medication Sig Dispense Refill  . aspirin EC 81 MG tablet Take 1 tablet (81 mg total) by mouth daily. 90 tablet 3  . atorvastatin (LIPITOR) 80 MG tablet Take 1 tablet (80 mg total) by mouth daily at 6 PM. 30 tablet 6  . carvedilol (COREG) 6.25 MG tablet Take 1 tablet (6.25 mg total) by mouth 2 (two) times daily. 180 tablet 3  . ENTRESTO 97-103 MG TAKE ONE TABLET BY MOUTH TWICE DAILY 60 tablet 3  . famotidine (PEPCID AC) 10 MG chewable tablet Chew 10 mg by mouth daily as needed for heartburn.    Marland Kitchen glucose blood test strip 1 each by Other route See admin instructions. Check blood sugar once daily    . isosorbide-hydrALAZINE (BIDIL) 20-37.5 MG tablet Take 1 tablet by mouth 3 (three) times daily. 90 tablet 6  . nicotine (NICODERM CQ - DOSED IN MG/24 HOURS) 14 mg/24hr patch Place 1 patch (14 mg total) onto the skin daily. 28 patch 3  . nitroGLYCERIN (NITROSTAT) 0.4 MG SL tablet Place 1 tablet (0.4 mg total) under the tongue every 5 (five) minutes as needed for chest pain. 30 tablet 12  . sitaGLIPtin (JANUVIA) 100 MG tablet Take 100 mg by mouth daily.    . ticagrelor (BRILINTA) 90 MG TABS tablet Take 1 tablet (  90 mg total) by mouth 2 (two) times daily. 60 tablet 3  . tobramycin (TOBREX) 0.3 % ophthalmic solution Place 1-2 drops into the left eye as needed (for eye infection).     No current facility-administered medications for this visit.     Allergies:   Patient has no known allergies.    Social History:  The patient  reports that she has been smoking Cigarettes.  She has a 7.50 pack-year smoking history. She has never used smokeless tobacco. She reports that she does not drink alcohol or use drugs.   Family History:  The patient's family history  includes CAD in her father; Diabetes Mellitus II in her father and mother; Hypertension in her father and mother; Stroke in her paternal grandmother.    ROS:  Please see the history of present illness.   Otherwise, review of systems are positive for none.   All other systems are reviewed and negative.    PHYSICAL EXAM: VS:  BP 137/78   Pulse (!) 103   Ht 5\' 4"  (1.626 m)   Wt 185 lb 3.2 oz (84 kg)   BMI 31.79 kg/m  , BMI Body mass index is 31.79 kg/m. GEN: Well nourished, well developed, in no acute distress  HEENT: normal  Neck: no JVD, carotid bruits, or masses Cardiac: RRR; no murmurs, rubs, or gallops,no edema  Respiratory:  clear to auscultation bilaterally, normal work of breathing GI: soft, nontender, nondistended, + BS MS: no deformity or atrophy  Skin: warm and dry, no rash Neuro:  Strength and sensation are intact Psych: euthymic mood, full affect Vascular: Distal pulses are not palpable.   EKG:  EKG is ordered today. EKG showed sinus tachycardia with no significant ST or T wave changes.   Recent Labs: 12/21/2015: BUN 16; Creatinine, Ser 1.35; Potassium 3.9; Sodium 139    Lipid Panel    Component Value Date/Time   CHOL 199 10/14/2015 0417   TRIG 122 10/14/2015 0417   HDL 38 (L) 10/14/2015 0417   CHOLHDL 5.2 10/14/2015 0417   VLDL 24 10/14/2015 0417   LDLCALC 137 (H) 10/14/2015 0417      Wt Readings from Last 3 Encounters:  11/25/16 185 lb 3.2 oz (84 kg)  04/08/16 170 lb 12.8 oz (77.5 kg)  04/10/16 167 lb 5.3 oz (75.9 kg)        ASSESSMENT AND PLAN:  1.  Peripheral arterial disease with  bilateral leg claudication due to bilateral SFA occlusion:  Her claudication at the present time is minimal and not lifestyle limiting. Thus, I recommend continuing medical therapy.  2. Coronary artery disease involving native coronary arteries without angina:  She is doing well with no anginal symptoms.   3. Chronic systolic heart failure: She appears to be  euvolemic and currently on optimal medical therapy. Given resting tachycardia, I increased carvedilol to 6.25 mg twice daily.  4. Tobacco use:  Unfortunately, she continues to smoke. I had a prolonged discussion with her again about the importance of smoking cessation.  5. Hyperlipidemia: Continue treatment with atorvastatin with a target LDL of less than 70.    Disposition:   FU with me in 12 months  Signed,  Lorine Bears, MD  11/25/2016 10:17 AM    [facility]

## 2016-11-25 NOTE — Patient Instructions (Signed)
Medication Instructions: INCREASE your Carvedilol to 6.25 mg tablet twice a day   Follow-Up: Your physician wants you to follow-up in: 12 months with Dr. Katheren Puller will receive a reminder letter in the mail two months in advance. If you don't receive a letter, please call our office to schedule the follow-up appointment.    If you need a refill on your cardiac medications before your next appointment, please call your pharmacy.

## 2017-01-19 ENCOUNTER — Other Ambulatory Visit (HOSPITAL_COMMUNITY): Payer: Self-pay | Admitting: Cardiology

## 2017-01-19 MED ORDER — ATORVASTATIN CALCIUM 80 MG PO TABS: 80.0000 mg | ORAL_TABLET | Freq: Every day | ORAL | 0 refills | Status: DC

## 2017-02-17 ENCOUNTER — Ambulatory Visit (HOSPITAL_COMMUNITY)
Admission: RE | Admit: 2017-02-17 | Discharge: 2017-02-17 | Disposition: A | Discharge status: Home / Self Care | Payer: Commercial Managed Care - HMO | Source: Ambulatory Visit | Attending: Internal Medicine | Admitting: Internal Medicine

## 2017-02-17 ENCOUNTER — Encounter (HOSPITAL_COMMUNITY): Payer: Self-pay | Admitting: Internal Medicine

## 2017-02-17 VITALS — BP 167/90 | HR 88 | Wt 188.5 lb

## 2017-02-17 DIAGNOSIS — Z823 Family history of stroke: Secondary | ICD-10-CM | POA: Diagnosis not present

## 2017-02-17 DIAGNOSIS — E1151 Type 2 diabetes mellitus with diabetic peripheral angiopathy without gangrene: Secondary | ICD-10-CM | POA: Insufficient documentation

## 2017-02-17 DIAGNOSIS — Z8249 Family history of ischemic heart disease and other diseases of the circulatory system: Secondary | ICD-10-CM | POA: Diagnosis not present

## 2017-02-17 DIAGNOSIS — F17210 Nicotine dependence, cigarettes, uncomplicated: Secondary | ICD-10-CM | POA: Insufficient documentation

## 2017-02-17 DIAGNOSIS — I10 Essential (primary) hypertension: Secondary | ICD-10-CM

## 2017-02-17 DIAGNOSIS — I5022 Chronic systolic (congestive) heart failure: Secondary | ICD-10-CM | POA: Insufficient documentation

## 2017-02-17 DIAGNOSIS — I2510 Atherosclerotic heart disease of native coronary artery without angina pectoris: Secondary | ICD-10-CM | POA: Diagnosis not present

## 2017-02-17 DIAGNOSIS — I739 Peripheral vascular disease, unspecified: Secondary | ICD-10-CM

## 2017-02-17 DIAGNOSIS — Z7982 Long term (current) use of aspirin: Secondary | ICD-10-CM | POA: Diagnosis not present

## 2017-02-17 DIAGNOSIS — Z720 Tobacco use: Secondary | ICD-10-CM

## 2017-02-17 DIAGNOSIS — Z79899 Other long term (current) drug therapy: Secondary | ICD-10-CM | POA: Insufficient documentation

## 2017-02-17 DIAGNOSIS — I110 Hypertensive heart disease with heart failure: Secondary | ICD-10-CM | POA: Diagnosis not present

## 2017-02-17 DIAGNOSIS — Z833 Family history of diabetes mellitus: Secondary | ICD-10-CM | POA: Insufficient documentation

## 2017-02-17 MED ORDER — SPIRONOLACTONE 25 MG PO TABS: 12.5000 mg | ORAL_TABLET | Freq: Every day | ORAL | 3 refills | Status: DC

## 2017-02-17 NOTE — Addendum Note (Signed)
Encounter addended by: Noralee Space, RN on: 02/17/2017 10:02 AM<BR>    Actions taken: Visit diagnoses modified, Order list changed, Diagnosis association updated, Sign clinical note

## 2017-02-17 NOTE — Patient Instructions (Signed)
Start Spironolactone 12.5 mg (1/2 tab) daily  Lab in 1 week  Your physician has requested that you have an echocardiogram. Echocardiography is a painless test that uses sound waves to create images of your heart. It provides your doctor with information about the size and shape of your heart and how well your heart's chambers and valves are working. This procedure takes approximately one hour. There are no restrictions for this procedure.  IN 3-4 MONTHS  Your physician wants you to follow-up in: 3-4 You will receive a reminder letter in the mail two months in advance. If you don't receive a letter, please call our office to schedule the follow-up appointment.

## 2017-02-17 NOTE — Progress Notes (Signed)
Patient ID: Sandra Owens, female   DOB: [birth year], 51 y.o.   MRN: [id number] .  Patient ID: Sandra Owens, female   DOB: Jun 14, 1965, 51 y.o.   MRN: [id number] . PCP: Merri Brunette   HPI:  Sandra Owens is a 51 y/o woman with obesity, HTN, DM2, CAD chronic systolic HF due to primarily NICM (diagnosed 7/16).   Admitted 6/17 with NSTEMI and decompensated HF. Echo showed EF of 15-20% with diffuse hypokinesis. Had right and left heart cath on 10/1215 which showed severe biventricular CHF EF less than 20%, severe pulmonary hypertension with PA pressure 73mm Hg. severe 95% mid LAD stenosis, new likely culprit for patient's ACS. Had staged PCI of LAD on 6/14 complicated with transient cardiogenic shock during the interventional part of cardiac cath du to jailing of a septal branch    She presents today for regular follow up. Echo on 9/17 with EF back to 45-50%.  On Entresto 97/103, carvedilol 6.25 bid, spiro 12.5 on Bidil 1 tab tid. Says she is very busy. Doing well. Working, cooking and cleaning without problem. Very fatigued. Falls asleep easily. Not sure if she snores. Had sleep study with [facility] in 2017 and was negative. BP always up. Still smoking a about 5 cigarettes per day. Had ABIs which showed moderately to severely reduced ABI bilaterally with evidence of SFA occlusion bilaterally with reconstitution via collaterals. Follows with Dr. Kirke Corin who is managing medically due to lack of significant claudication. No edema, orthopnea, PND.   Cath 6/17  LAD 90% LCX 70% RCA 40%   RA 20 RV 67/22 PA 76/33 (53) PCWP 44 CO/CI = 3.3/1.7   ROS: All systems negative except as listed in HPI, PMH and Problem List.  SH:  Social History   Social History  . Marital status: Single    Spouse name: N/A  . Number of children: 3  . Years of education: N/A   Occupational History  . Not on file.   Social History Main Topics  . Smoking status: Current Every Day Smoker    Packs/day: 0.25    Years:  30.00    Types: Cigarettes    Last attempt to quit: 10/12/2015  . Smokeless tobacco: Never Used  . Alcohol use No  . Drug use: No  . Sexual activity: Not on file   Other Topics Concern  . 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         FH:  Family History  Problem Relation Age of Onset  . Hypertension Father   . Diabetes Mellitus II Father   . CAD Father        CABG in his 29s  . Hypertension Mother   . Diabetes Mellitus II Mother   . Stroke Paternal Grandmother     Past Medical History:  Diagnosis Date  . Coronary artery disease   . Essential hypertension   . GERD (gastroesophageal reflux disease)   . Hyperlipidemia   . Low back pain   . Systolic CHF (HCC)    a. Found to have new low EF 11/2014 with abnormal nuc.  . Type 2 diabetes mellitus (HCC)     Current Outpatient Prescriptions  Medication Sig Dispense Refill  . aspirin EC 81 MG tablet Take 1 tablet (81 mg total) by mouth daily. 90 tablet 3  . atorvastatin (LIPITOR) 80 MG tablet Take 1 tablet (80 mg total) by mouth daily at 6 PM. Needs OV for  further refills 30 tablet 0  . carvedilol (COREG) 6.25 MG tablet Take 1 tablet (6.25 mg total) by mouth 2 (two) times daily. 180 tablet 3  . ENTRESTO 97-103 MG TAKE ONE TABLET BY MOUTH TWICE DAILY 60 tablet 3  . famotidine (PEPCID AC) 10 MG chewable tablet Chew 10 mg by mouth daily as needed for heartburn.    Marland Kitchen glucose blood test strip 1 each by Other route See admin instructions. Check blood sugar once daily    . isosorbide-hydrALAZINE (BIDIL) 20-37.5 MG tablet Take 1 tablet by mouth 3 (three) times daily. 90 tablet 6  . nicotine (NICODERM CQ - DOSED IN MG/24 HOURS) 14 mg/24hr patch Place 1 patch (14 mg total) onto the skin daily. 28 patch 3  . nitroGLYCERIN (NITROSTAT) 0.4 MG SL tablet Place 1 tablet (0.4 mg total) under the tongue every 5 (five) minutes as needed for chest pain. 30 tablet 12  . sitaGLIPtin (JANUVIA) 100  MG tablet Take 100 mg by mouth daily.    . ticagrelor (BRILINTA) 90 MG TABS tablet Take 1 tablet (90 mg total) by mouth 2 (two) times daily. 60 tablet 3  . tobramycin (TOBREX) 0.3 % ophthalmic solution Place 1-2 drops into the left eye as needed (for eye infection).     No current facility-administered medications for this encounter.     Vitals:   02/17/17 0923  BP: (!) 167/90  Pulse: 88  SpO2: 100%  Weight: 188 lb 8 oz (85.5 kg)   Wt Readings from Last 3 Encounters:  02/17/17 188 lb 8 oz (85.5 kg)  11/25/16 185 lb 3.2 oz (84 kg)  04/08/16 170 lb 12.8 oz (77.5 kg)     PHYSICAL EXAM:  General:  Well appearing. No resp difficulty HEENT: normal Neck: supple. no JVD. Carotids 2+ bilat; no bruits. No lymphadenopathy or thryomegaly appreciated. Cor: PMI nondisplaced. Regular rate & rhythm. No rubs, gallops or murmurs. Lungs: clear Abdomen: obesesoft, nontender, nondistended. No hepatosplenomegaly. No bruits or masses. Good bowel sounds. Extremities: no cyanosis, clubbing, rash, edema Neuro: alert & orientedx3, cranial nerves grossly intact. moves all 4 extremities w/o difficulty. Affect pleasant  ASSESSMENT & PLAN: 1. Chronic systolic heart failure  - due mostly to NICM. EF 15-20% with biventricular dysfunction in 6/17  - echo 9/17 with EF 45-50%.  - Stable NYHA I Volume status looks good  - continue Entresto, carvedilol and Bidil. Restart spiro 12.5.  - BMET 1 week - Repeat echo 6 months.  2. CAD s/p NSTEMI 6/17   -- s/p DES to LAD 6/17   --continue ASA, statin, b-blocker and Brilinta   --PCP to follow lipids. Goal LDL < 70.  3. Tobacco abuse   - Encouraged her to quit 5. DM2 - Consider Jardiance 6. PAD with claudication   --ABI suggested of bilateral SFA occlusion. Follows with Dr. Kirke Corin. Symptoms much improved with exercise and cutting back on cigarettes 7. HTN   --BP up. Will add spiro 12.5 daily. If BP still up can increase Bidil.   Bensimhon, Daniel,MD 9:48  AM

## 2017-02-24 ENCOUNTER — Ambulatory Visit (HOSPITAL_COMMUNITY)
Admission: RE | Admit: 2017-02-24 | Discharge: 2017-02-24 | Disposition: A | Discharge status: Home / Self Care | Payer: Commercial Managed Care - HMO | Source: Ambulatory Visit | Attending: Cardiology | Admitting: Cardiology

## 2017-02-24 ENCOUNTER — Other Ambulatory Visit (HOSPITAL_COMMUNITY): Payer: Self-pay | Admitting: Pharmacist

## 2017-02-24 DIAGNOSIS — I5022 Chronic systolic (congestive) heart failure: Secondary | ICD-10-CM | POA: Diagnosis not present

## 2017-02-24 LAB — BASIC METABOLIC PANEL
Anion gap: 8 (ref 5–15)
BUN: 12 mg/dL (ref 6–20)
CHLORIDE: 109 mmol/L (ref 101–111)
CO2: 22 mmol/L (ref 22–32)
CREATININE: 1.05 mg/dL — AB (ref 0.44–1.00)
Calcium: 9.1 mg/dL (ref 8.9–10.3)
GFR calc non Af Amer: 60 mL/min — ABNORMAL LOW (ref >60)
GLUCOSE: 158 mg/dL — AB (ref 65–99)
Potassium: 3.6 mmol/L (ref 3.5–5.1)
Sodium: 139 mmol/L (ref 135–145)

## 2017-02-24 MED ORDER — CARVEDILOL 6.25 MG PO TABS: 6.2500 mg | ORAL_TABLET | Freq: Two times a day (BID) | ORAL | 3 refills | Status: DC

## 2017-02-24 MED ORDER — TICAGRELOR 90 MG PO TABS: 90.0000 mg | ORAL_TABLET | Freq: Two times a day (BID) | ORAL | 3 refills | Status: DC

## 2017-02-24 MED ORDER — SACUBITRIL-VALSARTAN 97-103 MG PO TABS: 1.0000 | ORAL_TABLET | Freq: Two times a day (BID) | ORAL | 3 refills | Status: DC

## 2017-02-24 MED ORDER — ISOSORB DINITRATE-HYDRALAZINE 20-37.5 MG PO TABS: 1.0000 | ORAL_TABLET | Freq: Three times a day (TID) | ORAL | 3 refills | Status: DC

## 2017-02-24 MED ORDER — SPIRONOLACTONE 25 MG PO TABS: 12.5000 mg | ORAL_TABLET | Freq: Every day | ORAL | 3 refills | Status: DC

## 2017-02-24 MED ORDER — ATORVASTATIN CALCIUM 80 MG PO TABS: 80.0000 mg | ORAL_TABLET | Freq: Every day | ORAL | 3 refills | Status: DC

## 2017-04-16 DIAGNOSIS — E785 Hyperlipidemia, unspecified: Secondary | ICD-10-CM | POA: Diagnosis not present

## 2017-04-16 DIAGNOSIS — I10 Essential (primary) hypertension: Secondary | ICD-10-CM | POA: Diagnosis not present

## 2017-04-16 DIAGNOSIS — E119 Type 2 diabetes mellitus without complications: Secondary | ICD-10-CM | POA: Diagnosis not present

## 2017-04-16 DIAGNOSIS — Z23 Encounter for immunization: Secondary | ICD-10-CM | POA: Diagnosis not present

## 2017-10-15 DIAGNOSIS — I5020 Unspecified systolic (congestive) heart failure: Secondary | ICD-10-CM | POA: Diagnosis not present

## 2017-10-15 DIAGNOSIS — I10 Essential (primary) hypertension: Secondary | ICD-10-CM | POA: Diagnosis not present

## 2017-10-15 DIAGNOSIS — Z7984 Long term (current) use of oral hypoglycemic drugs: Secondary | ICD-10-CM | POA: Diagnosis not present

## 2017-10-15 DIAGNOSIS — E1169 Type 2 diabetes mellitus with other specified complication: Secondary | ICD-10-CM | POA: Diagnosis not present

## 2017-12-15 ENCOUNTER — Telehealth (HOSPITAL_COMMUNITY): Payer: Self-pay | Admitting: Internal Medicine

## 2017-12-15 ENCOUNTER — Encounter: Payer: Self-pay | Admitting: Cardiovascular Disease

## 2017-12-15 ENCOUNTER — Ambulatory Visit: Payer: 59 | Admitting: Cardiovascular Disease

## 2017-12-15 VITALS — BP 124/70 | HR 107 | Ht 64.0 in | Wt 187.2 lb

## 2017-12-15 DIAGNOSIS — E785 Hyperlipidemia, unspecified: Secondary | ICD-10-CM

## 2017-12-15 DIAGNOSIS — I739 Peripheral vascular disease, unspecified: Secondary | ICD-10-CM | POA: Diagnosis not present

## 2017-12-15 DIAGNOSIS — I5022 Chronic systolic (congestive) heart failure: Secondary | ICD-10-CM

## 2017-12-15 DIAGNOSIS — I2510 Atherosclerotic heart disease of native coronary artery without angina pectoris: Secondary | ICD-10-CM | POA: Diagnosis not present

## 2017-12-15 DIAGNOSIS — Z720 Tobacco use: Secondary | ICD-10-CM

## 2017-12-15 MED ORDER — TICAGRELOR 60 MG PO TABS: 60.0000 mg | ORAL_TABLET | Freq: Two times a day (BID) | ORAL | 3 refills | Status: DC

## 2017-12-15 MED ORDER — NICOTINE 14 MG/24HR TD PT24: 14.0000 mg | MEDICATED_PATCH | Freq: Every day | TRANSDERMAL | 2 refills | Status: DC

## 2017-12-15 NOTE — Patient Instructions (Addendum)
Medication Instructions: DECREASE the Brilinta to 60 twice daily  If you need a refill on your cardiac medications before your next appointment, please call your pharmacy.   Procedures/Testing: Your physician has requested that you have an echocardiogram. Echocardiography is a painless test that uses sound waves to create images of your heart. It provides your doctor with information about the size and shape of your heart and how well your heart's chambers and valves are working.  Follow-Up: Your physician wants you to follow-up in 1 month with Dr. Gala Romney and 12 months with Dr. Kirke Corin.    Thank you for choosing [facility]!!

## 2017-12-15 NOTE — Telephone Encounter (Signed)
I received VM from Gavin Pound @[facility]  NL office stating pt saw Dr. Kirke Corin today and needs to f/u with Dr. Gala Romney in a month.  According to patient's chart she was due for f/u with Dr. Gala Romney and Echo in February, 2019.  Called pt and left message for her to call our office, will schedule pt for next available Echo and f/u appt with Dr. Gala Romney.

## 2017-12-15 NOTE — Progress Notes (Signed)
Cardiology Office Note   Date:  12/15/2017   ID:  Sandra Owens, DOB 18-Sep-1965, MRN [id number]  PCP:  Merri Brunette, MD  Cardiologist: Dr. Gala Romney  No chief complaint on file.     History of Present Illness: Sandra Owens is a 52 y.o. female who is here today for a follow up visit regarding  peripheral arterial disease. She has known history of chronic systolic heart failure and coronary artery disease. Ejection fraction initially in June, 2017 was 15-20%. She had LAD PCI. Ejection fraction improved to 45% on most recent evaluation.  She is followed for bilateral calf claudication due to SFA occlusion bilaterally.  She has been treated medically given that her symptoms improved with medical therapy. She has been doing reasonably well with no chest pain.  She reports stable exertional dyspnea.  She was supposed to get an echocardiogram last year as ordered by Dr. Gala Romney but that has not been done. She reports that he she had labs done in June with her primary care physician. She continues to describe mild bilateral calf claudication which happens only with overexertion.  She does not feel significantly limited by this. Unfortunately, she continues to smoke about 5 cigarettes a day.  Past Medical History:  Diagnosis Date  . Coronary artery disease   . Essential hypertension   . GERD (gastroesophageal reflux disease)   . Hyperlipidemia   . Low back pain   . Systolic CHF (HCC)    a. Found to have new low EF 11/2014 with abnormal nuc.  . Type 2 diabetes mellitus (HCC)     Past Surgical History:  Procedure Laterality Date  . CARDIAC CATHETERIZATION N/A 11/23/2014   Procedure: Right/Left Heart Cath and Coronary Angiography;  Surgeon: Corky Crafts, MD;  Location: [facility] INVASIVE CV LAB;  Service: Cardiovascular;  Laterality: N/A;  . CARDIAC CATHETERIZATION N/A 10/15/2015   Procedure: Right/Left Heart Cath and Coronary Angiography;  Surgeon: Lyn Records, MD;   Location: [facility] INVASIVE CV LAB;  Service: Cardiovascular;  Laterality: N/A;  . CARDIAC CATHETERIZATION N/A 10/17/2015   Procedure: Coronary Stent Intervention;  Surgeon: Lyn Records, MD;  Location: [facility] INVASIVE CV LAB;  Service: Cardiovascular;  Laterality: N/A;  . CORONARY ANGIOPLASTY    . TUBAL LIGATION       Current Outpatient Medications  Medication Sig Dispense Refill  . aspirin EC 81 MG tablet Take 1 tablet (81 mg total) by mouth daily. 90 tablet 3  . atorvastatin (LIPITOR) 80 MG tablet Take 1 tablet (80 mg total) by mouth daily at 6 PM. Needs OV for further refills 90 tablet 3  . carvedilol (COREG) 6.25 MG tablet Take 1 tablet (6.25 mg total) by mouth 2 (two) times daily. 180 tablet 3  . famotidine (PEPCID AC) 10 MG chewable tablet Chew 10 mg by mouth daily as needed for heartburn.    Marland Kitchen glucose blood test strip 1 each by Other route See admin instructions. Check blood sugar once daily    . isosorbide-hydrALAZINE (BIDIL) 20-37.5 MG tablet Take 1 tablet by mouth 3 (three) times daily. 270 tablet 3  . nitroGLYCERIN (NITROSTAT) 0.4 MG SL tablet Place 1 tablet (0.4 mg total) under the tongue every 5 (five) minutes as needed for chest pain. 30 tablet 12  . sacubitril-valsartan (ENTRESTO) 97-103 MG Take 1 tablet by mouth 2 (two) times daily. 180 tablet 3  . sitaGLIPtin (JANUVIA) 100 MG tablet Take 100 mg by mouth daily.    Marland Kitchen spironolactone (ALDACTONE) 25 MG  tablet Take 0.5 tablets (12.5 mg total) by mouth daily. 45 tablet 3  . ticagrelor (BRILINTA) 90 MG TABS tablet Take 1 tablet (90 mg total) by mouth 2 (two) times daily. 180 tablet 3  . tobramycin (TOBREX) 0.3 % ophthalmic solution Place 1-2 drops into the left eye as needed (for eye infection).     No current facility-administered medications for this visit.     Allergies:   Patient has no known allergies.    Social History:  The patient  reports that she has been smoking cigarettes. She has a 7.50 pack-year smoking history. She has  never used smokeless tobacco. She reports that she does not drink alcohol or use drugs.   Family History:  The patient's family history includes CAD in her father; Diabetes Mellitus II in her father and mother; Hypertension in her father and mother; Stroke in her paternal grandmother.    ROS:  Please see the history of present illness.   Otherwise, review of systems are positive for none.   All other systems are reviewed and negative.    PHYSICAL EXAM: VS:  BP 124/70   Pulse (!) 107   Ht 5\' 4"  (1.626 m)   Wt 187 lb 3.2 oz (84.9 kg)   BMI 32.13 kg/m  , BMI Body mass index is 32.13 kg/m. GEN: Well nourished, well developed, in no acute distress  HEENT: normal  Neck: no JVD, carotid bruits, or masses Cardiac: RRR; no murmurs, rubs, or gallops,no edema  Respiratory:  clear to auscultation bilaterally, normal work of breathing GI: soft, nontender, nondistended, + BS MS: no deformity or atrophy  Skin: warm and dry, no rash Neuro:  Strength and sensation are intact Psych: euthymic mood, full affect Vascular: Distal pulses are not palpable.   EKG:  EKG is ordered today. EKG showed sinus tachycardia with no significant ST or T wave changes.   Recent Labs: 02/24/2017: BUN 12; Creatinine, Ser 1.05; Potassium 3.6; Sodium 139    Lipid Panel    Component Value Date/Time   CHOL 199 10/14/2015 0417   TRIG 122 10/14/2015 0417   HDL 38 (L) 10/14/2015 0417   CHOLHDL 5.2 10/14/2015 0417   VLDL 24 10/14/2015 0417   LDLCALC 137 (H) 10/14/2015 0417      Wt Readings from Last 3 Encounters:  12/15/17 187 lb 3.2 oz (84.9 kg)  02/17/17 188 lb 8 oz (85.5 kg)  11/25/16 185 lb 3.2 oz (84 kg)        ASSESSMENT AND PLAN:  1.  Peripheral arterial disease with  bilateral leg claudication due to bilateral SFA occlusion: Moderate bilateral claudication which is currently not lifestyle limiting.  Continue medical therapy.    2. Coronary artery disease involving native coronary arteries  without angina:  She is doing well with no anginal symptoms.  It has been more than 2 years since drug-eluting stent placement.  Thus, I decreased Brilinta to 60 mg twice daily.  3. Chronic systolic heart failure: She appears to be euvolemic and currently on optimal medical therapy.  She was supposed to get a follow-up echocardiogram to evaluate her EF.  I reordered this and will have her see Dr. Gala Romney in 1 month.  4. Tobacco use:  Unfortunately, she continues to smoke. I had a prolonged discussion with her again about the importance of smoking cessation.  She wants to try to quit with a nicotine patch and this was refilled.  5. Hyperlipidemia: Continue treatment with atorvastatin with a target LDL of  less than 70.    Disposition:   FU with me in 12 months  Signed,  Lorine Bears, MD  12/15/2017 11:22 AM    [facility]

## 2017-12-17 NOTE — Addendum Note (Signed)
Addended by: Barrie Dunker on: 12/17/2017 10:24 AM   Modules accepted: Orders

## 2017-12-21 ENCOUNTER — Ambulatory Visit (HOSPITAL_COMMUNITY): Payer: 59 | Attending: Cardiology

## 2017-12-21 ENCOUNTER — Other Ambulatory Visit: Payer: Self-pay

## 2017-12-21 DIAGNOSIS — I5022 Chronic systolic (congestive) heart failure: Secondary | ICD-10-CM | POA: Diagnosis not present

## 2017-12-25 ENCOUNTER — Telehealth: Payer: Self-pay | Admitting: *Deleted

## 2017-12-25 NOTE — Telephone Encounter (Signed)
Patient made aware of results and verbalized understanding.  

## 2017-12-25 NOTE — Telephone Encounter (Signed)
Pt returning our call please call back  °

## 2017-12-25 NOTE — Telephone Encounter (Signed)
Left a message to call back.

## 2017-12-25 NOTE — Telephone Encounter (Signed)
-----   Message from Iran Ouch, MD sent at 12/24/2017  7:49 AM EDT ----- Inform patient that echo showed improvement in EF to normal which is great news.

## 2018-01-14 ENCOUNTER — Telehealth (HOSPITAL_COMMUNITY): Payer: Self-pay | Admitting: Vascular Surgery

## 2018-01-14 NOTE — Telephone Encounter (Signed)
Left pt message to move appt 10/2 @ 3 to 10/3 @ 2:40 asked pt to call back to confirm appt

## 2018-01-22 DIAGNOSIS — E1169 Type 2 diabetes mellitus with other specified complication: Secondary | ICD-10-CM | POA: Diagnosis not present

## 2018-02-03 ENCOUNTER — Encounter (HOSPITAL_COMMUNITY): Payer: 59 | Admitting: Internal Medicine

## 2018-02-04 ENCOUNTER — Ambulatory Visit (HOSPITAL_COMMUNITY)
Admission: RE | Admit: 2018-02-04 | Discharge: 2018-02-04 | Disposition: A | Discharge status: Home / Self Care | Payer: 59 | Source: Ambulatory Visit | Attending: Internal Medicine | Admitting: Internal Medicine

## 2018-02-04 VITALS — BP 124/76 | HR 97 | Wt 189.0 lb

## 2018-02-04 DIAGNOSIS — I2510 Atherosclerotic heart disease of native coronary artery without angina pectoris: Secondary | ICD-10-CM | POA: Diagnosis not present

## 2018-02-04 DIAGNOSIS — Z794 Long term (current) use of insulin: Secondary | ICD-10-CM | POA: Insufficient documentation

## 2018-02-04 DIAGNOSIS — Z79899 Other long term (current) drug therapy: Secondary | ICD-10-CM | POA: Insufficient documentation

## 2018-02-04 DIAGNOSIS — E119 Type 2 diabetes mellitus without complications: Secondary | ICD-10-CM | POA: Diagnosis not present

## 2018-02-04 DIAGNOSIS — I739 Peripheral vascular disease, unspecified: Secondary | ICD-10-CM | POA: Diagnosis not present

## 2018-02-04 DIAGNOSIS — I428 Other cardiomyopathies: Secondary | ICD-10-CM | POA: Insufficient documentation

## 2018-02-04 DIAGNOSIS — K219 Gastro-esophageal reflux disease without esophagitis: Secondary | ICD-10-CM | POA: Diagnosis not present

## 2018-02-04 DIAGNOSIS — I10 Essential (primary) hypertension: Secondary | ICD-10-CM | POA: Diagnosis not present

## 2018-02-04 DIAGNOSIS — E669 Obesity, unspecified: Secondary | ICD-10-CM | POA: Diagnosis not present

## 2018-02-04 DIAGNOSIS — I110 Hypertensive heart disease with heart failure: Secondary | ICD-10-CM | POA: Insufficient documentation

## 2018-02-04 DIAGNOSIS — Z720 Tobacco use: Secondary | ICD-10-CM | POA: Diagnosis not present

## 2018-02-04 DIAGNOSIS — F17210 Nicotine dependence, cigarettes, uncomplicated: Secondary | ICD-10-CM | POA: Insufficient documentation

## 2018-02-04 DIAGNOSIS — I252 Old myocardial infarction: Secondary | ICD-10-CM | POA: Insufficient documentation

## 2018-02-04 DIAGNOSIS — I5022 Chronic systolic (congestive) heart failure: Secondary | ICD-10-CM

## 2018-02-04 DIAGNOSIS — Z955 Presence of coronary angioplasty implant and graft: Secondary | ICD-10-CM | POA: Insufficient documentation

## 2018-02-04 DIAGNOSIS — E785 Hyperlipidemia, unspecified: Secondary | ICD-10-CM | POA: Insufficient documentation

## 2018-02-04 DIAGNOSIS — Z7982 Long term (current) use of aspirin: Secondary | ICD-10-CM | POA: Insufficient documentation

## 2018-02-04 MED ORDER — CARVEDILOL 12.5 MG PO TABS: 12.5000 mg | ORAL_TABLET | Freq: Two times a day (BID) | ORAL | 3 refills | Status: DC

## 2018-02-04 NOTE — Progress Notes (Signed)
Patient ID: Sandra Owens, female   DOB: 1965-11-19, 52 y.o.   MRN: [id number] .  Patient ID: Sandra Owens, female   DOB: 1966/04/23, 52 y.o.   MRN: [id number] . PCP: Merri Brunette   HPI:  Sandra Owens is a 52 y.o. female woman with obesity, HTN, DM2, CAD chronic systolic HF due to primarily NICM (diagnosed 7/16).   Admitted 6/17 with NSTEMI and decompensated HF. Echo showed EF of 15-20% with diffuse hypokinesis. Had right and left heart cath on 10/1215 which showed severe biventricular CHF EF less than 20%, severe pulmonary hypertension with PA pressure 70mm Hg. severe 95% mid LAD stenosis, new likely culprit for patient's ACS. Had staged PCI of LAD on 6/14 complicated with transient cardiogenic shock during the interventional part of cardiac cath du to jailing of a septal branch   She presents today for regular follow up. Last visit spiro was added. Overall doing well. She continues to work and has gone on some relaxing vacations over the last year. She lost her father last month. No SOB. Stays active, walks 2x/week. No edema, orthopnea, or PND. No CP or dizziness. Still has pain with walking, but follows with Dr Kirke Corin. Occasionally has a cigarette, a few times/day. Weights fluctuate at home: 178-188 lbs, depending on what she eats. Limits salt intake. She recently started ozempic in July and has been drinking more fluids with that. Dr Katrinka Blazing manages her diabetes.  Cath 6/17  LAD 90% LCX 70% RCA 40%  RA 20 RV 67/22 PA 76/33 (53) PCWP 44 CO/CI = 3.3/1.7  Review of systems complete and found to be negative unless listed in HPI.   SH:  Social History   Socioeconomic History  . Marital status: Single    Spouse name: Not on file  . Number of children: 3  . Years of education: Not on file  . Highest education level: Not on file  Occupational History  . Not on file  Social Needs  . Financial resource strain: Not on file  . Food insecurity:    Worry: Not on file   Inability: Not on file  . Transportation needs:    Medical: Not on file    Non-medical: Not on file  Tobacco Use  . Smoking status: Current Every Day Smoker    Packs/day: 0.25    Years: 30.00    Pack years: 7.50    Types: Cigarettes    Last attempt to quit: 10/12/2015    Years since quitting: 2.3  . Smokeless tobacco: Never Used  Substance and Sexual Activity  . Alcohol use: No    Alcohol/week: 0.0 standard drinks  . Drug use: No  . Sexual activity: Not on file  Lifestyle  . Physical activity:    Days per week: Not on file    Minutes per session: Not on file  . Stress: Not on file  Relationships  . Social connections:    Talks on phone: Not on file    Gets together: Not on file    Attends religious service: Not on file    Active member of club or organization: Not on file    Attends meetings of clubs or organizations: Not on file    Relationship status: Not on file  . Intimate partner violence:    Fear of current or ex partner: Not on file    Emotionally abused: Not on file    Physically abused: Not on file    Forced sexual activity: Not on file  Other  Topics Concern  . 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      FH:  Family History  Problem Relation Age of Onset  . Hypertension Father   . Diabetes Mellitus II Father   . CAD Father        CABG in his 14s  . Hypertension Mother   . Diabetes Mellitus II Mother   . Stroke Paternal Grandmother     Past Medical History:  Diagnosis Date  . Coronary artery disease   . Essential hypertension   . GERD (gastroesophageal reflux disease)   . Hyperlipidemia   . Low back pain   . Systolic CHF (HCC)    a. Found to have new low EF 11/2014 with abnormal nuc.  . Type 2 diabetes mellitus (HCC)     Current Outpatient Medications  Medication Sig Dispense Refill  . aspirin EC 81 MG tablet Take 1 tablet (81 mg total) by mouth daily. 90 tablet 3  . atorvastatin (LIPITOR) 80  MG tablet Take 1 tablet (80 mg total) by mouth daily at 6 PM. Needs OV for further refills 90 tablet 3  . carvedilol (COREG) 6.25 MG tablet Take 1 tablet (6.25 mg total) by mouth 2 (two) times daily. 180 tablet 3  . famotidine (PEPCID AC) 10 MG chewable tablet Chew 10 mg by mouth daily as needed for heartburn.    Marland Kitchen glucose blood test strip 1 each by Other route See admin instructions. Check blood sugar once daily    . isosorbide-hydrALAZINE (BIDIL) 20-37.5 MG tablet Take 1 tablet by mouth 3 (three) times daily. 270 tablet 3  . nicotine (NICODERM CQ - DOSED IN MG/24 HOURS) 14 mg/24hr patch Place 1 patch (14 mg total) onto the skin daily. 28 patch 2  . nitroGLYCERIN (NITROSTAT) 0.4 MG SL tablet Place 1 tablet (0.4 mg total) under the tongue every 5 (five) minutes as needed for chest pain. 30 tablet 12  . sacubitril-valsartan (ENTRESTO) 97-103 MG Take 1 tablet by mouth 2 (two) times daily. 180 tablet 3  . Semaglutide (OZEMPIC, 0.25 OR 0.5 MG/DOSE, [place]) Inject into the skin.    Marland Kitchen spironolactone (ALDACTONE) 25 MG tablet Take 0.5 tablets (12.5 mg total) by mouth daily. 45 tablet 3  . ticagrelor (BRILINTA) 60 MG TABS tablet Take 1 tablet (60 mg total) by mouth 2 (two) times daily. 180 tablet 3  . tobramycin (TOBREX) 0.3 % ophthalmic solution Place 1-2 drops into the left eye as needed (for eye infection).     No current facility-administered medications for this encounter.     Vitals:   02/04/18 1434  BP: 124/76  Pulse: 97  SpO2: 98%  Weight: 85.7 kg (189 lb)   Wt Readings from Last 3 Encounters:  02/04/18 85.7 kg (189 lb)  12/15/17 84.9 kg (187 lb 3.2 oz)  02/17/17 85.5 kg (188 lb 8 oz)     PHYSICAL EXAM:  General: Well appearing. No resp difficulty. HEENT: Normal. Anicteric  Neck: Supple. JVP 5-6. Carotids 2+ bilat; no bruits. No thyromegaly or nodule noted. Cor: PMI nondisplaced. RRR, No M/G/R noted Lungs: diminished, clear throughout No wheeze.  Abdomen: Obese. Soft, non-tender,  non-distended, no HSM. No bruits or masses. +BS  Extremities: No cyanosis, clubbing, or rash. R and LLE no edema.  Neuro: Alert & orientedx3, cranial nerves grossly intact. moves all 4 extremities w/o difficulty. Affect pleasant   ASSESSMENT & PLAN: 1. Chronic systolic heart failure  -  due mostly to NICM. EF 15-20% with biventricular dysfunction in 6/17  - echo 9/17 with EF 45-50%.  - Echo 8/19: EF 60-65%, grade 1 DD, trivial TR - Stable NYHA I Volume status looks good  - Continue Entresto 97/103, spiro 12.5 mg daily and Bidil 1 tab TID. - Increase carvedilol to 12.5 bid 2. CAD s/p NSTEMI 6/17   -- s/p DES to LAD 6/17   --continue ASA, statin, b-blocker and Brilinta   --PCP to follow lipids. Goal LDL < 70.  - No s/s ischemia.  3. Tobacco abuse   - Encouraged her to quit. No change.  5. DM2 - Doesn't want Jardiance due to risk of Fourniers gangrene 6. PAD with claudication   - ABI suggested of bilateral SFA occlusion. Follows with Dr. Kirke Corin. Symptoms much improved with exercise and cutting back on cigarettes. No change.  7. HTN   - Stable today.   Alford Highland , NP 2:52 PM  Patient seen and examined with the above-signed Advanced Practice Provider and/or Housestaff. I personally reviewed laboratory data, imaging studies and relevant notes. I independently examined the patient and formulated the important aspects of the plan. I have edited the note to reflect any of my changes or salient points. I have personally discussed the plan with the patient and/or family.  Doing well. No CP or SOB. EF recovered on echo. Volume status looks good. NYHA I. HR remains high. Will increase carvedilol. We discussed potential benefits of Jardiance but she is not interested due to risk of Fournier's gangrene. Emphasized need for smoking cessation.   F/u 1 year.   Arvilla Meres, MD  3:04 PM

## 2018-02-04 NOTE — Patient Instructions (Addendum)
Medication Instructions:  Your physician has recommended you make the following change in your medication:   INCREASE: carvedilol to 12.5 mg twice a day  Labwork: None ordered  Testing/Procedures: None ordered  Follow-Up: Your physician wants you to follow-up in: 1 year with Dr. Gala Romney. Call us to scheduole your appointment 1 month in advance.  Any Other Special Instructions Will Be Listed Below (If Applicable).     If you need a refill on your cardiac medications before your next appointment, please call your pharmacy.

## 2018-02-04 NOTE — Addendum Note (Signed)
Encounter addended by: Lattie Haw, RN on: 02/04/2018 3:26 PM  Actions taken: Order list changed, Sign clinical note

## 2018-02-04 NOTE — Addendum Note (Signed)
Encounter addended by: Lattie Haw, RN on: 02/04/2018 3:07 PM  Actions taken: Sign clinical note

## 2018-03-18 ENCOUNTER — Other Ambulatory Visit (HOSPITAL_COMMUNITY): Payer: Self-pay | Admitting: Internal Medicine

## 2018-03-24 ENCOUNTER — Other Ambulatory Visit (HOSPITAL_COMMUNITY): Payer: Self-pay | Admitting: Internal Medicine

## 2018-03-24 MED ORDER — TICAGRELOR 60 MG PO TABS: 60.0000 mg | ORAL_TABLET | Freq: Two times a day (BID) | ORAL | 3 refills | Status: DC

## 2018-03-24 MED ORDER — CARVEDILOL 12.5 MG PO TABS: 12.5000 mg | ORAL_TABLET | Freq: Two times a day (BID) | ORAL | 3 refills | Status: DC

## 2018-03-29 ENCOUNTER — Other Ambulatory Visit (HOSPITAL_COMMUNITY): Payer: Self-pay | Admitting: Internal Medicine

## 2018-05-03 ENCOUNTER — Other Ambulatory Visit (HOSPITAL_COMMUNITY): Payer: Self-pay | Admitting: Internal Medicine

## 2018-05-03 DIAGNOSIS — E1169 Type 2 diabetes mellitus with other specified complication: Secondary | ICD-10-CM | POA: Diagnosis not present

## 2018-10-02 ENCOUNTER — Other Ambulatory Visit (HOSPITAL_COMMUNITY): Payer: Self-pay | Admitting: Internal Medicine

## 2018-11-04 ENCOUNTER — Other Ambulatory Visit (HOSPITAL_COMMUNITY): Payer: Self-pay | Admitting: Internal Medicine

## 2018-12-17 ENCOUNTER — Telehealth (HOSPITAL_COMMUNITY): Payer: Self-pay | Admitting: Vascular Surgery

## 2018-12-17 NOTE — Telephone Encounter (Signed)
Left pt message to move 8/26 appt , due to DB not bein gin office asked pt to call back to resh appt

## 2018-12-29 ENCOUNTER — Encounter (HOSPITAL_COMMUNITY): Payer: 59 | Admitting: Internal Medicine

## 2019-01-07 ENCOUNTER — Encounter: Payer: Self-pay | Admitting: Cardiovascular Disease

## 2019-01-11 ENCOUNTER — Encounter: Payer: Self-pay | Admitting: Cardiovascular Disease

## 2019-01-11 ENCOUNTER — Ambulatory Visit: Payer: 59 | Admitting: Cardiovascular Disease

## 2019-01-11 ENCOUNTER — Other Ambulatory Visit: Payer: Self-pay

## 2019-01-11 VITALS — BP 130/70 | HR 101 | Ht 64.0 in | Wt 175.0 lb

## 2019-01-11 DIAGNOSIS — I739 Peripheral vascular disease, unspecified: Secondary | ICD-10-CM | POA: Diagnosis not present

## 2019-01-11 DIAGNOSIS — I10 Essential (primary) hypertension: Secondary | ICD-10-CM | POA: Diagnosis not present

## 2019-01-11 DIAGNOSIS — I2510 Atherosclerotic heart disease of native coronary artery without angina pectoris: Secondary | ICD-10-CM

## 2019-01-11 DIAGNOSIS — E785 Hyperlipidemia, unspecified: Secondary | ICD-10-CM

## 2019-01-11 DIAGNOSIS — R7301 Impaired fasting glucose: Secondary | ICD-10-CM

## 2019-01-11 DIAGNOSIS — Z720 Tobacco use: Secondary | ICD-10-CM

## 2019-01-11 DIAGNOSIS — I420 Dilated cardiomyopathy: Secondary | ICD-10-CM

## 2019-01-11 MED ORDER — NICOTINE 7 MG/24HR TD PT24: 7.0000 mg | MEDICATED_PATCH | Freq: Every day | TRANSDERMAL | 2 refills | Status: DC

## 2019-01-11 MED ORDER — NICOTINE 14 MG/24HR TD PT24: 14.0000 mg | MEDICATED_PATCH | Freq: Every day | TRANSDERMAL | 0 refills | Status: DC

## 2019-01-11 MED ORDER — NITROGLYCERIN 0.4 MG SL SUBL: 0.4000 mg | SUBLINGUAL_TABLET | SUBLINGUAL | 12 refills | Status: DC | PRN

## 2019-01-11 MED ORDER — NITROGLYCERIN 0.4 MG SL SUBL: 0.4000 mg | SUBLINGUAL_TABLET | SUBLINGUAL | 5 refills | Status: DC | PRN

## 2019-01-11 NOTE — Progress Notes (Signed)
Cardiology Office Note   Date:  01/11/2019   ID:  Sandra Owens, DOB 01-25-1966, MRN [id number]  PCP:  Merri Brunette, MD  Cardiologist: Dr. Gala Romney  Chief Complaint  Patient presents with  . Other    12 month follow up. Patient c/o cramps and numbness in legs. Patient denies chest pain and SOB. Meds reviewed verbally with paitent.       History of Present Illness: Sandra Owens is a 53 y.o. female who is here today for a follow up visit regarding  peripheral arterial disease. She has known history of chronic systolic heart failure and coronary artery disease. Ejection fraction initially in June, 2017 was 15-20%. She had LAD PCI.  Most recent echocardiogram in August 2019 showed an EF of 60 to 65%.  She is followed for bilateral calf claudication due to SFA occlusion bilaterally.  She has been treated medically given that her symptoms improved with medical therapy. She has been doing reasonably well overall with no recent chest pain or shortness of breath.  She has minimal bilateral calf discomfort with walking and she is able to do more than before.  She continues to work full-time.  Unfortunately, she continues to smoke and she is requesting help with this.  She has not had any labs done this year.  Past Medical History:  Diagnosis Date  . Coronary artery disease   . Essential hypertension   . GERD (gastroesophageal reflux disease)   . Hyperlipidemia   . Low back pain   . Systolic CHF (HCC)    a. Found to have new low EF 11/2014 with abnormal nuc.  . Type 2 diabetes mellitus (HCC)     Past Surgical History:  Procedure Laterality Date  . CARDIAC CATHETERIZATION N/A 11/23/2014   Procedure: Right/Left Heart Cath and Coronary Angiography;  Surgeon: Corky Crafts, MD;  Location: [facility] INVASIVE CV LAB;  Service: Cardiovascular;  Laterality: N/A;  . CARDIAC CATHETERIZATION N/A 10/15/2015   Procedure: Right/Left Heart Cath and Coronary Angiography;  Surgeon: Lyn Records, MD;  Location: [facility] INVASIVE CV LAB;  Service: Cardiovascular;  Laterality: N/A;  . CARDIAC CATHETERIZATION N/A 10/17/2015   Procedure: Coronary Stent Intervention;  Surgeon: Lyn Records, MD;  Location: [facility] INVASIVE CV LAB;  Service: Cardiovascular;  Laterality: N/A;  . CORONARY ANGIOPLASTY    . TUBAL LIGATION       Current Outpatient Medications  Medication Sig Dispense Refill  . aspirin EC 81 MG tablet Take 1 tablet (81 mg total) by mouth daily. 90 tablet 3  . atorvastatin (LIPITOR) 80 MG tablet TAKE 1 TABLET BY MOUTH ONCE DAILY AT  6  PM 30 tablet 1  . BIDIL 20-37.5 MG tablet TAKE 1 TABLET BY MOUTH THREE TIMES DAILY 90 tablet 11  . carvedilol (COREG) 12.5 MG tablet Take 1 tablet (12.5 mg total) by mouth 2 (two) times daily. 180 tablet 3  . ENTRESTO 97-103 MG TAKE 1 TABLET BY MOUTH TWICE DAILY 60 tablet 11  . famotidine (PEPCID AC) 10 MG chewable tablet Chew 10 mg by mouth daily as needed for heartburn.    Marland Kitchen glucose blood test strip 1 each by Other route See admin instructions. Check blood sugar once daily    . nicotine (NICODERM CQ - DOSED IN MG/24 HOURS) 14 mg/24hr patch Place 1 patch (14 mg total) onto the skin daily. 28 patch 2  . nitroGLYCERIN (NITROSTAT) 0.4 MG SL tablet Place 1 tablet (0.4 mg total) under the tongue every 5 (  five) minutes as needed for chest pain. 30 tablet 12  . Semaglutide (OZEMPIC, 0.25 OR 0.5 MG/DOSE, [place]) Inject into the skin.    Marland Kitchen. spironolactone (ALDACTONE) 25 MG tablet TAKE 1/2 (ONE-HALF) TABLET BY MOUTH ONCE DAILY 15 tablet 11  . ticagrelor (BRILINTA) 60 MG TABS tablet Take 1 tablet (60 mg total) by mouth 2 (two) times daily. 180 tablet 3  . tobramycin (TOBREX) 0.3 % ophthalmic solution Place 1-2 drops into the left eye as needed (for eye infection).     No current facility-administered medications for this visit.     Allergies:   Patient has no known allergies.    Social History:  The patient  reports that she has been smoking cigarettes. She has a  7.50 pack-year smoking history. She has never used smokeless tobacco. She reports that she does not drink alcohol or use drugs.   Family History:  The patient's family history includes CAD in her father; Diabetes Mellitus II in her father and mother; Hypertension in her father and mother; Stroke in her paternal grandmother.    ROS:  Please see the history of present illness.   Otherwise, review of systems are positive for none.   All other systems are reviewed and negative.    PHYSICAL EXAM: VS:  BP 130/70 (BP Location: Left Arm, Patient Position: Sitting, Cuff Size: Normal)   Pulse (!) 101   Ht 5\' 4"  (1.626 m)   Wt 175 lb (79.4 kg)   BMI 30.04 kg/m  , BMI Body mass index is 30.04 kg/m. GEN: Well nourished, well developed, in no acute distress  HEENT: normal  Neck: no JVD, carotid bruits, or masses Cardiac: RRR; no murmurs, rubs, or gallops,no edema  Respiratory:  clear to auscultation bilaterally, normal work of breathing GI: soft, nontender, nondistended, + BS MS: no deformity or atrophy  Skin: warm and dry, no rash Neuro:  Strength and sensation are intact Psych: euthymic mood, full affect Vascular: Distal pulses are not palpable.   EKG:  EKG is ordered today. EKG showed sinus tachycardia with no significant ST or T wave changes.  Heart rate is 100 01.  Recent Labs: No results found for requested labs within last 8760 hours.    Lipid Panel    Component Value Date/Time   CHOL 199 10/14/2015 0417   TRIG 122 10/14/2015 0417   HDL 38 (L) 10/14/2015 0417   CHOLHDL 5.2 10/14/2015 0417   VLDL 24 10/14/2015 0417   LDLCALC 137 (H) 10/14/2015 0417      Wt Readings from Last 3 Encounters:  01/11/19 175 lb (79.4 kg)  02/04/18 189 lb (85.7 kg)  12/15/17 187 lb 3.2 oz (84.9 kg)        ASSESSMENT AND PLAN:  1.  Peripheral arterial disease with minimal bilateral leg claudication due to bilateral SFA occlusion:    Continue medical therapy.  I discussed the importance of  a walking program.  2. Coronary artery disease involving native coronary arteries without angina:  She is doing well with no anginal symptoms.  She is on long-term aspirin and low-dose ticagrelor given the presence of peripheral arterial disease in addition to her CAD.  3. Chronic systolic heart failure: She appears to be euvolemic and currently on optimal medical therapy.  Most recent ejection fraction was normal.  She continues to follow with Dr. Gala RomneyBensimhon.  I requested routine labs on her today.  4. Tobacco use: I again discussed the importance of smoking cessation and different methods.  She  wants to try the nicotine patch again.  She knows not to smoke and she is wearing 1.  5. Hyperlipidemia: Continue treatment with atorvastatin with a target LDL of less than 70.  I requested lipid and liver profile.    Disposition:   FU with me in 6 months  Signed,  Kathlyn Sacramento, MD  01/11/2019 11:54 AM    [place]

## 2019-01-11 NOTE — Patient Instructions (Addendum)
Medication Instructions:  MEDICATION REFILLED  -- START NICOTINE 14 MG/24HR PATCH  FOR ONE MONTH  THEN TAKE NICOTINE 7 MG/24 HR PATCH FOR 2 MONTHS    If you need a refill on your cardiac medications before your next appointment, please call your pharmacy.   Lab work: CBC BMP LIPID LIVER If you have labs (blood work) drawn today and your tests are completely normal, you will receive your results only by: Marland Kitchen MyChart Message (if you have MyChart) OR . A paper copy in the mail If you have any lab test that is abnormal or we need to change your treatment, we will call you to review the results.  Testing/Procedures: NOT NEEDED  Follow-Up: At [facility], you and your health needs are our priority.  As part of our continuing mission to provide you with exceptional heart care, we have created designated Provider Care Teams.  These Care Teams include your primary Cardiologist (physician) and Advanced Practice Providers (APPs -  Physician Assistants and Nurse Practitioners) who all work together to provide you with the care you need, when you need it. . You will need a follow up appointment in   6   months - MARCH 2021.  Please call our office 2 months in advance to schedule this appointment.  You may see DR Fletcher Anon.     Any Other Special Instructions Will Be Listed Below (If Applicable).

## 2019-01-12 LAB — BASIC METABOLIC PANEL
BUN/Creatinine Ratio: 14 (ref 9–23)
BUN: 17 mg/dL (ref 6–24)
CO2: 22 mmol/L (ref 20–29)
Calcium: 10.3 mg/dL — ABNORMAL HIGH (ref 8.7–10.2)
Chloride: 106 mmol/L (ref 96–106)
Creatinine, Ser: 1.22 mg/dL — ABNORMAL HIGH (ref 0.57–1.00)
GFR calc Af Amer: 58 mL/min/{1.73_m2} — ABNORMAL LOW (ref >59)
GFR calc non Af Amer: 50 mL/min/{1.73_m2} — ABNORMAL LOW (ref >59)
Glucose: 128 mg/dL — ABNORMAL HIGH (ref 65–99)
Potassium: 5.4 mmol/L — ABNORMAL HIGH (ref 3.5–5.2)
Sodium: 142 mmol/L (ref 134–144)

## 2019-01-12 LAB — HEMOGLOBIN A1C
Est. average glucose Bld gHb Est-mCnc: 151 mg/dL
Hgb A1c MFr Bld: 6.9 % — ABNORMAL HIGH (ref 4.8–5.6)

## 2019-01-12 LAB — LIPID PANEL
Chol/HDL Ratio: 2.7 ratio (ref 0.0–4.4)
Cholesterol, Total: 114 mg/dL (ref 100–199)
HDL: 43 mg/dL (ref >39)
LDL Chol Calc (NIH): 54 mg/dL (ref 0–99)
Triglycerides: 89 mg/dL (ref 0–149)
VLDL Cholesterol Cal: 17 mg/dL (ref 5–40)

## 2019-01-12 LAB — CBC
Hematocrit: 41.2 % (ref 34.0–46.6)
Hemoglobin: 13.5 g/dL (ref 11.1–15.9)
MCH: 22.7 pg — ABNORMAL LOW (ref 26.6–33.0)
MCHC: 32.8 g/dL (ref 31.5–35.7)
MCV: 69 fL — ABNORMAL LOW (ref 79–97)
Platelets: 311 10*3/uL (ref 150–450)
RBC: 5.94 x10E6/uL — ABNORMAL HIGH (ref 3.77–5.28)
RDW: 17.4 % — ABNORMAL HIGH (ref 11.7–15.4)
WBC: 6 10*3/uL (ref 3.4–10.8)

## 2019-01-12 LAB — HEPATIC FUNCTION PANEL
ALT: 24 IU/L (ref 0–32)
AST: 17 IU/L (ref 0–40)
Albumin: 4.4 g/dL (ref 3.8–4.9)
Alkaline Phosphatase: 58 IU/L (ref 39–117)
Bilirubin Total: 0.3 mg/dL (ref 0.0–1.2)
Bilirubin, Direct: 0.11 mg/dL (ref 0.00–0.40)
Total Protein: 7.4 g/dL (ref 6.0–8.5)

## 2019-01-18 ENCOUNTER — Other Ambulatory Visit (HOSPITAL_COMMUNITY): Payer: Self-pay | Admitting: Internal Medicine

## 2019-02-03 ENCOUNTER — Other Ambulatory Visit: Payer: Self-pay

## 2019-02-03 ENCOUNTER — Ambulatory Visit (HOSPITAL_COMMUNITY)
Admission: RE | Admit: 2019-02-03 | Discharge: 2019-02-03 | Disposition: A | Discharge status: Home / Self Care | Payer: 59 | Source: Ambulatory Visit | Attending: Internal Medicine | Admitting: Internal Medicine

## 2019-02-03 ENCOUNTER — Encounter (HOSPITAL_COMMUNITY): Payer: Self-pay | Admitting: Internal Medicine

## 2019-02-03 VITALS — BP 130/60 | HR 102 | Wt 181.3 lb

## 2019-02-03 DIAGNOSIS — E669 Obesity, unspecified: Secondary | ICD-10-CM | POA: Insufficient documentation

## 2019-02-03 DIAGNOSIS — Z79899 Other long term (current) drug therapy: Secondary | ICD-10-CM | POA: Diagnosis not present

## 2019-02-03 DIAGNOSIS — Z833 Family history of diabetes mellitus: Secondary | ICD-10-CM | POA: Diagnosis not present

## 2019-02-03 DIAGNOSIS — I252 Old myocardial infarction: Secondary | ICD-10-CM | POA: Insufficient documentation

## 2019-02-03 DIAGNOSIS — I10 Essential (primary) hypertension: Secondary | ICD-10-CM

## 2019-02-03 DIAGNOSIS — Z8249 Family history of ischemic heart disease and other diseases of the circulatory system: Secondary | ICD-10-CM | POA: Diagnosis not present

## 2019-02-03 DIAGNOSIS — E785 Hyperlipidemia, unspecified: Secondary | ICD-10-CM | POA: Diagnosis not present

## 2019-02-03 DIAGNOSIS — K219 Gastro-esophageal reflux disease without esophagitis: Secondary | ICD-10-CM | POA: Diagnosis not present

## 2019-02-03 DIAGNOSIS — I272 Pulmonary hypertension, unspecified: Secondary | ICD-10-CM | POA: Diagnosis not present

## 2019-02-03 DIAGNOSIS — I2510 Atherosclerotic heart disease of native coronary artery without angina pectoris: Secondary | ICD-10-CM | POA: Diagnosis not present

## 2019-02-03 DIAGNOSIS — I110 Hypertensive heart disease with heart failure: Secondary | ICD-10-CM | POA: Insufficient documentation

## 2019-02-03 DIAGNOSIS — E1151 Type 2 diabetes mellitus with diabetic peripheral angiopathy without gangrene: Secondary | ICD-10-CM | POA: Diagnosis not present

## 2019-02-03 DIAGNOSIS — I428 Other cardiomyopathies: Secondary | ICD-10-CM | POA: Diagnosis not present

## 2019-02-03 DIAGNOSIS — F17210 Nicotine dependence, cigarettes, uncomplicated: Secondary | ICD-10-CM | POA: Diagnosis not present

## 2019-02-03 DIAGNOSIS — Z7982 Long term (current) use of aspirin: Secondary | ICD-10-CM | POA: Diagnosis not present

## 2019-02-03 DIAGNOSIS — I5022 Chronic systolic (congestive) heart failure: Secondary | ICD-10-CM | POA: Insufficient documentation

## 2019-02-03 DIAGNOSIS — Z720 Tobacco use: Secondary | ICD-10-CM

## 2019-02-03 DIAGNOSIS — I739 Peripheral vascular disease, unspecified: Secondary | ICD-10-CM

## 2019-02-03 LAB — BASIC METABOLIC PANEL
Anion gap: 8 (ref 5–15)
BUN: 12 mg/dL (ref 6–20)
CO2: 22 mmol/L (ref 22–32)
Calcium: 9.4 mg/dL (ref 8.9–10.3)
Chloride: 110 mmol/L (ref 98–111)
Creatinine, Ser: 0.93 mg/dL (ref 0.44–1.00)
GFR calc Af Amer: >60 mL/min (ref >60)
GFR calc non Af Amer: >60 mL/min (ref >60)
Glucose, Bld: 129 mg/dL — ABNORMAL HIGH (ref 70–99)
Potassium: 4.1 mmol/L (ref 3.5–5.1)
Sodium: 140 mmol/L (ref 135–145)

## 2019-02-03 MED ORDER — CARVEDILOL 12.5 MG PO TABS: 18.7500 mg | ORAL_TABLET | Freq: Two times a day (BID) | ORAL | 3 refills | Status: DC

## 2019-02-03 NOTE — Patient Instructions (Signed)
INCREASE Carvedilol to 18.75mg  (1.5 tabs) twice a day  EKG done in office today  Labs today We will only contact you if something comes back abnormal or we need to make some changes. Otherwise no news is good news!  Your physician recommends that you schedule a follow-up appointment in: 1 year with Dr Haroldine Laws. Our office will call you to schedule your appointment.   At the [facility], you and your health needs are our priority. As part of our continuing mission to provide you with exceptional heart care, we have created designated Provider Care Teams. These Care Teams include your primary Cardiologist (physician) and Advanced Practice Providers (APPs- Physician Assistants and Nurse Practitioners) who all work together to provide you with the care you need, when you need it.   You may see any of the following providers on your designated Care Team at your next follow up: Marland Kitchen Dr Glori Bickers . Dr Loralie Champagne . Darrick Grinder, NP   Please be sure to bring in all your medications bottles to every appointment.

## 2019-02-03 NOTE — Addendum Note (Signed)
Encounter addended by: Valeda Malm, RN on: 02/03/2019 2:24 PM  Actions taken: Visit diagnoses modified, Order list changed, Diagnosis association updated

## 2019-02-03 NOTE — Progress Notes (Signed)
Patient ID: Sandra Owens, female   DOB: March 09, 1966, 53 y.o.   MRN: [id number] .  Patient ID: Sandra Owens, female   DOB: 1966-03-09, 53 y.o.   MRN: [id number] . PCP: Merri Brunette   HPI:  Sandra Owens is a 53 y.o. female woman with obesity, HTN, DM2, CAD chronic systolic HF due to primarily NICM (diagnosed 7/16).   Admitted 6/17 with NSTEMI and decompensated HF. Echo showed EF of 15-20% with diffuse hypokinesis. Had right and left heart cath on 10/1215 which showed severe biventricular CHF EF less than 20%, severe pulmonary hypertension with PA pressure 61mm Hg. severe 95% mid LAD stenosis, new likely culprit for patient's ACS. Had staged PCI of LAD on 6/14 complicated with transient cardiogenic shock during the interventional part of cardiac cath du to jailing of a septal branch   She presents today for regular follow up. This is yearly f/u. Doing well. Working [employer]. No CP or SOB. Active at work. No orthopnea or PND. Smokes a few cigs/day. Compliant with meds. BP well controlled.    Cath 6/17  LAD 90% LCX 70% RCA 40%  RA 20 RV 67/22 PA 76/33 (53) PCWP 44 CO/CI = 3.3/1.7  Review of systems complete and found to be negative unless listed in HPI.   SH:  Social History   Socioeconomic History  . Marital status: Single    Spouse name: Not on file  . Number of children: 3  . Years of education: Not on file  . Highest education level: Not on file  Occupational History  . Not on file  Social Needs  . Financial resource strain: Not on file  . Food insecurity    Worry: Not on file    Inability: Not on file  . Transportation needs    Medical: Not on file    Non-medical: Not on file  Tobacco Use  . Smoking status: Current Every Day Smoker    Packs/day: 0.25    Years: 30.00    Pack years: 7.50    Types: Cigarettes    Last attempt to quit: 10/12/2015    Years since quitting: 3.3  . Smokeless tobacco: Never Used  Substance and Sexual Activity  . Alcohol use: No     Alcohol/week: 0.0 standard drinks  . Drug use: No  . Sexual activity: Not on file  Lifestyle  . Physical activity    Days per week: Not on file    Minutes per session: Not on file  . Stress: Not on file  Relationships  . Social Musician on phone: Not on file    Gets together: Not on file    Attends religious service: Not on file    Active member of club or organization: Not on file    Attends meetings of clubs or organizations: Not on file    Relationship status: Not on file  . Intimate partner violence    Fear of current or ex partner: Not on file    Emotionally abused: Not on file    Physically abused: Not on file    Forced sexual activity: Not on file  Other Topics Concern  . 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      FH:  Family History  Problem Relation Age of Onset  . Hypertension Father   . Diabetes Mellitus II Father   . CAD Father        CABG  in his 23s  . Hypertension Mother   . Diabetes Mellitus II Mother   . Stroke Paternal Grandmother     Past Medical History:  Diagnosis Date  . Coronary artery disease   . Essential hypertension   . GERD (gastroesophageal reflux disease)   . Hyperlipidemia   . Low back pain   . Systolic CHF (HCC)    a. Found to have new low EF 11/2014 with abnormal nuc.  . Type 2 diabetes mellitus (HCC)     Current Outpatient Medications  Medication Sig Dispense Refill  . aspirin EC 81 MG tablet Take 1 tablet (81 mg total) by mouth daily. 90 tablet 3  . atorvastatin (LIPITOR) 80 MG tablet TAKE 1 TABLET BY MOUTH ONCE DAILY AT  6  PM 30 tablet 0  . BIDIL 20-37.5 MG tablet TAKE 1 TABLET BY MOUTH THREE TIMES DAILY 90 tablet 11  . carvedilol (COREG) 12.5 MG tablet Take 1 tablet (12.5 mg total) by mouth 2 (two) times daily. 180 tablet 3  . ENTRESTO 97-103 MG TAKE 1 TABLET BY MOUTH TWICE DAILY 60 tablet 11  . famotidine (PEPCID AC) 10 MG chewable tablet Chew 10 mg by  mouth daily as needed for heartburn.    Marland Kitchen glucose blood test strip 1 each by Other route See admin instructions. Check blood sugar once daily    . nicotine (NICODERM CQ - DOSED IN MG/24 HOURS) 14 mg/24hr patch Place 1 patch (14 mg total) onto the skin daily. 28 patch 0  . nicotine (NICODERM CQ - DOSED IN MG/24 HR) 7 mg/24hr patch Place 1 patch (7 mg total) onto the skin daily. 28 patch 2  . nitroGLYCERIN (NITROSTAT) 0.4 MG SL tablet Place 1 tablet (0.4 mg total) under the tongue every 5 (five) minutes as needed for chest pain. 25 tablet 5  . Semaglutide (OZEMPIC, 0.25 OR 0.5 MG/DOSE, [place]) Inject into the skin.    Marland Kitchen spironolactone (ALDACTONE) 25 MG tablet TAKE 1/2 (ONE-HALF) TABLET BY MOUTH ONCE DAILY 15 tablet 11  . ticagrelor (BRILINTA) 60 MG TABS tablet Take 1 tablet (60 mg total) by mouth 2 (two) times daily. 180 tablet 3  . tobramycin (TOBREX) 0.3 % ophthalmic solution Place 1-2 drops into the left eye as needed (for eye infection).     No current facility-administered medications for this encounter.     Vitals:   02/03/19 1203  BP: 130/60  Pulse: (!) 102  SpO2: 98%  Weight: 82.2 kg (181 lb 4.8 oz)   Wt Readings from Last 3 Encounters:  02/03/19 82.2 kg (181 lb 4.8 oz)  01/11/19 79.4 kg (175 lb)  02/04/18 85.7 kg (189 lb)     PHYSICAL EXAM:  General:  Well appearing. No resp difficulty HEENT: normal Neck: supple. no JVD. Carotids 2+ bilat; no bruits. No lymphadenopathy or thryomegaly appreciated. Cor: PMI nondisplaced. Regular rate & rhythm. No rubs, gallops or murmurs. Lungs: clear Abdomen: soft, nontender, nondistended. No hepatosplenomegaly. No bruits or masses. Good bowel sounds. Extremities: no cyanosis, clubbing, rash, edema Neuro: alert & orientedx3, cranial nerves grossly intact. moves all 4 extremities w/o difficulty. Affect pleasant   ASSESSMENT & PLAN: 1. Chronic systolic heart failure  - due mostly to NICM. EF 15-20% with biventricular dysfunction in 6/17  -  echo 9/17 with EF 45-50%.  - Echo 8/19: EF 60-65%, grade 1 DD, trivial TR - Stable NYHA I. Volume status looks good   -Continue Entresto 97/103, spiro 12.5 mg daily and Bidil 1 tab TID. -  Increase carvedilol to 18.75 bid  - Recent K was 5.4. Will recheck.  - Repeat echo in 1 year  2. CAD s/p NSTEMI 6/17   -- s/p DES to LAD 6/17   -- no s/s ischemia   --continue ASA, statin, b-blocker and Brilinta   -- Last LDL good at 54 3. Tobacco abuse   - Only smoking a few cigs/day. Encouraged to quit  5. DM2 - Hgb A1c 6.9 %  - We discussed SGLT2i again .Doesn't want Jardiance due to risk of [place] gangrene 6. PAD with claudication   - ABI suggested of bilateral SFA occlusion. Follows with Dr. Fletcher Anon. 7. HTN   - Blood pressure well controlled. Continue current regimen.   Glori Bickers , MD 12:16 PM

## 2019-02-03 NOTE — Addendum Note (Signed)
Encounter addended by: Valeda Malm, RN on: 02/03/2019 12:37 PM  Actions taken: Order list changed, Clinical Note Signed

## 2019-02-03 NOTE — Addendum Note (Signed)
Encounter addended by: Valeda Malm, RN on: 02/03/2019 12:34 PM  Actions taken: Order list changed, Diagnosis association updated, Pharmacy for encounter modified

## 2019-02-20 ENCOUNTER — Other Ambulatory Visit (HOSPITAL_COMMUNITY): Payer: Self-pay | Admitting: Internal Medicine

## 2019-03-23 ENCOUNTER — Other Ambulatory Visit (HOSPITAL_COMMUNITY): Payer: Self-pay | Admitting: Internal Medicine

## 2019-04-13 ENCOUNTER — Other Ambulatory Visit: Payer: Self-pay | Admitting: Family Medicine

## 2019-04-13 DIAGNOSIS — Z1231 Encounter for screening mammogram for malignant neoplasm of breast: Secondary | ICD-10-CM

## 2019-05-01 ENCOUNTER — Other Ambulatory Visit (HOSPITAL_COMMUNITY): Payer: Self-pay | Admitting: Internal Medicine

## 2019-06-06 ENCOUNTER — Other Ambulatory Visit (HOSPITAL_COMMUNITY): Payer: Self-pay | Admitting: Internal Medicine

## 2019-07-05 ENCOUNTER — Other Ambulatory Visit (HOSPITAL_COMMUNITY)
Admission: RE | Admit: 2019-07-05 | Discharge: 2019-07-05 | Disposition: A | Discharge status: Home / Self Care | Payer: 59 | Source: Ambulatory Visit | Attending: Family Medicine | Admitting: Family Medicine

## 2019-07-05 ENCOUNTER — Other Ambulatory Visit: Payer: Self-pay | Admitting: Family Medicine

## 2019-07-05 DIAGNOSIS — Z124 Encounter for screening for malignant neoplasm of cervix: Secondary | ICD-10-CM | POA: Diagnosis not present

## 2019-07-07 ENCOUNTER — Other Ambulatory Visit (HOSPITAL_COMMUNITY): Payer: Self-pay | Admitting: Internal Medicine

## 2019-07-07 LAB — CYTOLOGY - PAP
Comment: NEGATIVE
Diagnosis: NEGATIVE
High risk HPV: NEGATIVE

## 2019-08-09 ENCOUNTER — Other Ambulatory Visit (HOSPITAL_COMMUNITY): Payer: Self-pay | Admitting: Internal Medicine

## 2019-08-10 ENCOUNTER — Other Ambulatory Visit (HOSPITAL_COMMUNITY): Payer: Self-pay | Admitting: Internal Medicine

## 2019-08-30 ENCOUNTER — Ambulatory Visit: Payer: 59 | Admitting: Cardiovascular Disease

## 2019-08-30 ENCOUNTER — Encounter: Payer: Self-pay | Admitting: Cardiovascular Disease

## 2019-08-30 ENCOUNTER — Other Ambulatory Visit: Payer: Self-pay

## 2019-08-30 VITALS — BP 118/72 | HR 97 | Ht 64.0 in | Wt 185.0 lb

## 2019-08-30 DIAGNOSIS — I5022 Chronic systolic (congestive) heart failure: Secondary | ICD-10-CM

## 2019-08-30 DIAGNOSIS — I2510 Atherosclerotic heart disease of native coronary artery without angina pectoris: Secondary | ICD-10-CM | POA: Diagnosis not present

## 2019-08-30 DIAGNOSIS — I739 Peripheral vascular disease, unspecified: Secondary | ICD-10-CM | POA: Diagnosis not present

## 2019-08-30 DIAGNOSIS — E785 Hyperlipidemia, unspecified: Secondary | ICD-10-CM | POA: Diagnosis not present

## 2019-08-30 NOTE — Progress Notes (Signed)
Cardiology Office Note   Date:  08/30/2019   ID:  Sandra Owens, DOB [birth year], MRN [id number]  PCP:  Carol Ada, MD  Cardiologist: Dr. Haroldine Laws  No chief complaint on file.     History of Present Illness: Sandra Owens is a 54 y.o. female who is here today for a follow up visit regarding  peripheral arterial disease. She has known history of chronic systolic heart failure and coronary artery disease. Ejection fraction initially in June, 2017 was 15-20%. She had LAD PCI.  Most recent echocardiogram in August 2019 showed an EF of 60 to 65%.  She is followed for bilateral calf claudication due to SFA occlusion bilaterally.  She has been treated medically given that her symptoms improved with medical therapy. She has been doing well with no recent chest pain or shortness of breath.  She takes her medications regularly.  She continues to work full-time.  She has mild bilateral calf claudication.  She continues to use nicotine patch to help her quit smoking but has not been able to quit completely.   Past Medical History:  Diagnosis Date  . Coronary artery disease   . Essential hypertension   . GERD (gastroesophageal reflux disease)   . Hyperlipidemia   . Low back pain   . Systolic CHF ([place])    a. Found to have new low EF 11/2014 with abnormal nuc.  . Type 2 diabetes mellitus ([place])     Past Surgical History:  Procedure Laterality Date  . CARDIAC CATHETERIZATION N/A 11/23/2014   Procedure: Right/Left Heart Cath and Coronary Angiography;  Surgeon: Jettie Booze, MD;  Location: [place] CV LAB;  Service: Cardiovascular;  Laterality: N/A;  . CARDIAC CATHETERIZATION N/A 10/15/2015   Procedure: Right/Left Heart Cath and Coronary Angiography;  Surgeon: Belva Crome, MD;  Location: [place] CV LAB;  Service: Cardiovascular;  Laterality: N/A;  . CARDIAC CATHETERIZATION N/A 10/17/2015   Procedure: Coronary Stent Intervention;  Surgeon: Belva Crome, MD;  Location: [place]  CV LAB;  Service: Cardiovascular;  Laterality: N/A;  . CORONARY ANGIOPLASTY    . TUBAL LIGATION       Current Outpatient Medications  Medication Sig Dispense Refill  . aspirin EC 81 MG tablet Take 1 tablet (81 mg total) by mouth daily. 90 tablet 3  . atorvastatin (LIPITOR) 80 MG tablet TAKE 1 TABLET BY MOUTH ONCE DAILY AT  6  PM 90 tablet 3  . BRILINTA 60 MG TABS tablet Take 1 tablet by mouth twice daily 180 tablet 3  . carvedilol (COREG) 12.5 MG tablet Take 1.5 tablets (18.75 mg total) by mouth 2 (two) times daily. 90 tablet 1  . ENTRESTO 97-103 MG Take 1 tablet by mouth twice daily 180 tablet 3  . famotidine (PEPCID AC) 10 MG chewable tablet Chew 10 mg by mouth daily as needed for heartburn.    Marland Kitchen glucose blood test strip 1 each by Other route See admin instructions. Check blood sugar once daily    . isosorbide-hydrALAZINE (BIDIL) 20-37.5 MG tablet Take 1 tablet by mouth 3 (three) times daily. 270 tablet 3  . nicotine (NICODERM CQ - DOSED IN MG/24 HOURS) 14 mg/24hr patch Place 1 patch (14 mg total) onto the skin daily. 28 patch 0  . nicotine (NICODERM CQ - DOSED IN MG/24 HR) 7 mg/24hr patch Place 1 patch (7 mg total) onto the skin daily. 28 patch 2  . nitroGLYCERIN (NITROSTAT) 0.4 MG SL tablet Place 1 tablet (0.4 mg total)  under the tongue every 5 (five) minutes as needed for chest pain. 25 tablet 5  . Semaglutide (OZEMPIC, 0.25 OR 0.5 MG/DOSE, [place]) Inject into the skin.    Marland Kitchen spironolactone (ALDACTONE) 25 MG tablet Take 1/2 (one-half) tablet by mouth once daily 45 tablet 3  . tobramycin (TOBREX) 0.3 % ophthalmic solution Place 1-2 drops into the left eye as needed (for eye infection).     No current facility-administered medications for this visit.    Allergies:   Patient has no known allergies.    Social History:  The patient  reports that she has been smoking cigarettes. She has a 7.50 pack-year smoking history. She has never used smokeless tobacco. She reports that she does not drink  alcohol or use drugs.   Family History:  The patient's family history includes CAD in her father; Diabetes Mellitus II in her father and mother; Hypertension in her father and mother; Stroke in her paternal grandmother.    ROS:  Please see the history of present illness.   Otherwise, review of systems are positive for none.   All other systems are reviewed and negative.    PHYSICAL EXAM: VS:  BP 118/72   Pulse 97   Ht 5\' 4"  (1.626 m)   Wt 185 lb (83.9 kg)   SpO2 97%   BMI 31.76 kg/m  , BMI Body mass index is 31.76 kg/m. GEN: Well nourished, well developed, in no acute distress  HEENT: normal  Neck: no JVD, carotid bruits, or masses Cardiac: RRR; no murmurs, rubs, or gallops,no edema  Respiratory:  clear to auscultation bilaterally, normal work of breathing GI: soft, nontender, nondistended, + BS MS: no deformity or atrophy  Skin: warm and dry, no rash Neuro:  Strength and sensation are intact Psych: euthymic mood, full affect Vascular: Distal pulses are not palpable.   EKG:  EKG is ordered today. EKG showed normal sinus rhythm with left atrial enlargement.  Recent Labs: 01/11/2019: ALT 24; Hemoglobin 13.5; Platelets 311 02/03/2019: BUN 12; Creatinine, Ser 0.93; Potassium 4.1; Sodium 140    Lipid Panel    Component Value Date/Time   CHOL 114 01/11/2019 1229   TRIG 89 01/11/2019 1229   HDL 43 01/11/2019 1229   CHOLHDL 2.7 01/11/2019 1229   CHOLHDL 5.2 10/14/2015 0417   VLDL 24 10/14/2015 0417   LDLCALC 54 01/11/2019 1229      Wt Readings from Last 3 Encounters:  08/30/19 185 lb (83.9 kg)  02/03/19 181 lb 4.8 oz (82.2 kg)  01/11/19 175 lb (79.4 kg)        ASSESSMENT AND PLAN:  1.  Peripheral arterial disease with minimal bilateral leg claudication due to bilateral SFA occlusion:    Continue medical therapy.  She has not been as active the last few months of the winter time due to Covid but I encouraged her to resume walking.  2. Coronary artery disease  involving native coronary arteries without angina:  She is doing well with no anginal symptoms.  She is on long-term aspirin and low-dose ticagrelor given the presence of peripheral arterial disease in addition to her CAD.  We should consider switching her from ticagrelor to low-dose Xarelto in the future  3. Chronic systolic heart failure: She appears to be euvolemic and currently on optimal medical therapy.  Most recent ejection fraction was normal.  She continues to follow with Dr. 07-21-1985.   4. Tobacco use: She cut down on tobacco use nicotine patch but has not been able to  quit completely.  5. Hyperlipidemia: Continue treatment with atorvastatin with a target LDL of less than 70.     Disposition:   FU with me in 12 months  Signed,  Lorine Bears, MD  08/30/2019 8:24 AM    [facility]

## 2019-08-30 NOTE — Patient Instructions (Signed)
Medication Instructions:  No changes *If you need a refill on your cardiac medications before your next appointment, please call your pharmacy*   Lab Work: None orderd If you have labs (blood work) drawn today and your tests are completely normal, you will receive your results only by: Marland Kitchen MyChart Message (if you have MyChart) OR . A paper copy in the mail If you have any lab test that is abnormal or we need to change your treatment, we will call you to review the results.   Testing/Procedures: None ordered   Follow-Up: At [facility], you and your health needs are our priority.  As part of our continuing mission to provide you with exceptional heart care, we have created designated Provider Care Teams.  These Care Teams include your primary Cardiologist (physician) and Advanced Practice Providers (APPs -  Physician Assistants and Nurse Practitioners) who all work together to provide you with the care you need, when you need it.  We recommend signing up for the patient portal called "MyChart".  Sign up information is provided on this After Visit Summary.  MyChart is used to connect with patients for Virtual Visits (Telemedicine).  Patients are able to view lab/test results, encounter notes, upcoming appointments, etc.  Non-urgent messages can be sent to your provider as well.   To learn more about what you can do with MyChart, go to [email or web address].    Your next appointment:   12 month(s)  The format for your next appointment:   In Person  Provider:   Lorine Bears, MD

## 2019-10-04 ENCOUNTER — Ambulatory Visit
Admission: RE | Admit: 2019-10-04 | Discharge: 2019-10-04 | Disposition: A | Discharge status: Home / Self Care | Payer: 59 | Source: Ambulatory Visit | Attending: Family Medicine | Admitting: Family Medicine

## 2019-10-04 ENCOUNTER — Other Ambulatory Visit: Payer: Self-pay

## 2019-10-04 DIAGNOSIS — Z1231 Encounter for screening mammogram for malignant neoplasm of breast: Secondary | ICD-10-CM

## 2019-10-06 ENCOUNTER — Other Ambulatory Visit: Payer: Self-pay | Admitting: Family Medicine

## 2019-10-06 DIAGNOSIS — R928 Other abnormal and inconclusive findings on diagnostic imaging of breast: Secondary | ICD-10-CM

## 2019-10-12 ENCOUNTER — Other Ambulatory Visit: Payer: Self-pay

## 2019-10-12 ENCOUNTER — Ambulatory Visit
Admission: RE | Admit: 2019-10-12 | Discharge: 2019-10-12 | Disposition: A | Discharge status: Home / Self Care | Payer: 59 | Source: Ambulatory Visit | Attending: Family Medicine | Admitting: Family Medicine

## 2019-10-12 ENCOUNTER — Other Ambulatory Visit: Payer: Self-pay | Admitting: Family Medicine

## 2019-10-12 DIAGNOSIS — R928 Other abnormal and inconclusive findings on diagnostic imaging of breast: Secondary | ICD-10-CM

## 2019-10-13 ENCOUNTER — Telehealth: Payer: Self-pay | Admitting: Cardiovascular Disease

## 2019-10-13 NOTE — Telephone Encounter (Signed)
Carollee Herter from [facility] called to verify which office prescribes the patient's brilinta. I informed her it is prescribed by Dr. Gala Romney and transferred her to their office.

## 2019-10-17 ENCOUNTER — Telehealth (HOSPITAL_COMMUNITY): Payer: Self-pay

## 2019-10-17 NOTE — Telephone Encounter (Signed)
received a medical clearance form from [facility] for a colonoscopy. Form signed and successfully faxed back to them at [phone].

## 2019-10-18 ENCOUNTER — Other Ambulatory Visit: Payer: 59

## 2019-11-12 ENCOUNTER — Other Ambulatory Visit (HOSPITAL_COMMUNITY): Payer: Self-pay | Admitting: Internal Medicine

## 2020-01-20 ENCOUNTER — Other Ambulatory Visit: Payer: 59

## 2020-02-06 ENCOUNTER — Ambulatory Visit
Admission: RE | Admit: 2020-02-06 | Discharge: 2020-02-06 | Disposition: A | Discharge status: Home / Self Care | Payer: 59 | Source: Ambulatory Visit | Attending: Family Medicine | Admitting: Family Medicine

## 2020-02-06 ENCOUNTER — Other Ambulatory Visit: Payer: Self-pay

## 2020-02-06 DIAGNOSIS — R928 Other abnormal and inconclusive findings on diagnostic imaging of breast: Secondary | ICD-10-CM

## 2020-04-15 NOTE — Progress Notes (Signed)
Patient ID: Sandra Owens, female   DOB: Oct 23, 1965, 54 y.o.   MRN: [id number] .  Patient ID: Sandra Owens, female   DOB: June 19, 1965, 54 y.o.   MRN: [id number] . PCP: Merri Brunette  HPI:  Sandra Owens is a 54 y.o. female woman with obesity, HTN, DM2, CAD, chronic systolic HF due to primarily NICM.  Echo 7/16 EF 30%  Admitted 6/17 with NSTEMI and ADHF. Echo EF of 15-20%. R/L cath which severe biventricular CHF EF less than 20%, severe pulmonary hypertension with PA pressure 67mm Hg. severe 95% mid LAD stenosis. Had staged PCI of LAD c/b cardiogenic shock during the interventional part of cardiac cath du to jailing of a septal branch   She presents today for regular follow up. Doing great. 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 No SOB, orthopnea or PND. No CP. Taking all meds. BP at home 120/80s   Echo 12/21 EF 60-65% RV ok. Personally reviewed   Echo 8/19 EF 60-65%   Cath 6/17  LAD 90% LCX 70% RCA 40%  RA 20 RV 67/22 PA 76/33 (53) PCWP 44 CO/CI = 3.3/1.7  Review of systems complete and found to be negative unless listed in HPI.   SH:  Social History   Socioeconomic History  . Marital status: Single    Spouse name: Not on file  . Number of children: 3  . Years of education: Not on file  . Highest education level: Not on file  Occupational History  . Not on file  Tobacco Use  . Smoking status: Current Every Day Smoker    Packs/day: 0.25    Years: 30.00    Pack years: 7.50    Types: Cigarettes    Last attempt to quit: 10/12/2015    Years since quitting: 4.5  . Smokeless tobacco: Never Used  Vaping Use  . Vaping Use: Never used  Substance and Sexual Activity  . Alcohol use: No    Alcohol/week: 0.0 standard drinks  . Drug use: No  . Sexual activity: Not on file  Other Topics Concern  . 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     Social Determinants of Health    Financial Resource Strain: Not on file  Food Insecurity: Not on file  Transportation Needs: Not on file  Physical Activity: Not on file  Stress: Not on file  Social Connections: Not on file  Intimate Partner Violence: Not on file    FH:  Family History  Problem Relation Age of Onset  . Hypertension Father   . Diabetes Mellitus II Father   . CAD Father        CABG in his 20s  . Hypertension Mother   . Diabetes Mellitus II Mother   . Stroke Paternal Grandmother     Past Medical History:  Diagnosis Date  . Coronary artery disease   . Essential hypertension   . GERD (gastroesophageal reflux disease)   . Hyperlipidemia   . Low back pain   . Systolic CHF (HCC)    a. Found to have new low EF 11/2014 with abnormal nuc.  . Type 2 diabetes mellitus (HCC)     Current Outpatient Medications  Medication Sig Dispense Refill  . aspirin EC 81 MG tablet Take 1 tablet (81 mg total) by mouth daily. 90 tablet 3  . atorvastatin (LIPITOR) 80 MG tablet TAKE 1 TABLET BY MOUTH ONCE DAILY AT  6  PM 90 tablet 3  . BRILINTA 60 MG TABS tablet Take 1 tablet by mouth twice daily 180 tablet 3  . carvedilol (COREG) 12.5 MG tablet Take 1.5 tablets (18.75 mg total) by mouth 2 (two) times daily with a meal. 270 tablet 3  . ENTRESTO 97-103 MG Take 1 tablet by mouth twice daily 180 tablet 3  . famotidine (PEPCID AC) 10 MG chewable tablet Chew 10 mg by mouth daily as needed for heartburn.    Marland Kitchen glucose blood test strip 1 each by Other route See admin instructions. Check blood sugar once daily    . isosorbide-hydrALAZINE (BIDIL) 20-37.5 MG tablet Take 1 tablet by mouth 3 (three) times daily. 270 tablet 3  . nicotine (NICODERM CQ - DOSED IN MG/24 HOURS) 14 mg/24hr patch Place 1 patch (14 mg total) onto the skin daily. 28 patch 0  . nicotine (NICODERM CQ - DOSED IN MG/24 HR) 7 mg/24hr patch Place 1 patch (7 mg total) onto the skin daily. 28 patch 2  . nitroGLYCERIN (NITROSTAT) 0.4 MG SL tablet Place 1 tablet  (0.4 mg total) under the tongue every 5 (five) minutes as needed for chest pain. 25 tablet 5  . Semaglutide (OZEMPIC, 0.25 OR 0.5 MG/DOSE, [place]) Inject into the skin.    Marland Kitchen spironolactone (ALDACTONE) 25 MG tablet Take 1/2 (one-half) tablet by mouth once daily 45 tablet 3  . tobramycin (TOBREX) 0.3 % ophthalmic solution Place 1-2 drops into the left eye as needed (for eye infection).     No current facility-administered medications for this encounter.    There were no vitals filed for this visit. Wt Readings from Last 3 Encounters:  08/30/19 83.9 kg (185 lb)  02/03/19 82.2 kg (181 lb 4.8 oz)  01/11/19 79.4 kg (175 lb)     PHYSICAL EXAM:  General:  Well appearing. No resp difficulty HEENT: normal Neck: supple. no JVD. Carotids 2+ bilat; no bruits. No lymphadenopathy or thryomegaly appreciated. Cor: PMI nondisplaced. Regular rate & rhythm. No rubs, gallops or murmurs. Lungs: clear Abdomen: soft, nontender, nondistended. No hepatosplenomegaly. No bruits or masses. Good bowel sounds. Extremities: no cyanosis, clubbing, rash, edema Neuro: alert & orientedx3, cranial nerves grossly intact. moves all 4 extremities w/o difficulty. Affect pleasant  ASSESSMENT & PLAN:  1. Chronic systolic heart failure  - due mostly to NICM. EF 15-20% with biventricular dysfunction in 6/17  - echo 9/17 with EF 45-50%.  - Echo 8/19: EF 60-65%, grade 1 DD, trivial TR - Echo today: EF 60-65% normal RV - Stable NYHA I. Volume status looks good   -Continue Entresto 97/103, spiro 12.5 mg daily and Bidil 1 tab TID. - Contine carvedilol 18.75 bid  2. CAD s/p NSTEMI 6/17   -- s/p DES to LAD 6/17   -- no s/s ischemia   --continue ASA, statin, b-blocker and Brilinta   -- Last LDL good at 54. Due for recheck with PCP  3. Tobacco abuse - discussed need for smoking cessation   4. DM2 - Hgb A1c 6.9 %  - Previously discussed SGLT2i again .Doesn't want Jardiance due to risk of Fourniers gangrene  5. PAD with  claudication   - ABI suggested of bilateral SFA occlusion. Follows with Dr. Kirke Corin.  6. HTN - BP high here but well controlled at home. Continue current regimen.   Doing great. EF stable at 60-65%. No clinical HF. Can f/u with [facility].    Arvilla Meres , MD 8:12 PM

## 2020-04-16 ENCOUNTER — Encounter (HOSPITAL_COMMUNITY): Payer: Self-pay | Admitting: Internal Medicine

## 2020-04-16 ENCOUNTER — Ambulatory Visit (HOSPITAL_BASED_OUTPATIENT_CLINIC_OR_DEPARTMENT_OTHER)
Admission: RE | Admit: 2020-04-16 | Discharge: 2020-04-16 | Disposition: A | Discharge status: Home / Self Care | Payer: 59 | Source: Ambulatory Visit | Attending: Internal Medicine | Admitting: Internal Medicine

## 2020-04-16 ENCOUNTER — Other Ambulatory Visit: Payer: Self-pay

## 2020-04-16 ENCOUNTER — Ambulatory Visit (HOSPITAL_COMMUNITY)
Admission: RE | Admit: 2020-04-16 | Discharge: 2020-04-16 | Disposition: A | Discharge status: Home / Self Care | Payer: 59 | Source: Ambulatory Visit | Attending: Internal Medicine | Admitting: Internal Medicine

## 2020-04-16 VITALS — BP 160/90 | HR 82 | Wt 183.2 lb

## 2020-04-16 DIAGNOSIS — I2510 Atherosclerotic heart disease of native coronary artery without angina pectoris: Secondary | ICD-10-CM | POA: Insufficient documentation

## 2020-04-16 DIAGNOSIS — I5022 Chronic systolic (congestive) heart failure: Secondary | ICD-10-CM | POA: Diagnosis not present

## 2020-04-16 DIAGNOSIS — E785 Hyperlipidemia, unspecified: Secondary | ICD-10-CM | POA: Diagnosis not present

## 2020-04-16 DIAGNOSIS — Z8249 Family history of ischemic heart disease and other diseases of the circulatory system: Secondary | ICD-10-CM | POA: Diagnosis not present

## 2020-04-16 DIAGNOSIS — I10 Essential (primary) hypertension: Secondary | ICD-10-CM

## 2020-04-16 DIAGNOSIS — I420 Dilated cardiomyopathy: Secondary | ICD-10-CM

## 2020-04-16 DIAGNOSIS — I110 Hypertensive heart disease with heart failure: Secondary | ICD-10-CM | POA: Insufficient documentation

## 2020-04-16 LAB — ECHOCARDIOGRAM COMPLETE
Area-P 1/2: 3.63 cm2
Calc EF: 61.1 %
S' Lateral: 2.4 cm
Single Plane A2C EF: 52.7 %
Single Plane A4C EF: 66.7 %

## 2020-04-16 NOTE — Addendum Note (Signed)
Encounter addended by: Noralee Space, RN on: 04/16/2020 2:52 PM  Actions taken: Visit diagnoses modified, Order list changed, Diagnosis association updated, Clinical Note Signed

## 2020-04-16 NOTE — Progress Notes (Signed)
  Echocardiogram 2D Echocardiogram has been performed.  Tye Savoy 04/16/2020, 1:54 PM

## 2020-04-16 NOTE — Patient Instructions (Signed)
CONGRATULATIONS!!!! You have graduated from the Advanced Heart Failure Clinic  You have been referred to Dr Duke Salvia at [facility], her office will call you for an appointment  If you have any questions or concerns before your next appointment please send [place] a message through [place] or call our office at [phone].    TO LEAVE A MESSAGE FOR THE NURSE SELECT OPTION 2, PLEASE LEAVE A MESSAGE INCLUDING: . YOUR NAME . DATE OF BIRTH . CALL BACK NUMBER . REASON FOR CALL**this is important as we prioritize the call backs  YOU WILL RECEIVE A CALL BACK THE SAME DAY AS LONG AS YOU CALL BEFORE 4:00 PM

## 2020-05-21 ENCOUNTER — Other Ambulatory Visit (HOSPITAL_COMMUNITY): Payer: Self-pay | Admitting: Internal Medicine

## 2020-06-20 ENCOUNTER — Other Ambulatory Visit: Payer: Self-pay

## 2020-06-20 ENCOUNTER — Encounter: Payer: Self-pay | Admitting: Cardiovascular Disease

## 2020-06-20 ENCOUNTER — Ambulatory Visit: Payer: 59 | Admitting: Cardiovascular Disease

## 2020-06-20 VITALS — BP 138/66 | HR 101 | Ht 64.0 in | Wt 175.8 lb

## 2020-06-20 DIAGNOSIS — R0602 Shortness of breath: Secondary | ICD-10-CM | POA: Diagnosis not present

## 2020-06-20 DIAGNOSIS — I5022 Chronic systolic (congestive) heart failure: Secondary | ICD-10-CM

## 2020-06-20 DIAGNOSIS — I2510 Atherosclerotic heart disease of native coronary artery without angina pectoris: Secondary | ICD-10-CM | POA: Diagnosis not present

## 2020-06-20 DIAGNOSIS — I10 Essential (primary) hypertension: Secondary | ICD-10-CM | POA: Diagnosis not present

## 2020-06-20 NOTE — Progress Notes (Signed)
Cardiology Office Note   Date:  06/20/2020   ID:  Sandra Owens, Sandra Owens 04-22-1966, MRN [id number]  PCP:  Merri Brunette, MD  Cardiologist:   [name], MD   No chief complaint on file.    History of Present Illness: Sandra Owens is a 55 y.o. female with chronic systolic and diastolic heart failure, hypertension, hyperlipidemia, and diabetes who presents to establish care.  She was previously cared for in the advanced heart failure clinic.  She was admitted 10/2015 with NSTEMI and acute systolic and diastolic heart failure.  LVEF was 15 to 20% at that time.  Left heart cath revealed 95% mid LAD stenosis, 70% circumflex, and 40% RA.Marland Kitchen  She had staged PCI of the LAD complicated by cardiogenic shock that occurred during the interventional portion of the cath when a septal branch was jailed.  With time her LVEF improved to 60 to 65% on echo 12/2017.  It was again stable 04/2020.  Therefore she was referred to general cardiology clinic to establish care.  At home her BP has been well-controlled.  Last weeks she had a low BP around 63/40.  She felt lightheaded.  She put herself in [place] and the episode resolved.  She puts all her medications in a pill box and is sure that she didn't take extra on accident.  She is hoping to be on fewer medications. In generally she has been well.  She does a lot of walking at work.  She doesn't get much exercise outside of work and is hesitant to go to a gym.  Her weight has been stable.  She plans to start walking more now that the weather is improving.     Past Medical History:  Diagnosis Date  . Coronary artery disease   . Essential hypertension   . GERD (gastroesophageal reflux disease)   . Hyperlipidemia   . Low back pain   . Systolic CHF (HCC)    a. Found to have new low EF 11/2014 with abnormal nuc.  . Type 2 diabetes mellitus (HCC)     Past Surgical History:  Procedure Laterality Date  . CARDIAC CATHETERIZATION N/A  11/23/2014   Procedure: Right/Left Heart Cath and Coronary Angiography;  Surgeon: Corky Crafts, MD;  Location: [facility] INVASIVE CV LAB;  Service: Cardiovascular;  Laterality: N/A;  . CARDIAC CATHETERIZATION N/A 10/15/2015   Procedure: Right/Left Heart Cath and Coronary Angiography;  Surgeon: Lyn Records, MD;  Location: [facility] INVASIVE CV LAB;  Service: Cardiovascular;  Laterality: N/A;  . CARDIAC CATHETERIZATION N/A 10/17/2015   Procedure: Coronary Stent Intervention;  Surgeon: Lyn Records, MD;  Location: [facility] INVASIVE CV LAB;  Service: Cardiovascular;  Laterality: N/A;  . CORONARY ANGIOPLASTY    . TUBAL LIGATION       Current Outpatient Medications  Medication Sig Dispense Refill  . aspirin EC 81 MG tablet Take 1 tablet (81 mg total) by mouth daily. 90 tablet 3  . atorvastatin (LIPITOR) 80 MG tablet TAKE 1 TABLET BY MOUTH ONCE DAILY AT  6  PM 90 tablet 3  . BRILINTA 60 MG TABS tablet Take 1 tablet by mouth twice daily 180 tablet 3  . carvedilol (COREG) 12.5 MG tablet Take 1.5 tablets (18.75 mg total) by mouth 2 (two) times daily with a meal. 270 tablet 3  . dapagliflozin propanediol (FARXIGA) 10 MG TABS tablet Take by mouth daily.    Marland Kitchen ENTRESTO 97-103 MG Take 1 tablet by mouth twice daily 180 tablet 3  .  famotidine (PEPCID AC) 10 MG chewable tablet Chew 10 mg by mouth daily as needed for heartburn.    Marland Kitchen glucose blood test strip 1 each by Other route See admin instructions. Check blood sugar once daily    . isosorbide-hydrALAZINE (BIDIL) 20-37.5 MG tablet Take 1 tablet by mouth 3 (three) times daily. 270 tablet 3  . nicotine (NICODERM CQ - DOSED IN MG/24 HR) 7 mg/24hr patch Place 1 patch (7 mg total) onto the skin daily. 28 patch 2  . nitroGLYCERIN (NITROSTAT) 0.4 MG SL tablet Place 1 tablet (0.4 mg total) under the tongue every 5 (five) minutes as needed for chest pain. 25 tablet 5  . Semaglutide (OZEMPIC, 0.25 OR 0.5 MG/DOSE, [place]) Inject into the skin.    Marland Kitchen spironolactone (ALDACTONE) 25 MG  tablet Take 1/2 (one-half) tablet by mouth once daily 15 tablet 0  . tobramycin (TOBREX) 0.3 % ophthalmic solution Place 1-2 drops into the left eye as needed (for eye infection).     No current facility-administered medications for this visit.    Allergies:   Patient has no known allergies.    Social History:  The patient  reports that she has been smoking cigarettes. She has a 7.50 pack-year smoking history. She has never used smokeless tobacco. She reports that she does not drink alcohol and does not use drugs.   Family History:  The patient's family history includes CAD in her father; Diabetes Mellitus II in her father and mother; Hypertension in her father and mother; Stroke in her paternal grandmother.    ROS:  Please see the history of present illness.   Otherwise, review of systems are positive for none.   All other systems are reviewed and negative.    PHYSICAL EXAM: VS:  BP 138/66   Pulse (!) 101   Ht 5\' 4"  (1.626 m)   Wt 175 lb 12.8 oz (79.7 kg)   LMP 08/05/2015 (Approximate)   SpO2 98%   BMI 30.18 kg/m  , BMI Body mass index is 30.18 kg/m. GENERAL:  Well appearing HEENT:  Pupils equal round and reactive, fundi not visualized, oral mucosa unremarkable NECK:  No jugular venous distention, waveform within normal limits, carotid upstroke brisk and symmetric, no bruits LUNGS:  Clear to auscultation bilaterally HEART:  RRR.  PMI not displaced or sustained,S1 and S2 within normal limits, no S3, no S4, no clicks, no rubs, no  murmurs ABD:  Flat, positive bowel sounds normal in frequency in pitch, no bruits, no rebound, no guarding, no midline pulsatile mass, no hepatomegaly, no splenomegaly EXT:  2 plus pulses throughout, no edema, no cyanosis no clubbing SKIN:  No rashes no nodules NEURO:  Cranial nerves II through XII grossly intact, motor grossly intact throughout PSYCH:  Cognitively intact, oriented to person place and time   EKG:  EKG is ordered today. The ekg ordered  today demonstrates sinus rhythm.  Rate 101 bpm.   06/20/2020: Sinus tachycardia.  Rate 101 bpm.  Prior septal infarct.  Echo 04/2020:   1. Left ventricular ejection fraction, by estimation, is 60 to 65%. The  left ventricle has normal function. The left ventricle has no regional  wall motion abnormalities. Left ventricular diastolic parameters are  consistent with Grade I diastolic  dysfunction (impaired relaxation).  2. Right ventricular systolic function is normal. The right ventricular  size is normal.  3. The mitral valve is normal in structure. Trivial mitral valve  regurgitation. No evidence of mitral stenosis.  4. The aortic valve  is normal in structure. Aortic valve regurgitation is  not visualized. No aortic stenosis is present.  5. The inferior vena cava is normal in size with greater than 50%  respiratory variability, suggesting right atrial pressure of 3 mmHg.    Cath 6/17  LAD 90% LCX 70% RCA 40%  RA 20 RV 67/22 PA 76/33 (53) PCWP 44 CO/CI = 3.3/1.7   Recent Labs: No results found for requested labs within last 8760 hours.    Lipid Panel    Component Value Date/Time   CHOL 114 01/11/2019 1229   TRIG 89 01/11/2019 1229   HDL 43 01/11/2019 1229   CHOLHDL 2.7 01/11/2019 1229   CHOLHDL 5.2 10/14/2015 0417   VLDL 24 10/14/2015 0417   LDLCALC 54 01/11/2019 1229      Wt Readings from Last 3 Encounters:  06/20/20 175 lb 12.8 oz (79.7 kg)  04/16/20 183 lb 3.2 oz (83.1 kg)  08/30/19 185 lb (83.9 kg)      ASSESSMENT AND PLAN:  # Chronic systolic and diastolic heart failure: # Essential hypertension:  LVEF has recovered to normal.  She is euvolemic on exam.  Continue carvedilol, Entresto, BiDil, and spironolactone.  She is interested in trying to stop all of her medicines.  Blood pressure was slightly above goal today but she thinks it is well have been controlled at home.  Advised her to check her blood pressures twice a day for couple weeks before  we make any decisions.  Could consider reducing her BiDil to twice a day to help her get to twice a day medications instead of the noon dose.  # CAD s/p NSTEMI and LAD PCI: # Hyperlipidemia: 70% left circumflex lesion is medically managed.  She had successful PCI of the LAD.  She has no angina.  Discussed importance of increasing her exercise to 150 minutes weekly.  Lipids were well have been controlled when checked in 2020.  She is due to have labs with her PCP next week.  LDL goal is less than 70.  Continue aspirin, ticagrelor, carvedilol, atorvastatin, and BiDil.     Current medicines are reviewed at length with the patient today.  The patient does not have concerns regarding medicines.  The following changes have been made:  no change  Labs/ tests ordered today include:   Orders Placed This Encounter  Procedures  . EKG 12-Lead     Disposition:   FU with Brynda Heick C. Duke Salvia, MD, [facility] in 6 months.  APP in 1 month.     Signed, Loula Marcella C. Duke Salvia, MD, [facility]  06/20/2020 5:50 PM    [facility]

## 2020-06-20 NOTE — Patient Instructions (Signed)
Medication Instructions:  Your physician recommends that you continue on your current medications as directed. Please refer to the Current Medication list given to you today.  *If you need a refill on your cardiac medications before your next appointment, please call your pharmacy*  Lab Work: NONE   Testing/Procedures: NONE   Follow-Up: At [facility], you and your health needs are our priority.  As part of our continuing mission to provide you with exceptional heart care, we have created designated Provider Care Teams.  These Care Teams include your primary Cardiologist (physician) and Advanced Practice Providers (APPs -  Physician Assistants and Nurse Practitioners) who all work together to provide you with the care you need, when you need it.  We recommend signing up for the patient portal called "MyChart".  Sign up information is provided on this After Visit Summary.  MyChart is used to connect with patients for Virtual Visits (Telemedicine).  Patients are able to view lab/test results, encounter notes, upcoming appointments, etc.  Non-urgent messages can be sent to your provider as well.   To learn more about what you can do with MyChart, go to [email or web address].    Your next appointment:   1 month(s)  The format for your next appointment:   Virtual Visit   Provider:   You may see DR [facility]  or one of the following Advanced Practice Providers on your designated Care Team:    Corine Shelter, PA-C  [place], [place]  Edd Fabian, [place]  Your physician wants you to follow-up in: 6 MONTHS  You will receive a reminder letter in the mail two months in advance. If you don't receive a letter, please call our office to schedule the follow-up appointment.  Other Instructions  MONITOR BLOOD PRESSURE TWICE A DAY HAVE LOG AT YOUR 1 MONTH FOLLOW UP

## 2020-06-29 ENCOUNTER — Other Ambulatory Visit (HOSPITAL_COMMUNITY): Payer: Self-pay | Admitting: Internal Medicine

## 2020-07-17 ENCOUNTER — Telehealth: Payer: 59 | Admitting: Cardiovascular Disease

## 2020-07-17 ENCOUNTER — Other Ambulatory Visit (HOSPITAL_COMMUNITY): Payer: Self-pay | Admitting: Internal Medicine

## 2020-08-28 ENCOUNTER — Other Ambulatory Visit (HOSPITAL_COMMUNITY): Payer: Self-pay | Admitting: Internal Medicine

## 2020-08-29 ENCOUNTER — Other Ambulatory Visit (HOSPITAL_COMMUNITY): Payer: Self-pay

## 2020-08-29 ENCOUNTER — Other Ambulatory Visit (HOSPITAL_COMMUNITY): Payer: Self-pay | Admitting: Internal Medicine

## 2020-08-29 MED ORDER — BIDIL 20-37.5 MG PO TABS: 1.0000 | ORAL_TABLET | Freq: Three times a day (TID) | ORAL | 3 refills | Status: DC

## 2020-10-02 ENCOUNTER — Other Ambulatory Visit (HOSPITAL_COMMUNITY): Payer: Self-pay | Admitting: Internal Medicine

## 2020-11-08 ENCOUNTER — Other Ambulatory Visit (HOSPITAL_COMMUNITY): Payer: Self-pay | Admitting: Internal Medicine

## 2020-11-19 ENCOUNTER — Other Ambulatory Visit (HOSPITAL_COMMUNITY): Payer: Self-pay | Admitting: Internal Medicine

## 2020-11-19 ENCOUNTER — Other Ambulatory Visit: Payer: Self-pay | Admitting: Family Medicine

## 2020-11-19 DIAGNOSIS — Z1231 Encounter for screening mammogram for malignant neoplasm of breast: Secondary | ICD-10-CM

## 2020-12-10 ENCOUNTER — Other Ambulatory Visit (HOSPITAL_COMMUNITY): Payer: Self-pay | Admitting: Internal Medicine

## 2020-12-10 NOTE — Telephone Encounter (Signed)
*  STAT* If patient is at the pharmacy, call can be transferred to refill team.   1. Which medications need to be refilled? (please list name of each medication and dose if known) carvedilol (COREG) 12.5 MG tablet [[id number]]   2. Which pharmacy/location (including street and city if local pharmacy) is medication to be sent to? [facility] on [address]   3. Do they need a 30 day or 90 day supply? 90

## 2021-01-01 ENCOUNTER — Ambulatory Visit: Payer: 59 | Admitting: Cardiovascular Disease

## 2021-01-01 ENCOUNTER — Encounter: Payer: Self-pay | Admitting: Cardiovascular Disease

## 2021-01-01 ENCOUNTER — Other Ambulatory Visit: Payer: Self-pay

## 2021-01-01 VITALS — BP 110/68 | HR 108 | Ht 64.0 in | Wt 183.2 lb

## 2021-01-01 DIAGNOSIS — I5022 Chronic systolic (congestive) heart failure: Secondary | ICD-10-CM | POA: Diagnosis not present

## 2021-01-01 DIAGNOSIS — I739 Peripheral vascular disease, unspecified: Secondary | ICD-10-CM

## 2021-01-01 DIAGNOSIS — I2510 Atherosclerotic heart disease of native coronary artery without angina pectoris: Secondary | ICD-10-CM

## 2021-01-01 DIAGNOSIS — Z720 Tobacco use: Secondary | ICD-10-CM | POA: Diagnosis not present

## 2021-01-01 MED ORDER — CARVEDILOL 25 MG PO TABS: 25.0000 mg | ORAL_TABLET | Freq: Two times a day (BID) | ORAL | 3 refills | Status: AC

## 2021-01-01 NOTE — Patient Instructions (Addendum)
Medication Instructions:  STOP Bydil STOP Brilinta  INCREASE COREG to 25mg  table by mouth TWICE daily  *If you need a refill on your cardiac medications before your next appointment, please call your pharmacy*   Lab Work:  If you have labs (blood work) drawn today and your tests are completely normal, you will receive your results only by: MyChart Message (if you have MyChart) OR A paper copy in the mail If you have any lab test that is abnormal or we need to change your treatment, we will call you to review the results.   Testing/Procedures: none   Follow-Up: At [facility], you and your health needs are our priority.  As part of our continuing mission to provide you with exceptional heart care, we have created designated Provider Care Teams.  These Care Teams include your primary Cardiologist (physician) and Advanced Practice Providers (APPs -  Physician Assistants and Nurse Practitioners) who all work together to provide you with the care you need, when you need it.  We recommend signing up for the patient portal called "MyChart".  Sign up information is provided on this After Visit Summary.  MyChart is used to connect with patients for Virtual Visits (Telemedicine).  Patients are able to view lab/test results, encounter notes, upcoming appointments, etc.  Non-urgent messages can be sent to your provider as well.   To learn more about what you can do with MyChart, go to [facility].    Your next appointment:   6 month(s)  The format for your next appointment:   In Person  Provider:   [email or web address], MD   Other Instructions none

## 2021-01-01 NOTE — Progress Notes (Signed)
Cardiology Office Note   Date:  01/01/2021   ID:  Sandra, Owens Jul 12, 1965, MRN [id number]  PCP:  Merri Brunette, MD  Cardiologist: Dr. Duke Salvia  No chief complaint on file.     History of Present Illness: Sandra Owens is a 55 y.o. female who is here today for a follow up visit regarding  peripheral arterial disease. She has known history of chronic systolic heart failure and coronary artery disease. Ejection fraction initially in June, 2017 was 15-20%. She had LAD PCI.  Subsequent echocardiogram in 2019 showed normalization of LV systolic function.  Most recent echocardiogram in December 2021 showed an EF of 60 to 65% with no significant valvular abnormalities.  She is followed for bilateral calf claudication due to SFA occlusion bilaterally.  She has been treated medically given that her symptoms improved with medical therapy.  She reports stable claudication overall that happens after walking about 15 to 20 minutes.  The pain is usually mild and does not force her to stop.  She is very active and walks 4000-5000 steps per day.  No chest pain or shortness of breath.  She reports chronic tachycardia even with carvedilol.  She continues to smoke few cigarettes a day.   Past Medical History:  Diagnosis Date   Coronary artery disease    Essential hypertension    GERD (gastroesophageal reflux disease)    Hyperlipidemia    Low back pain    Systolic CHF (HCC)    a. Found to have new low EF 11/2014 with abnormal nuc.   Type 2 diabetes mellitus (HCC)     Past Surgical History:  Procedure Laterality Date   CARDIAC CATHETERIZATION N/A 11/23/2014   Procedure: Right/Left Heart Cath and Coronary Angiography;  Surgeon: Corky Crafts, MD;  Location: [facility] INVASIVE CV LAB;  Service: Cardiovascular;  Laterality: N/A;   CARDIAC CATHETERIZATION N/A 10/15/2015   Procedure: Right/Left Heart Cath and Coronary Angiography;  Surgeon: Lyn Records, MD;  Location: [facility]  INVASIVE CV LAB;  Service: Cardiovascular;  Laterality: N/A;   CARDIAC CATHETERIZATION N/A 10/17/2015   Procedure: Coronary Stent Intervention;  Surgeon: Lyn Records, MD;  Location: [facility] INVASIVE CV LAB;  Service: Cardiovascular;  Laterality: N/A;   CORONARY ANGIOPLASTY     TUBAL LIGATION       Current Outpatient Medications  Medication Sig Dispense Refill   aspirin EC 81 MG tablet Take 1 tablet (81 mg total) by mouth daily. 90 tablet 3   atorvastatin (LIPITOR) 80 MG tablet TAKE 1 TABLET BY MOUTH ONCE DAILY AT  6  PM 90 tablet 3   carvedilol (COREG) 12.5 MG tablet TAKE 1 & 1/2 (ONE & ONE-HALF) TABLETS BY MOUTH TWICE DAILY WITH A MEAL 45 tablet 0   dapagliflozin propanediol (FARXIGA) 10 MG TABS tablet Take by mouth daily.     ENTRESTO 97-103 MG Take 1 tablet by mouth twice daily 180 tablet 3   famotidine (PEPCID AC) 10 MG chewable tablet Chew 10 mg by mouth daily as needed for heartburn.     glucose blood test strip 1 each by Other route See admin instructions. Check blood sugar once daily     nicotine (NICODERM CQ - DOSED IN MG/24 HR) 7 mg/24hr patch Place 1 patch (7 mg total) onto the skin daily. 28 patch 2   nitroGLYCERIN (NITROSTAT) 0.4 MG SL tablet Place 1 tablet (0.4 mg total) under the tongue every 5 (five) minutes as needed for chest pain. 25 tablet 5  Semaglutide (OZEMPIC, 0.25 OR 0.5 MG/DOSE, [place]) Inject into the skin.     spironolactone (ALDACTONE) 25 MG tablet Take 1/2 (one-half) tablet by mouth once daily 45 tablet 3   tobramycin (TOBREX) 0.3 % ophthalmic solution Place 1-2 drops into the left eye as needed (for eye infection).     No current facility-administered medications for this visit.    Allergies:   Patient has no known allergies.    Social History:  The patient  reports that she has been smoking cigarettes. She has a 7.50 pack-year smoking history. She has never used smokeless tobacco. She reports that she does not drink alcohol and does not use drugs.   Family  History:  The patient's family history includes CAD in her father; Diabetes Mellitus II in her father and mother; Hypertension in her father and mother; Stroke in her paternal grandmother.    ROS:  Please see the history of present illness.   Otherwise, review of systems are positive for none.   All other systems are reviewed and negative.    PHYSICAL EXAM: VS:  BP 110/68 (BP Location: Right Arm)   Pulse (!) 108   Ht 5\' 4"  (1.626 m)   Wt 183 lb 3.2 oz (83.1 kg)   LMP 08/05/2015 (Approximate)   SpO2 95%   BMI 31.45 kg/m  , BMI Body mass index is 31.45 kg/m. GEN: Well nourished, well developed, in no acute distress  HEENT: normal  Neck: no JVD, carotid bruits, or masses Cardiac: RRR; no murmurs, rubs, or gallops,no edema  Respiratory:  clear to auscultation bilaterally, normal work of breathing GI: soft, nontender, nondistended, + BS MS: no deformity or atrophy  Skin: warm and dry, no rash Neuro:  Strength and sensation are intact Psych: euthymic mood, full affect Vascular: Distal pulses are not palpable.   EKG:  EKG is not ordered today.    Recent Labs: No results found for requested labs within last 8760 hours.    Lipid Panel    Component Value Date/Time   CHOL 114 01/11/2019 1229   TRIG 89 01/11/2019 1229   HDL 43 01/11/2019 1229   CHOLHDL 2.7 01/11/2019 1229   CHOLHDL 5.2 10/14/2015 0417   VLDL 24 10/14/2015 0417   LDLCALC 54 01/11/2019 1229      Wt Readings from Last 3 Encounters:  01/01/21 183 lb 3.2 oz (83.1 kg)  06/20/20 175 lb 12.8 oz (79.7 kg)  04/16/20 183 lb 3.2 oz (83.1 kg)        ASSESSMENT AND PLAN:  1.  Peripheral arterial disease with minimal bilateral leg claudication due to bilateral SFA occlusion:  She walks regularly with only mild bilateral calf claudication.  Her symptoms are not lifestyle limiting to require revascularization.  Recommend continuing medical therapy.  2. Coronary artery disease involving native coronary arteries  without angina:  She is doing well with no anginal symptoms.  PCI of the LAD was more than 5 years ago.  Thus, I elected to discontinue ticagrelor.  Continue aspirin indefinitely.  3. Chronic systolic heart failure: Most recent ejection fraction was normal and she appears to be euvolemic.  She does have chronic sinus tachycardia and I think she benefits from simplifying her medications.  I elected to increase carvedilol to 25 mg twice daily and discontinue BiDil especially that her ejection fraction has been normal.  Continue other medications.  4. Tobacco use: I discussed with her the importance of smoking cessation. 5. Hyperlipidemia: Continue treatment with atorvastatin with a target  LDL of less than 70.     Disposition:   FU with me in 6 months  Signed,  Lorine Bears, MD  01/01/2021 9:13 AM    [facility]

## 2021-01-21 ENCOUNTER — Ambulatory Visit: Payer: 59

## 2021-02-13 ENCOUNTER — Ambulatory Visit: Payer: 59

## 2021-03-11 ENCOUNTER — Other Ambulatory Visit: Payer: Self-pay

## 2021-03-11 ENCOUNTER — Ambulatory Visit
Admission: RE | Admit: 2021-03-11 | Discharge: 2021-03-11 | Disposition: A | Discharge status: Home / Self Care | Payer: 59 | Source: Ambulatory Visit | Attending: Family Medicine | Admitting: Family Medicine

## 2021-03-11 DIAGNOSIS — Z1231 Encounter for screening mammogram for malignant neoplasm of breast: Secondary | ICD-10-CM

## 2021-07-16 ENCOUNTER — Other Ambulatory Visit: Payer: Self-pay

## 2021-07-16 ENCOUNTER — Ambulatory Visit: Payer: 59 | Admitting: Cardiovascular Disease

## 2021-07-16 ENCOUNTER — Encounter: Payer: Self-pay | Admitting: Cardiovascular Disease

## 2021-07-16 VITALS — BP 130/72 | HR 85 | Ht 64.0 in | Wt 186.2 lb

## 2021-07-16 DIAGNOSIS — I5022 Chronic systolic (congestive) heart failure: Secondary | ICD-10-CM | POA: Diagnosis not present

## 2021-07-16 DIAGNOSIS — Z720 Tobacco use: Secondary | ICD-10-CM

## 2021-07-16 DIAGNOSIS — I10 Essential (primary) hypertension: Secondary | ICD-10-CM

## 2021-07-16 DIAGNOSIS — E785 Hyperlipidemia, unspecified: Secondary | ICD-10-CM

## 2021-07-16 DIAGNOSIS — I2510 Atherosclerotic heart disease of native coronary artery without angina pectoris: Secondary | ICD-10-CM

## 2021-07-16 DIAGNOSIS — I739 Peripheral vascular disease, unspecified: Secondary | ICD-10-CM | POA: Diagnosis not present

## 2021-07-16 MED ORDER — NITROGLYCERIN 0.4 MG SL SUBL: 0.4000 mg | SUBLINGUAL_TABLET | SUBLINGUAL | 5 refills | Status: AC | PRN

## 2021-07-16 NOTE — Patient Instructions (Signed)
Medication Instructions:  ?No changes ?*If you need a refill on your cardiac medications before your next appointment, please call your pharmacy* ? ? ?Lab Work: ?None ordered ?If you have labs (blood work) drawn today and your tests are completely normal, you will receive your results only by: ?MyChart Message (if you have MyChart) OR ?A paper copy in the mail ?If you have any lab test that is abnormal or we need to change your treatment, we will call you to review the results. ? ? ?Testing/Procedures: ?None ordered ? ? ?Follow-Up: ?At [facility], you and your health needs are our priority.  As part of our continuing mission to provide you with exceptional heart care, we have created designated Provider Care Teams.  These Care Teams include your primary Cardiologist (physician) and Advanced Practice Providers (APPs -  Physician Assistants and Nurse Practitioners) who all work together to provide you with the care you need, when you need it. ? ?We recommend signing up for the patient portal called "MyChart".  Sign up information is provided on this After Visit Summary.  MyChart is used to connect with patients for Virtual Visits (Telemedicine).  Patients are able to view lab/test results, encounter notes, upcoming appointments, etc.  Non-urgent messages can be sent to your provider as well.   ?To learn more about what you can do with MyChart, go to [email or web address].   ? ?Your next appointment:   ?6 month(s) ? ?The format for your next appointment:   ?In Person ? ?Provider:   ?Dr. Kirke Corin ? ?Other Instructions ?Steps to Quit Smoking ?Smoking tobacco is the leading cause of preventable death. It can affect almost every organ in the body. Smoking puts you and people around you at risk for many serious, long-lasting (chronic) diseases. Quitting smoking can be hard, but it is one of the best things that you can do for your health. It is never too late to quit. ?How do I get ready to quit? ?When you decide to quit  smoking, make a plan to help you succeed. Before you quit: ?Pick a date to quit. Set a date within the next 2 weeks to give you time to prepare. ?Write down the reasons why you are quitting. Keep this list in places where you will see it often. ?Tell your family, friends, and co-workers that you are quitting. Their support is important. ?Talk with your doctor about the choices that may help you quit. ?Find out if your health insurance will pay for these treatments. ?Know the people, places, things, and activities that make you want to smoke (triggers). Avoid them. ?What first steps can I take to quit smoking? ?Throw away all cigarettes at home, at work, and in your car. ?Throw away the things that you use when you smoke, such as ashtrays and lighters. ?Clean your car. Make sure to empty the ashtray. ?Clean your home, including curtains and carpets. ?What can I do to help me quit smoking? ?Talk with your doctor about taking medicines and seeing a counselor at the same time. You are more likely to succeed when you do both. ?If you are pregnant or breastfeeding, talk with your doctor about counseling or other ways to quit smoking. Do not take medicine to help you quit smoking unless your doctor tells you to do so. ?To quit smoking: ?Quit right away ?Quit smoking totally, instead of slowly cutting back on how much you smoke over a period of time. ?Go to counseling. You are more likely to quit if  you go to counseling sessions regularly. ?Take medicine ?You may take medicines to help you quit. Some medicines need a prescription, and some you can buy over-the-counter. Some medicines may contain a drug called nicotine to replace the nicotine in cigarettes. Medicines may: ?Help you to stop having the desire to smoke (cravings). ?Help to stop the problems that come when you stop smoking (withdrawal symptoms). ?Your doctor may ask you to use: ?Nicotine patches, gum, or lozenges. ?Nicotine inhalers or sprays. ?Non-nicotine  medicine that is taken by mouth. ?Find resources ?Find resources and other ways to help you quit smoking and remain smoke-free after you quit. These resources are most helpful when you use them often. They include: ?Online chats with a Veterinary surgeon. ?Phone quitlines. ?Automotive engineer. ?Support groups or group counseling. ?Text messaging programs. ?Mobile phone apps. Use apps on your mobile phone or tablet that can help you stick to your quit plan. There are many free apps for mobile phones and tablets as well as websites. Examples include Quit Guide from the [employer] and smokefree.gov ? ?What things can I do to make it easier to quit? ? ?Talk to your family and friends. Ask them to support and encourage you. ?Call a phone quitline (1-800-QUIT-NOW), reach out to support groups, or work with a Veterinary surgeon. ?Ask people who smoke to not smoke around you. ?Avoid places that make you want to smoke, such as: ?Bars. ?Parties. ?Smoke-break areas at work. ?Spend time with people who do not smoke. ?Lower the stress in your life. Stress can make you want to smoke. Try these things to help your stress: ?Getting regular exercise. ?Doing deep-breathing exercises. ?Doing yoga. ?Meditating. ?Doing a body scan. To do this, close your eyes, focus on one area of your body at a time from head to toe. Notice which parts of your body are tense. Try to relax the muscles in those areas. ?How will I feel when I quit smoking? ?Day 1 to 3 weeks ?Within the first 24 hours, you may start to have some problems that come from quitting tobacco. These problems are very bad 2-3 days after you quit, but they do not often last for more than 2-3 weeks. You may get these symptoms: ?Mood swings. ?Feeling restless, nervous, angry, or annoyed. ?Trouble concentrating. ?Dizziness. ?Strong desire for high-sugar foods and nicotine. ?Weight gain. ?Trouble pooping (constipation). ?Feeling like you may vomit (nausea). ?Coughing or a sore throat. ?Changes in how  the medicines that you take for other issues work in your body. ?Depression. ?Trouble sleeping (insomnia). ?Week 3 and afterward ?After the first 2-3 weeks of quitting, you may start to notice more positive results, such as: ?Better sense of smell and taste. ?Less coughing and sore throat. ?Slower heart rate. ?Lower blood pressure. ?Clearer skin. ?Better breathing. ?Fewer sick days. ?Quitting smoking can be hard. Do not give up if you fail the first time. Some people need to try a few times before they succeed. Do your best to stick to your quit plan, and talk with your doctor if you have any questions or concerns. ?Summary ?Smoking tobacco is the leading cause of preventable death. Quitting smoking can be hard, but it is one of the best things that you can do for your health. ?When you decide to quit smoking, make a plan to help you succeed. ?Quit smoking right away, not slowly over a period of time. ?When you start quitting, seek help from your doctor, family, or friends. ?This information is not intended to replace advice  given to you by your health care provider. Make sure you discuss any questions you have with your health care provider. ?Document Revised: 12/28/2020 Document Reviewed: 07/10/2018 ?Elsevier Patient Education ? 2022 Elsevier Inc. ? ? ?

## 2021-07-16 NOTE — Progress Notes (Signed)
?  ?Cardiology Office Note ? ? ?Date:  07/16/2021  ? ?ID:  Sandra Owens, DOB 15-Sep-1965, MRN [id number] ? ?PCP:  Merri Brunette, MD  ?Cardiologist: Dr. Duke Salvia ? ?No chief complaint on file. ? ? ? ?  ?History of Present Illness: ?Sandra Owens is a 56 y.o. female who is here today for a follow up visit regarding  peripheral arterial disease. ?She has known history of chronic systolic heart failure and coronary artery disease. Ejection fraction initially in June, 2017 was 15-20%. She had LAD PCI.  Subsequent echocardiogram in 2019 showed normalization of LV systolic function.  Most recent echocardiogram in December 2021 showed an EF of 60 to 65% with no significant valvular abnormalities. ? ?She is followed for bilateral calf claudication due to SFA occlusion bilaterally.  She has been treated medically given that her symptoms improved with medical therapy. ? ?She has been doing well overall with no chest pain or shortness of breath.  She describes stable mild bilateral calf claudication with no rest pain or lower extremity ulceration. ?She has not been able to quit smoking but she is trying. ? ?Past Medical History:  ?Diagnosis Date  ? Coronary artery disease   ? Essential hypertension   ? GERD (gastroesophageal reflux disease)   ? Hyperlipidemia   ? Low back pain   ? Systolic CHF (HCC)   ? a. Found to have new low EF 11/2014 with abnormal nuc.  ? Type 2 diabetes mellitus (HCC)   ? ? ?Past Surgical History:  ?Procedure Laterality Date  ? CARDIAC CATHETERIZATION N/A 11/23/2014  ? Procedure: Right/Left Heart Cath and Coronary Angiography;  Surgeon: Corky Crafts, MD;  Location: [facility] INVASIVE CV LAB;  Service: Cardiovascular;  Laterality: N/A;  ? CARDIAC CATHETERIZATION N/A 10/15/2015  ? Procedure: Right/Left Heart Cath and Coronary Angiography;  Surgeon: Lyn Records, MD;  Location: [facility] INVASIVE CV LAB;  Service: Cardiovascular;  Laterality: N/A;  ? CARDIAC CATHETERIZATION N/A 10/17/2015  ?  Procedure: Coronary Stent Intervention;  Surgeon: Lyn Records, MD;  Location: [facility] INVASIVE CV LAB;  Service: Cardiovascular;  Laterality: N/A;  ? CORONARY ANGIOPLASTY    ? TUBAL LIGATION    ? ? ? ?Current Outpatient Medications  ?Medication Sig Dispense Refill  ? aspirin EC 81 MG tablet Take 1 tablet (81 mg total) by mouth daily. 90 tablet 3  ? atorvastatin (LIPITOR) 80 MG tablet TAKE 1 TABLET BY MOUTH ONCE DAILY AT  6  PM 90 tablet 3  ? carvedilol (COREG) 25 MG tablet Take 1 tablet (25 mg total) by mouth 2 (two) times daily with a meal. 90 tablet 3  ? dapagliflozin propanediol (FARXIGA) 10 MG TABS tablet Take by mouth daily.    ? ENTRESTO 97-103 MG Take 1 tablet by mouth twice daily 180 tablet 3  ? famotidine (PEPCID AC) 10 MG chewable tablet Chew 10 mg by mouth daily as needed for heartburn.    ? glucose blood test strip 1 each by Other route See admin instructions. Check blood sugar once daily    ? nicotine (NICODERM CQ - DOSED IN MG/24 HR) 7 mg/24hr patch Place 1 patch (7 mg total) onto the skin daily. 28 patch 2  ? nitroGLYCERIN (NITROSTAT) 0.4 MG SL tablet Place 1 tablet (0.4 mg total) under the tongue every 5 (five) minutes as needed for chest pain. 25 tablet 5  ? Semaglutide (OZEMPIC, 0.25 OR 0.5 MG/DOSE, [place]) Inject into the skin.    ? spironolactone (ALDACTONE) 25 MG  tablet Take 1/2 (one-half) tablet by mouth once daily 45 tablet 3  ? tobramycin (TOBREX) 0.3 % ophthalmic solution Place 1-2 drops into the left eye as needed (for eye infection).    ? ?No current facility-administered medications for this visit.  ? ? ?Allergies:   Patient has no known allergies.  ? ? ?Social History:  The patient  reports that she has been smoking cigarettes. She has a 7.50 pack-year smoking history. She has never used smokeless tobacco. She reports that she does not drink alcohol and does not use drugs.  ? ?Family History:  The patient's family history includes CAD in her father; Diabetes Mellitus II in her father and  mother; Hypertension in her father and mother; Stroke in her paternal grandmother.  ? ? ?ROS:  Please see the history of present illness.   Otherwise, review of systems are positive for none.   All other systems are reviewed and negative.  ? ? ?PHYSICAL EXAM: ?VS:  BP 130/72   Pulse 85   Ht 5\' 4"  (1.626 m)   Wt 186 lb 3.2 oz (84.5 kg)   LMP 08/05/2015 (Approximate)   SpO2 95%   BMI 31.96 kg/m?  , BMI Body mass index is 31.96 kg/m?. ?GEN: Well nourished, well developed, in no acute distress  ?HEENT: normal  ?Neck: no JVD, carotid bruits, or masses ?Cardiac: RRR; no murmurs, rubs, or gallops,no edema  ?Respiratory:  clear to auscultation bilaterally, normal work of breathing ?GI: soft, nontender, nondistended, + BS ?MS: no deformity or atrophy  ?Skin: warm and dry, no rash ?Neuro:  Strength and sensation are intact ?Psych: euthymic mood, full affect ?Vascular: Distal pulses are not palpable. ? ? ?EKG:  EKG is ordered today. ?EKG shows normal sinus rhythm with no significant ST or T wave changes. ? ? ?Recent Labs: ?No results found for requested labs within last 8760 hours.  ? ? ?Lipid Panel ?   ?Component Value Date/Time  ? CHOL 114 01/11/2019 1229  ? TRIG 89 01/11/2019 1229  ? HDL 43 01/11/2019 1229  ? CHOLHDL 2.7 01/11/2019 1229  ? CHOLHDL 5.2 10/14/2015 0417  ? VLDL 24 10/14/2015 0417  ? LDLCALC 54 01/11/2019 1229  ? ?  ? ?Wt Readings from Last 3 Encounters:  ?07/16/21 186 lb 3.2 oz (84.5 kg)  ?01/01/21 183 lb 3.2 oz (83.1 kg)  ?06/20/20 175 lb 12.8 oz (79.7 kg)  ?  ? ? ? ? ?ASSESSMENT AND PLAN: ? ?1.  Peripheral arterial disease with mild bilateral leg claudication due to bilateral SFA occlusion:  ?She walks regularly with only mild bilateral calf claudication.  Her symptoms are not lifestyle limiting to require revascularization.  Recommend continuing medical therapy. ? ?2. Coronary artery disease involving native coronary arteries without angina:  She is doing well with no anginal symptoms.  PCI of the LAD  was more than 5 years ago.  Ticagrelor was discontinued during last visit.  Continue aspirin indefinitely. ? ?3. Chronic systolic heart failure: Most recent ejection fraction was normal and she appears to be euvolemic.  Sinus tachycardia improved after increasing her carvedilol during last visit. ? ?4. Tobacco use: I again discussed with her the importance of smoking cessation. ? ?5. Hyperlipidemia: Continue treatment with atorvastatin with a target LDL of less than 70.   ? ?The patient has a physical with her primary care provider later this month.  I asked her to forward the results of lab work. ? ? ?Disposition:   FU with me in 6  months ? ?Signed, ? ?Lorine Bears, MD  ?07/16/2021 9:45 AM    ?[facility] ?

## 2021-10-02 ENCOUNTER — Other Ambulatory Visit (HOSPITAL_COMMUNITY): Payer: Self-pay | Admitting: Cardiovascular Disease

## 2021-10-03 NOTE — Telephone Encounter (Signed)
Rx(s) sent to pharmacy electronically.  

## 2022-02-12 ENCOUNTER — Other Ambulatory Visit: Payer: Self-pay | Admitting: Family Medicine

## 2022-02-12 DIAGNOSIS — Z1231 Encounter for screening mammogram for malignant neoplasm of breast: Secondary | ICD-10-CM

## 2022-02-27 ENCOUNTER — Ambulatory Visit: Payer: 59 | Admitting: Podiatry

## 2022-03-04 ENCOUNTER — Ambulatory Visit: Payer: 59 | Admitting: Cardiovascular Disease

## 2022-03-06 ENCOUNTER — Ambulatory Visit (INDEPENDENT_AMBULATORY_CARE_PROVIDER_SITE_OTHER): Payer: 59 | Admitting: Podiatry

## 2022-03-06 DIAGNOSIS — I739 Peripheral vascular disease, unspecified: Secondary | ICD-10-CM

## 2022-03-06 DIAGNOSIS — L600 Ingrowing nail: Secondary | ICD-10-CM | POA: Diagnosis not present

## 2022-03-06 DIAGNOSIS — E1151 Type 2 diabetes mellitus with diabetic peripheral angiopathy without gangrene: Secondary | ICD-10-CM | POA: Diagnosis not present

## 2022-03-06 NOTE — Patient Instructions (Signed)

## 2022-03-10 NOTE — Progress Notes (Signed)
  Subjective:  Patient ID: Sandra Owens, female    DOB: 05/25/1965,  MRN: [id number]  Chief Complaint  Patient presents with   Ingrown Toenail    New patient, Ingrown toenail of right foot, medial border is always sore - tried many home remedies but can't get rid of it    56 y.o. female presents with the above complaint. History confirmed with patient.   Objective:  Physical Exam: warm, slow capillary refill, no trophic changes or ulcerative lesions, nonpalpable DP and PT pulses, normal sensory exam, and ingrown medial border of right hallux nail.   Assessment:   1. Ingrowing right great toenail   2. PAD (peripheral artery disease) ([name])   3. Type 2 diabetes mellitus with diabetic peripheral angiopathy without gangrene, without long-term current use of insulin ([name])      Plan:  Patient was evaluated and treated and all questions answered.  Reviewed treatment of her ingrown nail with a partial permanent matricectomy.  Currently her diabetes is on the borderline of being too risky to proceed with this it is 7.6%, additionally she smokes and has a history of PAD she follows with Dr. Sophronia Owens from cardiology for this.  She has had worsening claudication symptoms recently.  She has an upcoming appointment with him and I recommended she discuss with him and I will discuss with him as well possible revascularization, if this is successful then may proceed with partial permanent matricectomy and I advised her to continue working on her A1c to reduce this prior to such a procedure.  Return when ready for ingrown nail removal.

## 2022-03-14 ENCOUNTER — Ambulatory Visit
Admission: RE | Admit: 2022-03-14 | Discharge: 2022-03-14 | Disposition: A | Discharge status: Home / Self Care | Payer: 59 | Source: Ambulatory Visit | Attending: Family Medicine | Admitting: Family Medicine

## 2022-03-14 DIAGNOSIS — Z1231 Encounter for screening mammogram for malignant neoplasm of breast: Secondary | ICD-10-CM

## 2022-03-25 ENCOUNTER — Encounter: Payer: Self-pay | Admitting: Cardiovascular Disease

## 2022-03-25 ENCOUNTER — Ambulatory Visit: Payer: 59 | Attending: Cardiovascular Disease | Admitting: Cardiovascular Disease

## 2022-03-25 VITALS — BP 122/70 | HR 87 | Ht 64.0 in | Wt 188.2 lb

## 2022-03-25 DIAGNOSIS — Z720 Tobacco use: Secondary | ICD-10-CM | POA: Diagnosis not present

## 2022-03-25 DIAGNOSIS — I2510 Atherosclerotic heart disease of native coronary artery without angina pectoris: Secondary | ICD-10-CM

## 2022-03-25 DIAGNOSIS — I5022 Chronic systolic (congestive) heart failure: Secondary | ICD-10-CM

## 2022-03-25 DIAGNOSIS — I739 Peripheral vascular disease, unspecified: Secondary | ICD-10-CM | POA: Diagnosis not present

## 2022-03-25 DIAGNOSIS — E785 Hyperlipidemia, unspecified: Secondary | ICD-10-CM

## 2022-03-25 MED ORDER — NICOTINE 7 MG/24HR TD PT24: 7.0000 mg | MEDICATED_PATCH | Freq: Every day | TRANSDERMAL | 0 refills | Status: DC

## 2022-03-25 NOTE — Patient Instructions (Signed)
Medication Instructions:  No changes *If you need a refill on your cardiac medications before your next appointment, please call your pharmacy*   Lab Work: None ordered If you have labs (blood work) drawn today and your tests are completely normal, you will receive your results only by: [place] (if you have MyChart) OR A paper copy in the mail If you have any lab test that is abnormal or we need to change your treatment, we will call you to review the results.   Testing/Procedures: None ordered   Follow-Up: At [facility], you and your health needs are our priority.  As part of our continuing mission to provide you with exceptional heart care, we have created designated Provider Care Teams.  These Care Teams include your primary Cardiologist (physician) and Advanced Practice Providers (APPs -  Physician Assistants and Nurse Practitioners) who all work together to provide you with the care you need, when you need it.  We recommend signing up for the patient portal called "MyChart".  Sign up information is provided on this After Visit Summary.  MyChart is used to connect with patients for Virtual Visits (Telemedicine).  Patients are able to view lab/test results, encounter notes, upcoming appointments, etc.  Non-urgent messages can be sent to your provider as well.   To learn more about what you can do with MyChart, go to [email or web address].    Your next appointment:   6 month(s)  The format for your next appointment:   In Person  Provider:   Dr. Fletcher Anon  Other Instructions [place] (PAD)   General Information:   Research in vascular exercise has demonstrated remarkable improvement in symptoms of leg pain (claudication) without expensive or invasive interventions. Regular walking programs are extremely helpful for patients with PAD and intermittent claudication.  These steps are designed to help you  get started with a safe and effective program to help you walk farther with less pain:   Walk at least three times a week (preferably every day).  Your goal is to build up to 30-45 minutes of total walking time (not counting rest breaks). It may take you several weeks to build up your exercise time starting at 5-10 minutes or whatever you can tolerate.  Walk as far as possible using moderate to maximal pain (7-8 on the scale below) as a signal to stop, and resume walking when the pain goes away.  On a treadmill, set the speed and grade at a level that brings on the claudication pain within 3 to 5 minutes. Walk at this rate until you experience claudication of moderate severity, rest until the pain improves, and then resume walking.  Over time, you will be able to walk longer at the designated speed and grade; workload should then be increased until you develop the pain within 3 to 5 minutes once again.  This regimen will induce a significant benefit. Studies have demonstrated that participants may be able to walk up to three or four times farther and have less leg pain, within twelve weeks, by following this protocol.  Pain Scale    0_____1_____2_____3_____4_____5_____6_____7_____8_____9_____10   No Pain                                   Moderate Pain  Maximal Pain   Important Information About Sugar

## 2022-03-25 NOTE — Progress Notes (Signed)
Cardiology Office Note   Date:  03/25/2022   ID:  Sandra, Owens 1966/01/23, MRN [id number]  PCP:  Merri Brunette, MD  Cardiologist: Dr. Duke Salvia  No chief complaint on file.      History of Present Illness: Sandra Owens is a 56 y.o. female who is here today for a follow up visit regarding  peripheral arterial disease. She has known history of chronic systolic heart failure and coronary artery disease. Ejection fraction initially in June, 2017 was 15-20%. She had LAD PCI.  Subsequent echocardiogram in 2019 showed normalization of LV systolic function.  Most recent echocardiogram in December 2021 showed an EF of 60 to 65% with no significant valvular abnormalities.  She is followed for bilateral calf claudication due to SFA occlusion bilaterally.  She has been treated medically given that her symptoms improved with medical therapy.  She was seen recently by Dr. Lilian Kapur for right big toe ingrown nail.  She reported some worsening of calf claudication.  Right calf claudication slightly worse than left and happens with long distance walking overall.  She is active at work and sometimes she has to stop for few minutes.  No lower extremity ulceration or rest pain.  Past Medical History:  Diagnosis Date   Coronary artery disease    Essential hypertension    GERD (gastroesophageal reflux disease)    Hyperlipidemia    Low back pain    Systolic CHF (HCC)    a. Found to have new low EF 11/2014 with abnormal nuc.   Type 2 diabetes mellitus (HCC)     Past Surgical History:  Procedure Laterality Date   CARDIAC CATHETERIZATION N/A 11/23/2014   Procedure: Right/Left Heart Cath and Coronary Angiography;  Surgeon: Corky Crafts, MD;  Location: [facility] INVASIVE CV LAB;  Service: Cardiovascular;  Laterality: N/A;   CARDIAC CATHETERIZATION N/A 10/15/2015   Procedure: Right/Left Heart Cath and Coronary Angiography;  Surgeon: Lyn Records, MD;  Location: [facility] INVASIVE CV  LAB;  Service: Cardiovascular;  Laterality: N/A;   CARDIAC CATHETERIZATION N/A 10/17/2015   Procedure: Coronary Stent Intervention;  Surgeon: Lyn Records, MD;  Location[facility] INVASIVE CV LAB;  Service: Cardiovascular;  Laterality: N/A;   CORONARY ANGIOPLASTY     TUBAL LIGATION       Current Outpatient Medications  Medication Sig Dispense Refill   aspirin EC 81 MG tablet Take 1 tablet (81 mg total) by mouth daily. 90 tablet 3   atorvastatin (LIPITOR) 80 MG tablet TAKE 1 TABLET BY MOUTH ONCE DAILY AT  6  PM 90 tablet 3   carvedilol (COREG) 25 MG tablet Take 1 tablet (25 mg total) by mouth 2 (two) times daily with a meal. 90 tablet 3   dapagliflozin propanediol (FARXIGA) 10 MG TABS tablet Take by mouth daily.     ENTRESTO 97-103 MG Take 1 tablet by mouth twice daily 180 tablet 2   famotidine (PEPCID AC) 10 MG chewable tablet Chew 10 mg by mouth daily as needed for heartburn.     glucose blood test strip 1 each by Other route See admin instructions. Check blood sugar once daily     JANUVIA 100 MG tablet Take 100 mg by mouth daily.     nitroGLYCERIN (NITROSTAT) 0.4 MG SL tablet Place 1 tablet (0.4 mg total) under the tongue every 5 (five) minutes as needed for chest pain. 25 tablet 5   Semaglutide (OZEMPIC, 0.25 OR 0.5 MG/DOSE, [place]) Inject into the skin.  spironolactone (ALDACTONE) 25 MG tablet Take 1/2 (one-half) tablet by mouth once daily 45 tablet 3   tobramycin (TOBREX) 0.3 % ophthalmic solution Place 1-2 drops into the left eye as needed (for eye infection).     nicotine (NICODERM CQ - DOSED IN MG/24 HR) 7 mg/24hr patch Place 1 patch (7 mg total) onto the skin daily. 28 patch 0   No current facility-administered medications for this visit.    Allergies:   Patient has no known allergies.    Social History:  The patient  reports that she has been smoking cigarettes. She has a 7.50 pack-year smoking history. She has never used smokeless tobacco. She reports that she does not drink alcohol  and does not use drugs.   Family History:  The patient's family history includes CAD in her father; Diabetes Mellitus II in her father and mother; Hypertension in her father and mother; Stroke in her paternal grandmother.    ROS:  Please see the history of present illness.   Otherwise, review of systems are positive for none.   All other systems are reviewed and negative.    PHYSICAL EXAM: VS:  BP 122/70 (BP Location: Left Arm, Patient Position: Sitting, Cuff Size: Normal)   Pulse 87   Ht 5\' 4"  (1.626 m)   Wt 188 lb 3.2 oz (85.4 kg)   LMP 08/05/2015 (Approximate)   SpO2 98%   BMI 32.30 kg/m  , BMI Body mass index is 32.3 kg/m. GEN: Well nourished, well developed, in no acute distress  HEENT: normal  Neck: no JVD, carotid bruits, or masses Cardiac: RRR; no murmurs, rubs, or gallops,no edema  Respiratory:  clear to auscultation bilaterally, normal work of breathing GI: soft, nontender, nondistended, + BS MS: no deformity or atrophy  Skin: warm and dry, no rash Neuro:  Strength and sensation are intact Psych: euthymic mood, full affect Vascular: Distal pulses are not palpable.   EKG:  EKG is not ordered today.    Recent Labs: No results found for requested labs within last 365 days.    Lipid Panel    Component Value Date/Time   CHOL 114 01/11/2019 1229   TRIG 89 01/11/2019 1229   HDL 43 01/11/2019 1229   CHOLHDL 2.7 01/11/2019 1229   CHOLHDL 5.2 10/14/2015 0417   VLDL 24 10/14/2015 0417   LDLCALC 54 01/11/2019 1229      Wt Readings from Last 3 Encounters:  03/25/22 188 lb 3.2 oz (85.4 kg)  07/16/21 186 lb 3.2 oz (84.5 kg)  01/01/21 183 lb 3.2 oz (83.1 kg)        ASSESSMENT AND PLAN:  1.  Peripheral arterial disease with mild-moderate bilateral leg claudication due to bilateral SFA occlusion:  I agree that any foot surgery is risky in terms of chance of healing given her peripheral arterial disease, continued smoking and being diabetic.  I discussed with  her revascularization options.  She does not feel that her symptoms are lifestyle limiting. I discussed with her the long-term patency of endovascular intervention and the need for repeated procedures.  For now, she prefers to continue with medical therapy unless her symptoms worsen. I discussed with her the importance of a regular exercise program.   2. Coronary artery disease involving native coronary arteries without angina:  She is doing well with no anginal symptoms.  PCI of the LAD was more than 5 years ago.  Ticagrelor was discontinued during last visit.  Continue aspirin indefinitely.  3. Chronic systolic heart failure:  Most recent ejection fraction was normal and she appears to be euvolemic.  Sinus tachycardia improved after increasing her carvedilol.  4. Tobacco use: I again discussed with her the importance of smoking cessation.  I refilled her nicotine patch.  5. Hyperlipidemia: Continue treatment with atorvastatin with a target LDL of less than 70.      Disposition:   FU with me in 6 months  Signed,  Lorine Bears, MD  03/25/2022 10:11 AM    [facility]

## 2022-09-23 ENCOUNTER — Ambulatory Visit: Payer: 59 | Admitting: Cardiovascular Disease

## 2022-10-02 LAB — COLOGUARD: COLOGUARD: NEGATIVE

## 2022-10-02 LAB — EXTERNAL GENERIC LAB PROCEDURE: COLOGUARD: NEGATIVE

## 2023-01-02 ENCOUNTER — Ambulatory Visit (HOSPITAL_BASED_OUTPATIENT_CLINIC_OR_DEPARTMENT_OTHER): Payer: 59 | Admitting: Cardiovascular Disease

## 2023-01-02 ENCOUNTER — Telehealth: Payer: Self-pay

## 2023-01-02 ENCOUNTER — Encounter (HOSPITAL_BASED_OUTPATIENT_CLINIC_OR_DEPARTMENT_OTHER): Payer: Self-pay | Admitting: Cardiovascular Disease

## 2023-01-02 VITALS — BP 130/80 | HR 95 | Ht 64.0 in | Wt 174.0 lb

## 2023-01-02 DIAGNOSIS — I2510 Atherosclerotic heart disease of native coronary artery without angina pectoris: Secondary | ICD-10-CM

## 2023-01-02 DIAGNOSIS — R400 Somnolence: Secondary | ICD-10-CM

## 2023-01-02 DIAGNOSIS — I10 Essential (primary) hypertension: Secondary | ICD-10-CM | POA: Diagnosis not present

## 2023-01-02 DIAGNOSIS — I420 Dilated cardiomyopathy: Secondary | ICD-10-CM | POA: Diagnosis not present

## 2023-01-02 DIAGNOSIS — I5022 Chronic systolic (congestive) heart failure: Secondary | ICD-10-CM | POA: Diagnosis not present

## 2023-01-02 DIAGNOSIS — Z0000 Encounter for general adult medical examination without abnormal findings: Secondary | ICD-10-CM

## 2023-01-02 DIAGNOSIS — R0683 Snoring: Secondary | ICD-10-CM

## 2023-01-02 DIAGNOSIS — F439 Reaction to severe stress, unspecified: Secondary | ICD-10-CM

## 2023-01-02 NOTE — Telephone Encounter (Signed)
Called patient per health coaching referral for stress management per Dr. Duke Salvia. Patient did not answer. Left message for patient to return call.   Renaee Munda, MS, ERHD, [facility]  Care Guide, Health & Wellness Coach [address], [address] [place] [address] Telephone: [phone] Email: [email or web address]

## 2023-01-02 NOTE — Patient Instructions (Addendum)
Medication Instructions:  Your physician recommends that you continue on your current medications as directed. Please refer to the Current Medication list given to you today.   *If you need a refill on your cardiac medications before your next appointment, please call your pharmacy*  Lab Work: NONE  Testing/Procedures: Your physician has requested that you have an echocardiogram. Echocardiography is a painless test that uses sound waves to create images of your heart. It provides your doctor with information about the size and shape of your heart and how well your heart's chambers and valves are working. This procedure takes approximately one hour. There are no restrictions for this procedure. Please do NOT wear cologne, perfume, aftershave, or lotions (deodorant is allowed). Please arrive 15 minutes prior to your appointment time.  [facility] HOME SLEEP STUDY  THE OFFICE WILL CALL WITH ACTIVATION CODE ONCE INSURANCE HAS BEEN REVIEWED   Follow-Up: At [facility], you and your health needs are our priority.  As part of our continuing mission to provide you with exceptional heart care, we have created designated Provider Care Teams.  These Care Teams include your primary Cardiologist (physician) and Advanced Practice Providers (APPs -  Physician Assistants and Nurse Practitioners) who all work together to provide you with the care you need, when you need it.  We recommend signing up for the patient portal called "MyChart".  Sign up information is provided on this After Visit Summary.  MyChart is used to connect with patients for Virtual Visits (Telemedicine).  Patients are able to view lab/test results, encounter notes, upcoming appointments, etc.  Non-urgent messages can be sent to your provider as well.   To learn more about what you can do with MyChart, go to [email or web address].    Your next appointment:   12 month(s)  Provider:   [name], MD or Gillian Shields, NP     Other Instructions WatchPAT?  Is a FDA cleared portable home sleep study test that uses a watch and 3 points of contact to monitor 7 different channels, including your heart rate, oxygen saturations, body position, snoring, and chest motion.  The study is easy to use from the comfort of your own home and accurately detect sleep apnea.  Before bed, you attach the chest sensor, attached the sleep apnea bracelet to your nondominant hand, and attach the finger probe.  After the study, the raw data is downloaded from the watch and scored for apnea events.   For more information: [email or web address]  Patient Testing Instructions:  Do not put battery into the device until bedtime when you are ready to begin the test. Please call the support number if you need assistance after following the instructions below: 24 hour support line- [phone] or [facility] support at [phone] (option 2)  Download the IntelWatchPAT One" app through the google play store or App Store  Be sure to turn on or enable access to bluetooth in settlings on your smartphone/ device  Make sure no other bluetooth devices are on and within the vicinity of your smartphone/ device and WatchPAT watch during testing.  Make sure to leave your smart phone/ device plugged in and charging all night.  When ready for bed:  Follow the instructions step by step in the WatchPAT One App to activate the testing device. For additional instructions, including video instruction, visit the WatchPAT One video on Youtube. You can search for WatchPat One within Youtube (video is 4 minutes and 18 seconds) or enter: https://youtube/watch?v=BCce_vbiwxE Please note: You will be  prompted to enter a Pin to connect via bluetooth when starting the test. The PIN will be assigned to you when you receive the test.  The device is disposable, but it recommended that you retain the device until you receive a call letting you know the study has  been received and the results have been interpreted.  We will let you know if the study did not transmit to [place] properly after the test is completed. You do not need to call us to confirm the receipt of the test.  Please complete the test within 48 hours of receiving PIN.   Frequently Asked Questions:  What is Watch Dennie Bible one?  A single use fully disposable home sleep apnea testing device and will not need to be returned after completion.  What are the requirements to use WatchPAT one?  The be able to have a successful watchpat one sleep study, you should have your Watch pat one device, your smart phone, watch pat one app, your PIN number and Internet access What type of phone do I need?  You should have a smart phone that uses Android 5.1 and above or any Iphone with IOS 10 and above How can I download the WatchPAT one app?  Based on your device type search for WatchPAT one app either in google play for android devices or APP store for Iphone's Where will I get my PIN for the study?  Your PIN will be provided by your physician's office. It is used for authentication and if you lose/forget your PIN, please reach out to your providers office.  I do not have Internet at home. Can I do WatchPAT one study?  WatchPAT One needs Internet connection throughout the night to be able to transmit the sleep data. You can use your home/local internet or your cellular's data package. However, it is always recommended to use home/local Internet. It is estimated that between 20MB-30MB will be used with each study.However, the application will be looking for space in the phone to start the study.  What happens if I lose internet or bluetooth connection?  During the internet disconnection, your phone will not be able to transmit the sleep data. All the data, will be stored in your phone. As soon as the internet connection is back on, the phone will being sending the sleep data. During the bluetooth disconnection,  WatchPAT one will not be able to to send the sleep data to your phone. Data will be kept in the [facility] one until two devices have bluetooth connection back on. As soon as the connection is back on, WatchPAT one will send the sleep data to the phone.  How long do I need to wear the WatchPAT one?  After you start the study, you should wear the device at least 6 hours.  How far should I keep my phone from the device?  During the night, your phone should be within 15 feet.  What happens if I leave the room for restroom or other reasons?  Leaving the room for any reason will not cause any problem. As soon as your get back to the room, both devices will reconnect and will continue to send the sleep data. Can I use my phone during the sleep study?  Yes, you can use your phone as usual during the study. But it is recommended to put your watchpat one on when you are ready to go to bed.  How will I get my study results?  A soon  as you completed your study, your sleep data will be sent to the provider. They will then share the results with you when they are ready.

## 2023-01-02 NOTE — Progress Notes (Signed)
Cardiology Office Note:  .   Date:  01/02/2023  ID:  Sandra Owens, DOB 11/21/1965, MRN [id number] PCP: Merri Brunette, MD  [facility] Providers Cardiologist:  None    History of Present Illness: .   Sandra Owens is a 57 y.o. female with chronic systolic and diastolic heart failure with normalization of systolic function, CAD status post PCI, hypertension, hyperlipidemia, tobacco abuse, and diabetes who presents for follow up.  She was previously cared for in the advanced heart failure clinic.  She was admitted 10/2015 with NSTEMI and acute systolic and diastolic heart failure.  LVEF was 15 to 20% at that time.  Left heart cath revealed 95% mid LAD stenosis, 70% circumflex, and 40% RA.Marland Kitchen  She had staged PCI of the LAD complicated by cardiogenic shock that occurred during the interventional portion of the cath when a septal branch was jailed.  With time her LVEF improved to 60 to 65% on echo 12/2017.  It was again stable 04/2020.  Therefore she was referred to general cardiology clinic to establish care.   At her visit 06/2020 she reported some low blood pressures at home.  However in the office her blood pressure was 130/66.  She was asked to track her blood pressures and follow-up with any PCP in 1 month.  This did not occur.  She saw Dr. Kirke Corin 12/2020 for claudication.  Symptoms were mild and he recommended continued medical management.  Ticagrelor was discontinued.  She last saw him 03/2022 and was stable but claudication was increasing slightly.  [name] presents with intermittent episodes of anxiety, particularly at night during sleep. These episodes are described as a sensation of the heart 'seizing up', causing significant distress, but are not associated with any specific triggers. The patient denies any associated chest pain or pressure. She also reports chronic fatigue, feeling 'tired all the time', despite no known sleep disorders.  In addition to the  anxiety episodes, the patient has been experiencing persistent shoulder pain for several weeks. The pain is bothersome but does not limit the range of motion. She has been managing the pain with topical Biofreeze.  She also reports frequent upper respiratory infections over the past two years, requiring treatment with prednisone, inhalers, and antibiotics.  Regarding lifestyle, the patient is a current smoker, with smoking frequency varying depending on stress levels. She works in a [employer], achieving over 10,000 steps daily. However, she struggles to incorporate additional exercise into their routine.  The patient's medication regimen includes carvedilol, Entresto, spironolactone, Farxiga, and aspirin for heart disease management, and atorvastatin for cholesterol control. Her blood pressure has been stable, and recent labs showed good cholesterol control but slightly elevated A1c levels.  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 She has considered seeking therapy and has been trying to incorporate more self-care activities into her routine.      ROS:  Per HPI  Studies Reviewed: Marland Kitchen    EKG 01/02/23: Sinus rhythm.  Rate 95 bpm.  Risk Assessment/Calculations:             Physical Exam:    VS:  BP 130/80 (BP Location: Left Arm, Patient Position: Sitting, Cuff Size: Normal)   Pulse 95   Ht 5\' 4"  (1.626 m)   Wt 174 lb (78.9 kg)   LMP 08/05/2015 (Approximate)   BMI 29.87 kg/m  , BMI Body mass index is 29.87 kg/m. GENERAL:  Well appearing HEENT:  Pupils equal round and reactive, fundi not visualized, oral mucosa unremarkable NECK:  No jugular venous distention, waveform within normal limits, carotid upstroke brisk and symmetric, no bruits, no thyromegaly LUNGS:  Clear to auscultation bilaterally HEART:  RRR.  PMI not displaced or sustained,S1 and S2 within normal limits, no S3, no S4, no clicks, no  rubs, no murmurs ABD:  Flat, positive bowel sounds normal in frequency in pitch, no bruits, no rebound, no guarding, no midline pulsatile mass, no hepatomegaly, no splenomegaly EXT:  2 plus pulses throughout, no edema, no cyanosis no clubbing SKIN:  No rashes no nodules NEURO:  Cranial nerves II through XII grossly intact, motor grossly intact throughout PSYCH:  Cognitively intact, oriented to person place and time   ASSESSMENT AND PLAN: .       # HFpEF:  Prior systolic HF with recovered LVEF.  Stable on current regimen of carvedilol, Entresto, spironolactone, Farxiga, and aspirin. No new symptoms of chest pain or shortness of breath. Blood pressure controlled. -Continue current medication regimen. -Order echocardiogram to reassess cardiac function, last performed in 2021.   # Anxiety Patient reports intermittent episodes of severe anxiety, often occurring during sleep. No identified triggers. Patient prefers to manage without additional medication. -Referral to health coach, Renaee Munda, for stress management strategies. -[facility] home sleep sudy. -Recommend therapy.  She doesn't want more medication  # Fatigue Patient reports feeling tired all the time. History of sleep apnea study approximately seven years ago with no significant findings. Recent weight loss. -Order home sleep study to reassess for sleep apnea.  # Smoking Patient continues to smoke, with variability in frequency depending on stress levels. -Encourage smoking cessation and discuss potential strategies with health coach.  General Health Maintenance -Continue exercise as able, aiming for moderate intensity activity most days of the week. -Plan for follow-up in one year, or sooner if new symptoms arise.             Dispo: f/u 1 year  Signed, [name], MD

## 2023-01-08 ENCOUNTER — Telehealth: Payer: Self-pay

## 2023-01-08 DIAGNOSIS — Z0000 Encounter for general adult medical examination without abnormal findings: Secondary | ICD-10-CM

## 2023-01-08 NOTE — Telephone Encounter (Signed)
Called patient to follow up regarding a health coaching referral for stress management per Dr. Duke Salvia. Patient did not answer. Left voicemail to return call.    Renaee Munda, MS, ERHD, [facility]  Care Guide, Health & Wellness Coach [address], [address] [place] [address] Telephone: [phone] Email: [email or web address]

## 2023-01-16 ENCOUNTER — Other Ambulatory Visit (HOSPITAL_COMMUNITY): Payer: Self-pay | Admitting: Cardiovascular Disease

## 2023-01-16 ENCOUNTER — Ambulatory Visit (INDEPENDENT_AMBULATORY_CARE_PROVIDER_SITE_OTHER): Payer: 59

## 2023-01-16 DIAGNOSIS — I10 Essential (primary) hypertension: Secondary | ICD-10-CM | POA: Diagnosis not present

## 2023-01-16 DIAGNOSIS — I5022 Chronic systolic (congestive) heart failure: Secondary | ICD-10-CM

## 2023-01-16 DIAGNOSIS — I420 Dilated cardiomyopathy: Secondary | ICD-10-CM | POA: Diagnosis not present

## 2023-01-16 LAB — ECHOCARDIOGRAM COMPLETE
Area-P 1/2: 5.02 cm2
S' Lateral: 2.8 cm

## 2023-01-16 NOTE — Telephone Encounter (Signed)
Rx request sent to pharmacy.

## 2023-01-22 ENCOUNTER — Telehealth: Payer: Self-pay

## 2023-01-22 DIAGNOSIS — Z0000 Encounter for general adult medical examination without abnormal findings: Secondary | ICD-10-CM

## 2023-01-22 NOTE — Telephone Encounter (Signed)
Called patient to discuss health coaching referral for stress management per Dr. Duke Salvia. Patient is interested in trying health coaching but will be out of town for some time. Patient has been scheduled for her initial session over the phone on 10/7 at 12:00pm. Patient will be called at this time.    Renaee Munda, MS, ERHD, [facility]  Care Guide, Health & Wellness Coach [address], [address] [place] [address] Telephone: [phone] Email: [email or web address]

## 2023-02-09 ENCOUNTER — Telehealth: Payer: Self-pay

## 2023-02-09 ENCOUNTER — Ambulatory Visit: Payer: 59

## 2023-02-09 DIAGNOSIS — Z0000 Encounter for general adult medical examination without abnormal findings: Secondary | ICD-10-CM

## 2023-02-09 NOTE — Telephone Encounter (Signed)
Called patient to hold initial telephonic health coaching appointment. Patient's phone went straight to voicemail. Left message for patient to return call to hold session or to reschedule if necessary.    Renaee Munda, MS, ERHD, [facility]  Care Guide, Health & Wellness Coach [address] [place] [address] Telephone: [phone] Email: [email or web address]

## 2023-02-20 ENCOUNTER — Telehealth (HOSPITAL_BASED_OUTPATIENT_CLINIC_OR_DEPARTMENT_OTHER): Payer: Self-pay

## 2023-02-20 NOTE — Telephone Encounter (Addendum)
Call attempted to patient, no answer, unable to leave VM due to no DPR on file.   ----- Message from [name] sent at 02/20/2023 10:30 AM EDT ----- Echo shows that her heat is a little weaker again.  EF had improved to 60-65% on her echo in 2021.  On this echo it had reduced to 45-50%.  We need to make sure there is no new coronary artery disease like she has had in the past.  Recommend getting an exercise Myoview if she is able to do the treadmill.  If not, Lexiscan Myoview is fine.

## 2023-02-23 ENCOUNTER — Telehealth (HOSPITAL_BASED_OUTPATIENT_CLINIC_OR_DEPARTMENT_OTHER): Payer: Self-pay

## 2023-02-23 DIAGNOSIS — I2510 Atherosclerotic heart disease of native coronary artery without angina pectoris: Secondary | ICD-10-CM

## 2023-02-23 NOTE — Telephone Encounter (Addendum)
Seen by patient Sandra Owens on 02/20/2023  6:21 PM; follow up mychart message sent to patient.    ----- Message from [name] sent at 02/20/2023 10:30 AM EDT ----- Echo shows that her heat is a little weaker again.  EF had improved to 60-65% on her echo in 2021.  On this echo it had reduced to 45-50%.  We need to make sure there is no new coronary artery disease like she has had in the past.  Recommend getting an exercise Myoview if she is able to do the treadmill.  If not, Lexiscan Myoview is fine.

## 2023-03-05 NOTE — Addendum Note (Signed)
Addended by: Marlene Lard on: 03/05/2023 04:59 PM   Modules accepted: Orders

## 2023-03-11 ENCOUNTER — Encounter (HOSPITAL_COMMUNITY): Payer: Self-pay

## 2023-03-11 ENCOUNTER — Telehealth: Payer: Self-pay

## 2023-03-11 DIAGNOSIS — Z0000 Encounter for general adult medical examination without abnormal findings: Secondary | ICD-10-CM

## 2023-03-11 NOTE — Telephone Encounter (Signed)
Called to determine if patient was interested in reschedule health coaching appointment to continue participating in program. Patient did not answer. Left message for patient to return call.    Renaee Munda, MS, ERHD, [facility]  Care Guide, Health & Wellness Coach [address] [place] [address] Telephone: [phone] Email: [email or web address]

## 2023-03-12 ENCOUNTER — Other Ambulatory Visit: Payer: Self-pay

## 2023-03-12 ENCOUNTER — Emergency Department (HOSPITAL_COMMUNITY): Payer: 59

## 2023-03-12 ENCOUNTER — Inpatient Hospital Stay (HOSPITAL_COMMUNITY)
Admission: EM | Admit: 2023-03-12 | Discharge: 2023-03-14 | DRG: 281 | Disposition: A | Discharge status: Home / Self Care | Payer: 59 | Attending: Cardiovascular Disease | Admitting: Cardiovascular Disease

## 2023-03-12 ENCOUNTER — Inpatient Hospital Stay (HOSPITAL_COMMUNITY)
Admission: EM | Disposition: A | Discharge status: Home / Self Care | Payer: Self-pay | Source: Home / Self Care | Attending: Cardiovascular Disease

## 2023-03-12 DIAGNOSIS — I2510 Atherosclerotic heart disease of native coronary artery without angina pectoris: Secondary | ICD-10-CM | POA: Diagnosis present

## 2023-03-12 DIAGNOSIS — E7800 Pure hypercholesterolemia, unspecified: Secondary | ICD-10-CM | POA: Diagnosis present

## 2023-03-12 DIAGNOSIS — Z955 Presence of coronary angioplasty implant and graft: Secondary | ICD-10-CM

## 2023-03-12 DIAGNOSIS — Z720 Tobacco use: Secondary | ICD-10-CM

## 2023-03-12 DIAGNOSIS — Z833 Family history of diabetes mellitus: Secondary | ICD-10-CM

## 2023-03-12 DIAGNOSIS — I214 Non-ST elevation (NSTEMI) myocardial infarction: Secondary | ICD-10-CM | POA: Diagnosis present

## 2023-03-12 DIAGNOSIS — Z8249 Family history of ischemic heart disease and other diseases of the circulatory system: Secondary | ICD-10-CM | POA: Diagnosis not present

## 2023-03-12 DIAGNOSIS — I252 Old myocardial infarction: Secondary | ICD-10-CM | POA: Diagnosis not present

## 2023-03-12 DIAGNOSIS — E119 Type 2 diabetes mellitus without complications: Secondary | ICD-10-CM | POA: Diagnosis present

## 2023-03-12 DIAGNOSIS — I5042 Chronic combined systolic (congestive) and diastolic (congestive) heart failure: Secondary | ICD-10-CM | POA: Diagnosis present

## 2023-03-12 DIAGNOSIS — Z7984 Long term (current) use of oral hypoglycemic drugs: Secondary | ICD-10-CM

## 2023-03-12 DIAGNOSIS — I110 Hypertensive heart disease with heart failure: Secondary | ICD-10-CM | POA: Diagnosis present

## 2023-03-12 DIAGNOSIS — I25110 Atherosclerotic heart disease of native coronary artery with unstable angina pectoris: Secondary | ICD-10-CM

## 2023-03-12 DIAGNOSIS — I10 Essential (primary) hypertension: Secondary | ICD-10-CM | POA: Diagnosis not present

## 2023-03-12 DIAGNOSIS — I200 Unstable angina: Secondary | ICD-10-CM | POA: Diagnosis present

## 2023-03-12 DIAGNOSIS — Z79899 Other long term (current) drug therapy: Secondary | ICD-10-CM | POA: Diagnosis not present

## 2023-03-12 DIAGNOSIS — I739 Peripheral vascular disease, unspecified: Secondary | ICD-10-CM | POA: Diagnosis present

## 2023-03-12 DIAGNOSIS — Z9851 Tubal ligation status: Secondary | ICD-10-CM

## 2023-03-12 DIAGNOSIS — Z7902 Long term (current) use of antithrombotics/antiplatelets: Secondary | ICD-10-CM | POA: Diagnosis not present

## 2023-03-12 DIAGNOSIS — Z7982 Long term (current) use of aspirin: Secondary | ICD-10-CM | POA: Diagnosis not present

## 2023-03-12 DIAGNOSIS — G8929 Other chronic pain: Secondary | ICD-10-CM | POA: Diagnosis present

## 2023-03-12 DIAGNOSIS — F17210 Nicotine dependence, cigarettes, uncomplicated: Secondary | ICD-10-CM | POA: Diagnosis present

## 2023-03-12 DIAGNOSIS — I951 Orthostatic hypotension: Secondary | ICD-10-CM | POA: Diagnosis present

## 2023-03-12 DIAGNOSIS — K219 Gastro-esophageal reflux disease without esophagitis: Secondary | ICD-10-CM | POA: Diagnosis present

## 2023-03-12 DIAGNOSIS — R079 Chest pain, unspecified: Secondary | ICD-10-CM | POA: Diagnosis not present

## 2023-03-12 HISTORY — PX: RIGHT/LEFT HEART CATH AND CORONARY ANGIOGRAPHY: CATH118266

## 2023-03-12 LAB — BASIC METABOLIC PANEL
Anion gap: 10 (ref 5–15)
BUN: 12 mg/dL (ref 6–20)
CO2: 24 mmol/L (ref 22–32)
Calcium: 9.6 mg/dL (ref 8.9–10.3)
Chloride: 109 mmol/L (ref 98–111)
Creatinine, Ser: 1.17 mg/dL — ABNORMAL HIGH (ref 0.44–1.00)
GFR, Estimated: 54 mL/min — ABNORMAL LOW (ref >60)
Glucose, Bld: 191 mg/dL — ABNORMAL HIGH (ref 70–99)
Potassium: 4.1 mmol/L (ref 3.5–5.1)
Sodium: 143 mmol/L (ref 135–145)

## 2023-03-12 LAB — CBC
HCT: 44.8 % (ref 36.0–46.0)
Hemoglobin: 14 g/dL (ref 12.0–15.0)
MCH: 22.2 pg — ABNORMAL LOW (ref 26.0–34.0)
MCHC: 31.3 g/dL (ref 30.0–36.0)
MCV: 70.9 fL — ABNORMAL LOW (ref 80.0–100.0)
Platelets: 310 10*3/uL (ref 150–400)
RBC: 6.32 MIL/uL — ABNORMAL HIGH (ref 3.87–5.11)
RDW: 17.1 % — ABNORMAL HIGH (ref 11.5–15.5)
WBC: 5.5 10*3/uL (ref 4.0–10.5)
nRBC: 0 % (ref 0.0–0.2)

## 2023-03-12 LAB — TROPONIN I (HIGH SENSITIVITY)
Troponin I (High Sensitivity): 10 ng/L (ref <18)
Troponin I (High Sensitivity): 159 ng/L (ref <18)

## 2023-03-12 LAB — HEMOGLOBIN A1C
Hgb A1c MFr Bld: 7.2 % — ABNORMAL HIGH (ref 4.8–5.6)
Mean Plasma Glucose: 159.94 mg/dL

## 2023-03-12 LAB — POCT I-STAT EG7
Acid-base deficit: 2 mmol/L (ref 0.0–2.0)
Bicarbonate: 24.7 mmol/L (ref 20.0–28.0)
Calcium, Ion: 1.19 mmol/L (ref 1.15–1.40)
HCT: 48 % — ABNORMAL HIGH (ref 36.0–46.0)
Hemoglobin: 16.3 g/dL — ABNORMAL HIGH (ref 12.0–15.0)
O2 Saturation: 71 %
Potassium: 3.7 mmol/L (ref 3.5–5.1)
Sodium: 142 mmol/L (ref 135–145)
TCO2: 26 mmol/L (ref 22–32)
pCO2, Ven: 46 mm[Hg] (ref 44–60)
pH, Ven: 7.338 (ref 7.25–7.43)
pO2, Ven: 40 mm[Hg] (ref 32–45)

## 2023-03-12 LAB — POCT I-STAT 7, (LYTES, BLD GAS, ICA,H+H)
Acid-base deficit: 2 mmol/L (ref 0.0–2.0)
Bicarbonate: 23.2 mmol/L (ref 20.0–28.0)
Calcium, Ion: 1.22 mmol/L (ref 1.15–1.40)
HCT: 49 % — ABNORMAL HIGH (ref 36.0–46.0)
Hemoglobin: 16.7 g/dL — ABNORMAL HIGH (ref 12.0–15.0)
O2 Saturation: 95 %
Potassium: 3.9 mmol/L (ref 3.5–5.1)
Sodium: 141 mmol/L (ref 135–145)
TCO2: 24 mmol/L (ref 22–32)
pCO2 arterial: 41 mm[Hg] (ref 32–48)
pH, Arterial: 7.36 (ref 7.35–7.45)
pO2, Arterial: 79 mm[Hg] — ABNORMAL LOW (ref 83–108)

## 2023-03-12 LAB — HIV ANTIBODY (ROUTINE TESTING W REFLEX): HIV Screen 4th Generation wRfx: NONREACTIVE

## 2023-03-12 LAB — BRAIN NATRIURETIC PEPTIDE: B Natriuretic Peptide: 69.6 pg/mL (ref 0.0–100.0)

## 2023-03-12 SURGERY — RIGHT/LEFT HEART CATH AND CORONARY ANGIOGRAPHY
Anesthesia: LOCAL

## 2023-03-12 MED ORDER — OXYCODONE-ACETAMINOPHEN 5-325 MG PO TABS
2.0000 | ORAL_TABLET | Freq: Four times a day (QID) | ORAL | Status: DC | PRN
  Administered 2023-03-13 – 2023-03-14 (×4): 2 via ORAL
  Filled 2023-03-12 (×4): qty 2

## 2023-03-12 MED ORDER — ASPIRIN 81 MG PO CHEW
324.0000 mg | CHEWABLE_TABLET | Freq: Once | ORAL | Status: AC
  Administered 2023-03-12: 324 mg via ORAL
  Filled 2023-03-12: qty 4

## 2023-03-12 MED ORDER — ASPIRIN 81 MG PO CHEW: 324.0000 mg | CHEWABLE_TABLET | ORAL | Status: DC

## 2023-03-12 MED ORDER — ONDANSETRON HCL 4 MG/2ML IJ SOLN
INTRAMUSCULAR | Status: AC
  Filled 2023-03-12: qty 2

## 2023-03-12 MED ORDER — FENTANYL CITRATE (PF) 100 MCG/2ML IJ SOLN
INTRAMUSCULAR | Status: DC | PRN
  Administered 2023-03-12: 50 ug via INTRAVENOUS

## 2023-03-12 MED ORDER — FENTANYL CITRATE PF 50 MCG/ML IJ SOSY
50.0000 ug | PREFILLED_SYRINGE | Freq: Once | INTRAMUSCULAR | Status: AC
  Administered 2023-03-12: 50 ug via INTRAVENOUS
  Filled 2023-03-12: qty 1

## 2023-03-12 MED ORDER — LIDOCAINE HCL (PF) 1 % IJ SOLN
INTRAMUSCULAR | Status: DC | PRN
  Administered 2023-03-12: 5 mL

## 2023-03-12 MED ORDER — HEPARIN BOLUS VIA INFUSION
4000.0000 [IU] | Freq: Once | INTRAVENOUS | Status: AC
Start: 2023-03-12 — End: 2023-03-12
  Administered 2023-03-12: 4000 [IU] via INTRAVENOUS
  Filled 2023-03-12: qty 4000

## 2023-03-12 MED ORDER — ONDANSETRON HCL 4 MG/2ML IJ SOLN
4.0000 mg | Freq: Once | INTRAMUSCULAR | Status: AC
Start: 2023-03-12 — End: 2023-03-12
  Administered 2023-03-12: 4 mg via INTRAVENOUS
  Filled 2023-03-12: qty 2

## 2023-03-12 MED ORDER — ONDANSETRON HCL 4 MG/2ML IJ SOLN
4.0000 mg | Freq: Four times a day (QID) | INTRAMUSCULAR | Status: DC | PRN
  Administered 2023-03-12 – 2023-03-13 (×2): 4 mg via INTRAVENOUS
  Filled 2023-03-12: qty 2

## 2023-03-12 MED ORDER — ASPIRIN 81 MG PO TBEC
81.0000 mg | DELAYED_RELEASE_TABLET | Freq: Every day | ORAL | Status: DC
  Administered 2023-03-13 – 2023-03-14 (×2): 81 mg via ORAL
  Filled 2023-03-12 (×2): qty 1

## 2023-03-12 MED ORDER — NITROGLYCERIN IN D5W 200-5 MCG/ML-% IV SOLN
0.0000 ug/min | INTRAVENOUS | Status: DC
Start: 2023-03-12 — End: 2023-03-13

## 2023-03-12 MED ORDER — ATORVASTATIN CALCIUM 80 MG PO TABS
80.0000 mg | ORAL_TABLET | Freq: Every day | ORAL | Status: DC
  Administered 2023-03-12 – 2023-03-14 (×3): 80 mg via ORAL
  Filled 2023-03-12 (×3): qty 1

## 2023-03-12 MED ORDER — SODIUM CHLORIDE 0.9% FLUSH
3.0000 mL | Freq: Two times a day (BID) | INTRAVENOUS | Status: DC
  Administered 2023-03-12 – 2023-03-14 (×4): 3 mL via INTRAVENOUS

## 2023-03-12 MED ORDER — MORPHINE SULFATE (PF) 2 MG/ML IV SOLN
INTRAVENOUS | Status: AC
  Filled 2023-03-12: qty 1

## 2023-03-12 MED ORDER — ASPIRIN 300 MG RE SUPP: 300.0000 mg | RECTAL | Status: DC

## 2023-03-12 MED ORDER — VERAPAMIL HCL 2.5 MG/ML IV SOLN
INTRAVENOUS | Status: DC | PRN
  Administered 2023-03-12: 10 mL via INTRA_ARTERIAL

## 2023-03-12 MED ORDER — NICOTINE 7 MG/24HR TD PT24
7.0000 mg | MEDICATED_PATCH | Freq: Every day | TRANSDERMAL | Status: DC
  Filled 2023-03-12: qty 1

## 2023-03-12 MED ORDER — MORPHINE SULFATE (PF) 4 MG/ML IV SOLN
4.0000 mg | Freq: Once | INTRAVENOUS | Status: AC
  Administered 2023-03-12: 4 mg via INTRAVENOUS
  Filled 2023-03-12: qty 1

## 2023-03-12 MED ORDER — DAPAGLIFLOZIN PROPANEDIOL 10 MG PO TABS
10.0000 mg | ORAL_TABLET | Freq: Every day | ORAL | Status: DC
  Administered 2023-03-12 – 2023-03-14 (×3): 10 mg via ORAL
  Filled 2023-03-12 (×3): qty 1

## 2023-03-12 MED ORDER — MIDAZOLAM HCL 2 MG/2ML IJ SOLN
INTRAMUSCULAR | Status: AC
  Filled 2023-03-12: qty 2

## 2023-03-12 MED ORDER — LIDOCAINE HCL (PF) 1 % IJ SOLN
INTRAMUSCULAR | Status: AC
  Filled 2023-03-12: qty 30

## 2023-03-12 MED ORDER — ASPIRIN 81 MG PO CHEW: 81.0000 mg | CHEWABLE_TABLET | ORAL | Status: DC

## 2023-03-12 MED ORDER — CLOPIDOGREL BISULFATE 300 MG PO TABS
ORAL_TABLET | ORAL | Status: AC
  Administered 2023-03-12: 600 mg
  Filled 2023-03-12: qty 2

## 2023-03-12 MED ORDER — VERAPAMIL HCL 2.5 MG/ML IV SOLN
INTRAVENOUS | Status: AC
  Filled 2023-03-12: qty 2

## 2023-03-12 MED ORDER — ACETAMINOPHEN 325 MG PO TABS: 650.0000 mg | ORAL_TABLET | ORAL | Status: DC | PRN

## 2023-03-12 MED ORDER — PANTOPRAZOLE SODIUM 40 MG IV SOLR
40.0000 mg | Freq: Once | INTRAVENOUS | Status: AC
  Administered 2023-03-12: 40 mg via INTRAVENOUS
  Filled 2023-03-12: qty 10

## 2023-03-12 MED ORDER — MORPHINE SULFATE (PF) 2 MG/ML IV SOLN
2.0000 mg | INTRAVENOUS | Status: DC | PRN
  Administered 2023-03-12 (×2): 2 mg via INTRAVENOUS
  Filled 2023-03-12: qty 1

## 2023-03-12 MED ORDER — HEPARIN (PORCINE) 25000 UT/250ML-% IV SOLN
900.0000 [IU]/h | INTRAVENOUS | Status: DC
  Administered 2023-03-12: 900 [IU]/h via INTRAVENOUS
  Filled 2023-03-12 (×2): qty 250

## 2023-03-12 MED ORDER — ALUM & MAG HYDROXIDE-SIMETH 200-200-20 MG/5ML PO SUSP
30.0000 mL | Freq: Once | ORAL | Status: AC
  Administered 2023-03-12: 30 mL via ORAL
  Filled 2023-03-12: qty 30

## 2023-03-12 MED ORDER — IOHEXOL 350 MG/ML SOLN
INTRAVENOUS | Status: DC | PRN
  Administered 2023-03-12: 47 mL

## 2023-03-12 MED ORDER — HYDRALAZINE HCL 20 MG/ML IJ SOLN
10.0000 mg | INTRAMUSCULAR | Status: AC | PRN
Start: 2023-03-12 — End: 2023-03-12

## 2023-03-12 MED ORDER — CLOPIDOGREL BISULFATE 300 MG PO TABS
600.0000 mg | ORAL_TABLET | Freq: Once | ORAL | Status: AC
  Administered 2023-03-12: 600 mg via ORAL

## 2023-03-12 MED ORDER — HEPARIN SODIUM (PORCINE) 1000 UNIT/ML IJ SOLN
INTRAMUSCULAR | Status: DC | PRN
  Administered 2023-03-12: 4000 [IU] via INTRAVENOUS

## 2023-03-12 MED ORDER — CARVEDILOL 25 MG PO TABS
25.0000 mg | ORAL_TABLET | Freq: Two times a day (BID) | ORAL | Status: DC
  Administered 2023-03-12 – 2023-03-14 (×4): 25 mg via ORAL
  Filled 2023-03-12 (×4): qty 1

## 2023-03-12 MED ORDER — SPIRONOLACTONE 12.5 MG HALF TABLET
12.5000 mg | ORAL_TABLET | Freq: Every day | ORAL | Status: DC
  Administered 2023-03-12 – 2023-03-14 (×3): 12.5 mg via ORAL
  Filled 2023-03-12 (×3): qty 1

## 2023-03-12 MED ORDER — MIDAZOLAM HCL 2 MG/2ML IJ SOLN
INTRAMUSCULAR | Status: DC | PRN
  Administered 2023-03-12: 2 mg via INTRAVENOUS

## 2023-03-12 MED ORDER — FENTANYL CITRATE (PF) 100 MCG/2ML IJ SOLN
INTRAMUSCULAR | Status: AC
  Filled 2023-03-12: qty 2

## 2023-03-12 MED ORDER — CLOPIDOGREL BISULFATE 75 MG PO TABS
75.0000 mg | ORAL_TABLET | Freq: Every day | ORAL | Status: DC
  Administered 2023-03-13 – 2023-03-14 (×2): 75 mg via ORAL
  Filled 2023-03-12 (×2): qty 1

## 2023-03-12 MED ORDER — SODIUM CHLORIDE 0.9 % IV SOLN: 250.0000 mL | INTRAVENOUS | Status: AC | PRN

## 2023-03-12 MED ORDER — SODIUM CHLORIDE 0.9 % IV SOLN: INTRAVENOUS | Status: AC

## 2023-03-12 MED ORDER — LABETALOL HCL 5 MG/ML IV SOLN: 10.0000 mg | INTRAVENOUS | Status: AC | PRN

## 2023-03-12 MED ORDER — SODIUM CHLORIDE 0.9% FLUSH
3.0000 mL | INTRAVENOUS | Status: DC | PRN
Start: 2023-03-12 — End: 2023-03-14

## 2023-03-12 MED ORDER — SACUBITRIL-VALSARTAN 97-103 MG PO TABS
1.0000 | ORAL_TABLET | Freq: Two times a day (BID) | ORAL | Status: DC
  Administered 2023-03-12 – 2023-03-14 (×4): 1 via ORAL
  Filled 2023-03-12 (×4): qty 1

## 2023-03-12 MED ORDER — HEPARIN SODIUM (PORCINE) 1000 UNIT/ML IJ SOLN
INTRAMUSCULAR | Status: AC
Start: 2023-03-12 — End: ?
  Filled 2023-03-12: qty 10

## 2023-03-12 MED ORDER — NITROGLYCERIN IN D5W 200-5 MCG/ML-% IV SOLN
0.0000 ug/min | INTRAVENOUS | Status: DC
  Administered 2023-03-12: 5 ug/min via INTRAVENOUS
  Filled 2023-03-12: qty 250

## 2023-03-12 MED ORDER — NITROGLYCERIN IN D5W 200-5 MCG/ML-% IV SOLN: 2.0000 ug/min | INTRAVENOUS | Status: DC

## 2023-03-12 MED ORDER — HEPARIN (PORCINE) IN NACL 1000-0.9 UT/500ML-% IV SOLN
INTRAVENOUS | Status: DC | PRN
  Administered 2023-03-12 (×2): 500 mL

## 2023-03-12 SURGICAL SUPPLY — 9 items
CATH 5FR JL3.5 JR4 ANG PIG MP (CATHETERS) IMPLANT
CATH BALLN WEDGE 5F 110CM (CATHETERS) IMPLANT
DEVICE RAD COMP TR BAND LRG (VASCULAR PRODUCTS) IMPLANT
GLIDESHEATH SLEND SS 6F .021 (SHEATH) IMPLANT
GUIDEWIRE INQWIRE 1.5J.035X260 (WIRE) IMPLANT
INQWIRE 1.5J .035X260CM (WIRE) ×2
PACK CARDIAC CATHETERIZATION (CUSTOM PROCEDURE TRAY) ×1 IMPLANT
SET ATX-X65L (MISCELLANEOUS) IMPLANT
SHEATH GLIDE SLENDER 4/5FR (SHEATH) IMPLANT

## 2023-03-12 NOTE — ED Notes (Signed)
Patient Trop 159

## 2023-03-12 NOTE — Interval H&P Note (Signed)
History and Physical Interval Note:  03/12/2023 3:43 PM  Sandra Owens  has presented today for surgery, with the diagnosis of USAbe.  The various methods of treatment have been discussed with the patient and family. After consideration of risks, benefits and other options for treatment, the patient has consented to  Procedure(s): RIGHT/LEFT HEART CATH AND CORONARY ANGIOGRAPHY (N/A) as a surgical intervention.  The patient's history has been reviewed, patient examined, no change in status, stable for surgery.  I have reviewed the patient's chart and labs.  Questions were answered to the patient's satisfaction.    Cath Lab Visit (complete for each Cath Lab visit)  Clinical Evaluation Leading to the Procedure:   ACS: Yes.    Non-ACS:    Anginal Classification: CCS III  Anti-ischemic medical therapy: Minimal Therapy (1 class of medications)  Non-Invasive Test Results: No non-invasive testing performed  Prior CABG: No previous CABG        Sandra Owens

## 2023-03-12 NOTE — ED Provider Notes (Signed)
[place] EMERGENCY DEPARTMENT AT [facility] Provider Note   CSN: [id number] Arrival date & time: 03/12/23  0749     History  Chief Complaint  Patient presents with   Chest Pain   Nausea    Sandra Owens is a 57 y.o. female.  Patient is a 57 year old female with a past medical history of CAD status post stent placement, hypertension, diabetes, CHF, GERD presenting to the emergency department with chest pain.  The patient reports that 2 nights ago she was having some chest pain and she took a nitro and the pain improved.  She states that last night her pain returned and that she was unable to sleep last night due to the pain.  She states that it feels like a burning type of pain in her chest and radiates down both of her arms.  She states that she has also been having some left-sided neck and shoulder pain that she was post to see orthopedics for today.  She states that she feels nauseous but has not vomited, denies any shortness of breath.  Denies any lower extremity swelling.  She states that she took 2 nitro and Pepcid prior to arrival without any improvement.  Denies any recent hospitalization or surgery, recent long travel in car or plane, hormone use or cancer history.  Denies any blood thinner use.  The history is provided by the patient.  Chest Pain      Home Medications Prior to Admission medications   Medication Sig Start Date End Date Taking? Authorizing Provider  aspirin EC 81 MG tablet Take 1 tablet (81 mg total) by mouth daily. 11/21/14   Dunn, Tacey Ruiz, PA-C  atorvastatin (LIPITOR) 80 MG tablet TAKE 1 TABLET BY MOUTH ONCE DAILY AT  6  PM 05/02/19   Bensimhon, Bevelyn Buckles, MD  carvedilol (COREG) 25 MG tablet Take 1 tablet (25 mg total) by mouth 2 (two) times daily with a meal. 01/01/21   Iran Ouch, MD  dapagliflozin propanediol (FARXIGA) 10 MG TABS tablet Take by mouth daily.    [provider]  famotidine (PEPCID AC) 10 MG chewable  tablet Chew 10 mg by mouth daily as needed for heartburn.    [provider]  glucose blood test strip 1 each by Other route See admin instructions. Check blood sugar once daily    [provider]  JANUVIA 100 MG tablet Take 100 mg by mouth daily. 01/22/22   [provider]  nicotine (NICODERM CQ - DOSED IN MG/24 HR) 7 mg/24hr patch Place 1 patch (7 mg total) onto the skin daily. 03/25/22   Iran Ouch, MD  nitroGLYCERIN (NITROSTAT) 0.4 MG SL tablet Place 1 tablet (0.4 mg total) under the tongue every 5 (five) minutes as needed for chest pain. 07/16/21   Iran Ouch, MD  sacubitril-valsartan Sherryll Burger) [phone] MG Take 1 tablet by mouth twice daily 01/16/23   [name], MD  spironolactone (ALDACTONE) 25 MG tablet Take 1/2 (one-half) tablet by mouth once daily 08/29/20   [name], MD  tobramycin (TOBREX) 0.3 % ophthalmic solution Place 1-2 drops into the left eye as needed (for eye infection).    [provider]      Allergies    Patient has no known allergies.    Review of Systems   Review of Systems  Cardiovascular:  Positive for chest pain.    Physical Exam Updated Vital Signs BP (!) 166/86   Pulse 65   Temp  98.2 F (36.8 C) (Oral)   Resp 19   LMP 08/05/2015 (Approximate)   SpO2 95%  Physical Exam Vitals and nursing note reviewed.  Constitutional:      Appearance: She is well-developed. She is not toxic-appearing or diaphoretic.     Comments: Uncomfortable appearing  HENT:     Head: Normocephalic and atraumatic.  Eyes:     Extraocular Movements: Extraocular movements intact.  Cardiovascular:     Rate and Rhythm: Normal rate and regular rhythm.     Pulses:          Radial pulses are 2+ on the right side and 2+ on the left side.     Heart sounds: Normal heart sounds.  Pulmonary:     Effort: Pulmonary effort is normal.     Breath sounds: Normal breath sounds.  Chest:     Chest wall: No tenderness.  Abdominal:      Palpations: Abdomen is soft.     Tenderness: There is no abdominal tenderness.  Musculoskeletal:        General: Normal range of motion.     Cervical back: Normal range of motion and neck supple.     Right lower leg: No edema.     Left lower leg: No edema.  Skin:    General: Skin is warm and dry.  Neurological:     General: No focal deficit present.     Mental Status: She is alert and oriented to person, place, and time.  Psychiatric:        Mood and Affect: Mood normal.        Behavior: Behavior normal.     ED Results / Procedures / Treatments   Labs (all labs ordered are listed, but only abnormal results are displayed) Labs Reviewed  BASIC METABOLIC PANEL - Abnormal; Notable for the following components:      Result Value   Glucose, Bld 191 (*)    Creatinine, Ser 1.17 (*)    GFR, Estimated 54 (*)    All other components within normal limits  CBC - Abnormal; Notable for the following components:   RBC 6.32 (*)    MCV 70.9 (*)    MCH 22.2 (*)    RDW 17.1 (*)    All other components within normal limits  TROPONIN I (HIGH SENSITIVITY) - Abnormal; Notable for the following components:   Troponin I (High Sensitivity) 159 (*)    All other components within normal limits  BRAIN NATRIURETIC PEPTIDE  TROPONIN I (HIGH SENSITIVITY)    EKG EKG Interpretation Date/Time:  Thursday March 12 2023 07:55:25 EST Ventricular Rate:  82 PR Interval:  156 QRS Duration:  72 QT Interval:  378 QTC Calculation: 441 R Axis:   74  Text Interpretation: Normal sinus rhythm Normal ECG No significant change since last tracing Confirmed by Elayne Snare ([id number]) on 03/12/2023 8:45:20 AM  Radiology DG Chest 2 View  Result Date: 03/12/2023 CLINICAL DATA:  Chest pain and nausea EXAM: CHEST - 2 VIEW COMPARISON:  Chest radiograph dated 10/12/2015 FINDINGS: Normal lung volumes. Lingular atelectasis. Patchy left lower lobe opacity. No pleural effusion or pneumothorax. The heart size and  mediastinal contours are within normal limits. Coronary artery stent projects over the left heart. No acute osseous abnormality. IMPRESSION: Patchy left lower lobe opacity, which may represent atelectasis, aspiration, or pneumonia. Electronically Signed   By: Agustin Cree M.D.   On: 03/12/2023 09:22    Procedures .Critical Care  Performed by: Rexford Maus, DO  Authorized by: Rexford Maus, DO   Critical care provider statement:    Critical care time (minutes):  35   Critical care was necessary to treat or prevent imminent or life-threatening deterioration of the following conditions:  Cardiac failure   Critical care was time spent personally by me on the following activities:  Development of treatment plan with patient or surrogate, discussions with consultants, evaluation of patient's response to treatment, examination of patient, obtaining history from patient or surrogate, ordering and performing treatments and interventions, ordering and review of laboratory studies, ordering and review of radiographic studies, pulse oximetry, re-evaluation of patient's condition and review of old charts   I assumed direction of critical care for this patient from another provider in my specialty: no     Care discussed with: admitting provider       Medications Ordered in ED Medications  aspirin chewable tablet 324 mg (has no administration in time range)  fentaNYL (SUBLIMAZE) injection 50 mcg (50 mcg Intravenous Given 03/12/23 0926)  ondansetron (ZOFRAN) injection 4 mg (4 mg Intravenous Given 03/12/23 0926)  pantoprazole (PROTONIX) injection 40 mg (40 mg Intravenous Given 03/12/23 0926)  alum & mag hydroxide-simeth (MAALOX/MYLANTA) 200-200-20 MG/5ML suspension 30 mL (30 mLs Oral Given 03/12/23 0925)  morphine (PF) 4 MG/ML injection 4 mg (4 mg Intravenous Given 03/12/23 1017)    ED Course/ Medical Decision Making/ A&P Clinical Course as of 03/12/23 1245  Thu Mar 12, 2023  0944 CXR with possible  atelectasis vs pneumonia. [VK]  1107 Initial troponin negative. [VK]  1145 I spoke with cardiology who will evaluate the patient in the setting of high risk chest pain. [VK]  1244 Troponin is uptrending. Will be given aspirin and heparin drip. Cardioloy PA at bedside was updated. [VK]    Clinical Course User Index [VK] Rexford Maus, DO                                 Medical Decision Making This patient presents to the ED with chief complaint(s) of chest pain with pertinent past medical history of CAD status post stent placement, CHF, hypertension, diabetes, GERD which further complicates the presenting complaint. The complaint involves an extensive differential diagnosis and also carries with it a high risk of complications and morbidity.    The differential diagnosis includes ACS, arrhythmia, anemia, pneumonia, pneumothorax, pulmonary edema, pleural effusion, gastritis, GERD  Additional history obtained: Additional history obtained from N/A Records reviewed outpatient cardiology records  ED Course and Reassessment: On patient's arrival to the emergency department she is hemodynamically stable, uncomfortable appearing but not in acute distress.  EKG on arrival showed normal sinus rhythm without acute ischemic changes.  The patient has labs including troponin ordered drawn in triage as well as chest x-ray.  Patient will be given Zofran, fentanyl, Protonix and Maalox for symptomatic management and will be closely reassessed.  Independent labs interpretation:  The following labs were independently interpreted: uptrending troponin, otherwise within normal range  Independent visualization of imaging: - I independently visualized the following imaging with scope of interpretation limited to determining acute life threatening conditions related to emergency care: CXR, which revealed possible infiltrate  Consultation: - Consulted or discussed management/test interpretation w/ external  professional: cardiology  Consideration for admission or further workup: patient requires admission for NSTEMI Social Determinants of health: N/A    Amount and/or Complexity of Data Reviewed Labs: ordered. Radiology: ordered.  Risk OTC drugs.  Prescription drug management. Decision regarding hospitalization.          Final Clinical Impression(s) / ED Diagnoses Final diagnoses:  NSTEMI (non-ST elevated myocardial infarction) [facility])    Rx / DC Orders ED Discharge Orders     None         Rexford Maus, DO 03/12/23 1250

## 2023-03-12 NOTE — Progress Notes (Signed)
TR BAND REMOVAL  LOCATION:     Right radial  DEFLATED PER PROTOCOL:    Yes.    TIME BAND OFF / DRESSING APPLIED:    1900pm a clean dry dressing applied with gauze and tegaderm   SITE UPON ARRIVAL:    Level 0  SITE AFTER BAND REMOVAL:    Level 0  CIRCULATION SENSATION AND MOVEMENT:    Within Normal Limits   Yes.    COMMENTS:   Care instruction given to patient.

## 2023-03-12 NOTE — ED Triage Notes (Addendum)
Pt. Stated, Chest pain this morning with nausea. This started this morning. I took  2 Nitroglycerine and they did not help. It feels like Im having a heart attack.

## 2023-03-12 NOTE — Progress Notes (Signed)
PHARMACY - ANTICOAGULATION CONSULT NOTE  Pharmacy Consult for heparin  Indication: chest pain/ACS  No Known Allergies  Patient Measurements:   Heparin Dosing Weight: 72.1kg   Vital Signs: Temp: 98.2 F (36.8 C) (11/07 1210) Temp Source: Oral (11/07 1210) BP: 152/94 (11/07 1200) Pulse Rate: 65 (11/07 1200)  Labs: Recent Labs    03/12/23 0826 03/12/23 1037  HGB 14.0  --   HCT 44.8  --   PLT 310  --   CREATININE 1.17*  --   TROPONINIHS 10 159*    CrCl cannot be calculated (Unknown ideal weight.).   Medical History: Past Medical History:  Diagnosis Date   Coronary artery disease    Essential hypertension    GERD (gastroesophageal reflux disease)    Hyperlipidemia    Low back pain    Systolic CHF (HCC)    a. Found to have new low EF 11/2014 with abnormal nuc.   Type 2 diabetes mellitus (HCC)    Assessment: Patient admitted with CC of chest pain. Trop elevated to 159. Hx of NSTEMI. Patient is not anticoagulation prior to admission. HgB 14.0 and PLTS 310. Pharmacy consult to dose heparin.   Goal of Therapy:  Heparin level 0.3-0.7 units/ml Monitor platelets by anticoagulation protocol: Yes   Plan:  Give 4000 units bolus x 1 Start heparin infusion at 900 units/hr Check anti-Xa level in 6 hours and daily while on heparin Continue to monitor H&H and platelets  Estill Batten, PharmD, BCCCP  03/12/2023,12:37 PM

## 2023-03-12 NOTE — H&P (Signed)
Cardiology Admission History and Physical   Patient ID: Sandra Owens MRN: [id number]; DOB: 07-May-1965   Admission date: 03/12/2023  PCP:  Merri Brunette, MD   [facility] Providers Cardiologist:  [name], MD        Chief Complaint:  chest pain  Patient Profile:   Sandra Owens is a 57 y.o. female with CAD with prior PCI, chronic systolic and diastolic heart failure with improved LVEF, HTN, HLD, DM, anxiety/stress, and tobacco abuse who is being seen 03/12/2023 for the evaluation of chest pain.  History of Present Illness:   Sandra Owens has a history of HFrEF and CAD. Echo in 2016 with LVEF 25-30%. Follow up nuclear stress test showed a prior MI and peri-infarct ischemia. She proceeded to heart catheterization 11/2014 showed mild nonobstructive CAD - medical therapy recommended for presumed NICM. Echo 2019 showed LVEF 15-20%.  Repeat heart cath 10/2015 showed progression in CAD with 90% in mid LAD and resulted in DES 3.0 x 32 mm to mid LAD.  Procedure was complicated by cardiogenic shock during PCI due to a jailed septal branch. Echo 01/2016 with LVEF improved to 45-50%, this further improved to 60-65% on echo 12/2017.   She follows with Dr. Kirke Corin 12/2020 for claudication. Through shared decision making, medical therapy was pursued.   Last echo 01/2023 showed LVEF 45-50% and nuclear stress test was planned but not yet completed. A home sleep study was also planned.   GDMT: coreg, entresto, spironolactone, farxiga, and ASA  She presented to [facility] with chest pain that was not improved with NTG. During my exam, she describes intermittent chest pain over the last three weeks that she thought were related to panic attacks, no diaphoresis. Episodes lasted less than 10 min and she did not take NTG. On Tues, she developed chest pain associated with diaphoresis that resolved completely with nitroglycerin x 1. This was before work and she was able to work  her shift and sleep later that day (she works a physically demanding job).  Last night, she was still awake at 2AM (works second shift) and developed chest pain in her left chest that radiated down her left arm and to her back. This was associated with diaphoresis and she was unable to sleep. She had no CP at work earlier that evening.  She took NTG x 2 without relief prompting ER evaluation. She has not had resolution of her chest pain since being in the ER.   While in the room, second troponin resulted at 159 (8).    Past Medical History:  Diagnosis Date   Coronary artery disease    Essential hypertension    GERD (gastroesophageal reflux disease)    Hyperlipidemia    Low back pain    Systolic CHF (HCC)    a. Found to have new low EF 11/2014 with abnormal nuc.   Type 2 diabetes mellitus (HCC)     Past Surgical History:  Procedure Laterality Date   CARDIAC CATHETERIZATION N/A 11/23/2014   Procedure: Right/Left Heart Cath and Coronary Angiography;  Surgeon: Corky Crafts, MD;  Location: [facility] INVASIVE CV LAB;  Service: Cardiovascular;  Laterality: N/A;   CARDIAC CATHETERIZATION N/A 10/15/2015   Procedure: Right/Left Heart Cath and Coronary Angiography;  Surgeon: Lyn Records, MD;  Location: [facility] INVASIVE CV LAB;  Service: Cardiovascular;  Laterality: N/A;   CARDIAC CATHETERIZATION N/A 10/17/2015   Procedure: Coronary Stent Intervention;  Surgeon: Lyn Records, MD;  Location: [facility] INVASIVE CV LAB;  Service: Cardiovascular;  Laterality: N/A;   CORONARY ANGIOPLASTY     TUBAL LIGATION       Medications Prior to Admission: Prior to Admission medications   Medication Sig Start Date End Date Taking? Authorizing Provider  aspirin EC 81 MG tablet Take 1 tablet (81 mg total) by mouth daily. 11/21/14   Dunn, Tacey Ruiz, PA-C  atorvastatin (LIPITOR) 80 MG tablet TAKE 1 TABLET BY MOUTH ONCE DAILY AT  6  PM 05/02/19   Bensimhon, Bevelyn Buckles, MD  carvedilol (COREG) 25 MG tablet Take 1 tablet (25 mg total) by  mouth 2 (two) times daily with a meal. 01/01/21   Iran Ouch, MD  dapagliflozin propanediol (FARXIGA) 10 MG TABS tablet Take by mouth daily.    [provider]  famotidine (PEPCID AC) 10 MG chewable tablet Chew 10 mg by mouth daily as needed for heartburn.    [provider]  glucose blood test strip 1 each by Other route See admin instructions. Check blood sugar once daily    [provider]  JANUVIA 100 MG tablet Take 100 mg by mouth daily. 01/22/22   [provider]  nicotine (NICODERM CQ - DOSED IN MG/24 HR) 7 mg/24hr patch Place 1 patch (7 mg total) onto the skin daily. 03/25/22   Iran Ouch, MD  nitroGLYCERIN (NITROSTAT) 0.4 MG SL tablet Place 1 tablet (0.4 mg total) under the tongue every 5 (five) minutes as needed for chest pain. 07/16/21   Iran Ouch, MD  sacubitril-valsartan Sherryll Burger) [phone] MG Take 1 tablet by mouth twice daily 01/16/23   [name], MD  spironolactone (ALDACTONE) 25 MG tablet Take 1/2 (one-half) tablet by mouth once daily 08/29/20   [name], MD  tobramycin (TOBREX) 0.3 % ophthalmic solution Place 1-2 drops into the left eye as needed (for eye infection).    [provider]     Allergies:   No Known Allergies  Social History:   Social History   Socioeconomic History   Marital status: Married    Spouse name: Not on file   Number of children: 3   Years of education: Not on file   Highest education level: Not on file  Occupational History   Not on file  Tobacco Use   Smoking status: Every Day    Current packs/day: 0.00    Average packs/day: 0.3 packs/day for 30.0 years (7.5 ttl pk-yrs)    Types: Cigarettes    Start date: 10/11/1985    Last attempt to quit: 10/12/2015    Years since quitting: 7.4   Smokeless tobacco: Never  Vaping Use   Vaping status: Never Used  Substance and Sexual Activity   Alcohol use: No    Alcohol/week: 0.0 standard drinks of alcohol   Drug use: No    Sexual activity: Not on file  Other Topics Concern   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     Social Determinants of Health   Financial Resource Strain: Not on file  Food Insecurity: Not on file  Transportation Needs: Not on file  Physical Activity: Not on file  Stress: Not on file  Social Connections: Not on file  Intimate Partner Violence: Not on file    Family History:   The patient's family history includes CAD in her father; Diabetes Mellitus II in her father and mother; Hypertension in her father and mother; Stroke in her paternal grandmother.  ROS:  Please see the history of present illness.  All other ROS reviewed and negative.     Physical Exam/Data:   Vitals:   03/12/23 1200 03/12/23 1210 03/12/23 1230 03/12/23 1250  BP: (!) 152/94  (!) 166/86   Pulse: 65     Resp: 13  19   Temp:  98.2 F (36.8 C)    TempSrc:  Oral    SpO2: 95%     Weight:    80.7 kg  Height:    5\' 4"  (1.626 m)   No intake or output data in the 24 hours ending 03/12/23 1256    03/12/2023   12:50 PM 01/02/2023   10:49 AM 03/25/2022    9:51 AM  Last 3 Weights  Weight (lbs) 178 lb 174 lb 188 lb 3.2 oz  Weight (kg) 80.74 kg 78.926 kg 85.367 kg     Body mass index is 30.55 kg/m.  General:  Well nourished, well developed, in no acute distress HEENT: normal Neck: no JVD Vascular: No carotid bruits; Distal pulses 2+ bilaterally   Cardiac:  normal S1, S2; RRR; no murmur  Lungs:  clear to auscultation bilaterally, no wheezing, rhonchi or rales  Abd: soft, nontender, no hepatomegaly  Ext: no edema Musculoskeletal:  No deformities, BUE and BLE strength normal and equal Skin: warm and dry  Neuro:  CNs 2-12 intact, no focal abnormalities noted Psych:  Normal affect    EKG:  The ECG that was done was personally reviewed and demonstrates sinus rhythm with HR 82  Relevant CV Studies:  Echo 01/2023: 1. The images are suboptimal, but there  appears to be apical hypokinesis.  Left ventricular ejection fraction, by estimation, is 45 to 50%. Left  ventricular ejection fraction by 3D volume is 51 %. The left ventricle has  mildly decreased function. The  left ventricle demonstrates regional wall motion abnormalities (see  scoring diagram/findings for description). Left ventricular diastolic  parameters are consistent with Grade I diastolic dysfunction (impaired  relaxation).   2. Right ventricular systolic function is normal. The right ventricular  size is normal.   3. The mitral valve is normal in structure. No evidence of mitral valve  regurgitation. No evidence of mitral stenosis.   4. The aortic valve is bicuspid. Aortic valve regurgitation is not  visualized. No aortic stenosis is present.   5. The inferior vena cava is normal in size with greater than 50%  respiratory variability, suggesting right atrial pressure of 3 mmHg.   Laboratory Data:  High Sensitivity Troponin:   Recent Labs  Lab 03/12/23 0826 03/12/23 1037  TROPONINIHS 10 159*      Chemistry Recent Labs  Lab 03/12/23 0826  NA 143  K 4.1  CL 109  CO2 24  GLUCOSE 191*  BUN 12  CREATININE 1.17*  CALCIUM 9.6  GFRNONAA 54*  ANIONGAP 10    No results for input(s): "PROT", "ALBUMIN", "AST", "ALT", "ALKPHOS", "BILITOT" in the last 168 hours. Lipids No results for input(s): "CHOL", "TRIG", "HDL", "LABVLDL", "LDLCALC", "CHOLHDL" in the last 168 hours. Hematology Recent Labs  Lab 03/12/23 0826  WBC 5.5  RBC 6.32*  HGB 14.0  HCT 44.8  MCV 70.9*  MCH 22.2*  MCHC 31.3  RDW 17.1*  PLT 310   Thyroid No results for input(s): "TSH", "FREET4" in the last 168 hours. BNP Recent Labs  Lab 03/12/23 1037  BNP 69.6    DDimer No results for input(s): "DDIMER" in the last 168 hours.   Radiology/Studies:  DG Chest 2 View  Result Date: 03/12/2023 CLINICAL DATA:  Chest pain and nausea EXAM: CHEST - 2 VIEW COMPARISON:  Chest radiograph dated 10/12/2015  FINDINGS: Normal lung volumes. Lingular atelectasis. Patchy left lower lobe opacity. No pleural effusion or pneumothorax. The heart size and mediastinal contours are within normal limits. Coronary artery stent projects over the left heart. No acute osseous abnormality. IMPRESSION: Patchy left lower lobe opacity, which may represent atelectasis, aspiration, or pneumonia. Electronically Signed   By: Agustin Cree M.D.   On: 03/12/2023 09:22     Assessment and Plan:   Chest pain - hs troponin 10 --> 159 - sCr 1.17 - near baseline - EKG reassuring - given known CAD, recently reduced EF, and ongoing chest pain previously responsive to nitroglycerin, I suspect she will need an ischemic evaluation - favor repeat angiography - given ongoing CP and troponin elevation, will start heparin gtt and nitroglycerin gtt   CAD - prior PCI to mLAD 2017 - has been maintained on ASA, coreg, and 80 mg lipitor - heparin and NTG as above   Chronic systolic and diastolic heart failure - LVEF as low as 15-20% in 2017 but improved after PCI and with GDMT to normal in 2019  - most recent echo with LVEF 45-50% - continue coreg 25 mg BID, 10 mg farxiga, 97-103 mg entresto BID, 12.5 mg spironolactone - does not appear volume up on exam - she last took medications last evening when she got off work (midnight), has had no medications since midnight   Hyperlipidemia with LDL goal < 70 - update lipid panel - continue 80 mg lipitor - collect LP(a)   PAD - right SFA occluded - found on sheath introduction during heart cath 2017   Will admit to cardiology service with plans for heart catheterization today.  The patient understands that risks included but are not limited to stroke (1 in 1000), death (1 in 1000), kidney failure [usually temporary] (1 in 500), bleeding (1 in 200), allergic reaction [possibly serious] (1 in 200).      Risk Assessment/Risk Scores:    TIMI Risk Score for Unstable Angina or Non-ST  Elevation MI:   The patient's TIMI risk score is 5, which indicates a 26% risk of all cause mortality, new or recurrent myocardial infarction or need for urgent revascularization in the next 14 days.      Code Status: Full Code  Severity of Illness: The appropriate patient status for this patient is INPATIENT. Inpatient status is judged to be reasonable and necessary in order to provide the required intensity of service to ensure the patient's safety. The patient's presenting symptoms, physical exam findings, and initial radiographic and laboratory data in the context of their chronic comorbidities is felt to place them at high risk for further clinical deterioration. Furthermore, it is not anticipated that the patient will be medically stable for discharge from the hospital within 2 midnights of admission.   * I certify that at the point of admission it is my clinical judgment that the patient will require inpatient hospital care spanning beyond 2 midnights from the point of admission due to high intensity of service, high risk for further deterioration and high frequency of surveillance required.*   For questions or updates, please contact [facility] Please consult [email or web address] for contact info under     Signed, Marcelino Duster, PA  03/12/2023 12:56 PM

## 2023-03-13 ENCOUNTER — Inpatient Hospital Stay (HOSPITAL_COMMUNITY): Payer: 59

## 2023-03-13 ENCOUNTER — Encounter (HOSPITAL_COMMUNITY): Payer: Self-pay | Admitting: Cardiovascular Disease

## 2023-03-13 DIAGNOSIS — I200 Unstable angina: Secondary | ICD-10-CM | POA: Diagnosis not present

## 2023-03-13 DIAGNOSIS — R079 Chest pain, unspecified: Secondary | ICD-10-CM | POA: Diagnosis not present

## 2023-03-13 DIAGNOSIS — I10 Essential (primary) hypertension: Secondary | ICD-10-CM | POA: Diagnosis not present

## 2023-03-13 DIAGNOSIS — Z720 Tobacco use: Secondary | ICD-10-CM | POA: Diagnosis not present

## 2023-03-13 DIAGNOSIS — E7800 Pure hypercholesterolemia, unspecified: Secondary | ICD-10-CM | POA: Diagnosis not present

## 2023-03-13 LAB — BASIC METABOLIC PANEL
Anion gap: 10 (ref 5–15)
BUN: 12 mg/dL (ref 6–20)
CO2: 22 mmol/L (ref 22–32)
Calcium: 9.3 mg/dL (ref 8.9–10.3)
Chloride: 105 mmol/L (ref 98–111)
Creatinine, Ser: 1.06 mg/dL — ABNORMAL HIGH (ref 0.44–1.00)
GFR, Estimated: >60 mL/min (ref >60)
Glucose, Bld: 205 mg/dL — ABNORMAL HIGH (ref 70–99)
Potassium: 3.8 mmol/L (ref 3.5–5.1)
Sodium: 137 mmol/L (ref 135–145)

## 2023-03-13 LAB — ECHOCARDIOGRAM LIMITED
Area-P 1/2: 4.4 cm2
Calc EF: 46.2 %
Height: 64 in
S' Lateral: 3.4 cm
Single Plane A2C EF: 50.7 %
Single Plane A4C EF: 40.5 %
Weight: 2772.8 [oz_av]

## 2023-03-13 LAB — CBC
HCT: 46.4 % — ABNORMAL HIGH (ref 36.0–46.0)
Hemoglobin: 14.9 g/dL (ref 12.0–15.0)
MCH: 22.4 pg — ABNORMAL LOW (ref 26.0–34.0)
MCHC: 32.1 g/dL (ref 30.0–36.0)
MCV: 69.7 fL — ABNORMAL LOW (ref 80.0–100.0)
Platelets: 288 10*3/uL (ref 150–400)
RBC: 6.66 MIL/uL — ABNORMAL HIGH (ref 3.87–5.11)
RDW: 16.9 % — ABNORMAL HIGH (ref 11.5–15.5)
WBC: 8.7 10*3/uL (ref 4.0–10.5)
nRBC: 0 % (ref 0.0–0.2)

## 2023-03-13 LAB — LIPID PANEL
Cholesterol: 208 mg/dL — ABNORMAL HIGH (ref 0–200)
HDL: 40 mg/dL — ABNORMAL LOW (ref >40)
LDL Cholesterol: 138 mg/dL — ABNORMAL HIGH (ref 0–99)
Total CHOL/HDL Ratio: 5.2 {ratio}
Triglycerides: 148 mg/dL (ref <150)
VLDL: 30 mg/dL (ref 0–40)

## 2023-03-13 LAB — GLUCOSE, CAPILLARY: Glucose-Capillary: 224 mg/dL — ABNORMAL HIGH (ref 70–99)

## 2023-03-13 MED ORDER — FAMOTIDINE 20 MG PO TABS
10.0000 mg | ORAL_TABLET | Freq: Once | ORAL | Status: AC
  Administered 2023-03-13: 10 mg via ORAL
  Filled 2023-03-13: qty 1

## 2023-03-13 MED ORDER — NICOTINE 14 MG/24HR TD PT24
14.0000 mg | MEDICATED_PATCH | Freq: Every day | TRANSDERMAL | Status: DC
Start: 2023-03-14 — End: 2023-03-14
  Administered 2023-03-14: 14 mg via TRANSDERMAL
  Filled 2023-03-13: qty 1

## 2023-03-13 MED ORDER — ALUM & MAG HYDROXIDE-SIMETH 200-200-20 MG/5ML PO SUSP
30.0000 mL | ORAL | Status: DC | PRN
  Administered 2023-03-13: 30 mL via ORAL
  Filled 2023-03-13: qty 30

## 2023-03-13 MED ORDER — ISOSORBIDE MONONITRATE ER 30 MG PO TB24
30.0000 mg | ORAL_TABLET | Freq: Every day | ORAL | Status: DC
  Administered 2023-03-13 – 2023-03-14 (×2): 30 mg via ORAL
  Filled 2023-03-13 (×2): qty 1

## 2023-03-13 MED ORDER — PERFLUTREN LIPID MICROSPHERE
1.0000 mL | INTRAVENOUS | Status: AC | PRN
Start: 2023-03-13 — End: 2023-03-13
  Administered 2023-03-13: 2 mL via INTRAVENOUS

## 2023-03-13 MED FILL — Verapamil HCl IV Soln 2.5 MG/ML: INTRAVENOUS | Qty: 2 | Status: AC

## 2023-03-13 NOTE — Plan of Care (Signed)

## 2023-03-13 NOTE — Progress Notes (Signed)
Patient Name: Sandra Owens Date of Encounter: 03/13/2023 [facility] Cardiologist: [name], MD   Interval Summary  .    Patient reports improvement in central chest discomfort from yesterday but is still having 5/10 discomfort. Nitroglycerin infusion rate increased by nursing staff just prior to my exam and patient yet to have improvement in pain. Denies dyspnea. She also has some nausea. A secondary concern for her this morning is left shoulder/arm pain (chronic for several weeks). She had an appointment for evaluation of this yesterday that had to be canceled due to being in the ED.   Vital Signs .    Vitals:   03/12/23 1845 03/12/23 2025 03/12/23 2334 03/13/23 0339  BP: (!) 165/91 (!) 147/76 (!) 150/75 131/80  Pulse: 70 88 91 91  Resp: (!) 22 18 16 17   Temp:  97.9 F (36.6 C) 98.4 F (36.9 C) 98.1 F (36.7 C)  TempSrc:  Oral Oral Oral  SpO2: (!) 88% 92% 94% 92%  Weight:    78.6 kg  Height:        Intake/Output Summary (Last 24 hours) at 03/13/2023 0815 Last data filed at 03/13/2023 0341 Gross per 24 hour  Intake 967.35 ml  Output --  Net 967.35 ml      03/13/2023    3:39 AM 03/12/2023   12:50 PM 01/02/2023   10:49 AM  Last 3 Weights  Weight (lbs) 173 lb 4.8 oz 178 lb 174 lb  Weight (kg) 78.608 kg 80.74 kg 78.926 kg      Telemetry/ECG    Sinus rhythm with isolated PVCs - Personally Reviewed  Physical Exam .   GEN: No acute distress.   Neck: No JVD Cardiac: RRR, no murmurs, rubs, or gallops.  Respiratory: Clear to auscultation bilaterally. GI: Soft, nontender, non-distended  MS: No edema  Assessment & Plan .     Chest pain CAD  Patient with prior PCI to mLAD in 2017 presented to the ED yesterday with chest pain first intermittent 3 weeks ago but progressing to continuous. Hs troponin 10 --> 159 and with history of CAD, she was started on heparin/nitroglycerin with plans for LHC. This found distal RCA with total occlusion just  before the takeoff of the small caliber PDA (1.75 mm vessel). The PDA noted to be filling from left to right collaterals. No options for PCI given the small size of the PDA.  Continue IV nitroglycerin with ongoing chest pain. ECG today with inferior lead TWI consistent with evolving distal RCA infarction. PRN Morphine for chest pain ASA 81mg  Plavix 75mg  (loaded yesterday) Coreg 25mg  BID  Hyperlipidemia  Continue Atorvastatin 80mg . Patient states she had just resumed taking this about a week prior to admission.   Lab Results  Component Value Date   CHOL 208 (H) 03/13/2023   HDL 40 (L) 03/13/2023   LDLCALC 138 (H) 03/13/2023   TRIG 148 03/13/2023   CHOLHDL 5.2 03/13/2023   Chronic systolic and diastolic heart failure LVEF as low as 15-20% in 2017 but improved after PCI and with GDMT to normal in 2019. Most recent echo in September 2024 with LVEF 45-50%.   Continue coreg 25 mg BID, 10 mg farxiga, 97-103 mg entresto BID, 12.5 mg spironolactone Repeat limited echocardiogram today to assess LVEF in the setting of distal RCA infarction  PAD Right SFA occluded - found on sheath introduction during heart cath 2017.   For questions or updates, please contact [facility] Please consult [email or web address] for contact  info under        Signed, Perlie Gold, PA-C

## 2023-03-13 NOTE — Progress Notes (Signed)
  Echocardiogram 2D Echocardiogram has been performed.  Sandra Owens 03/13/2023, 2:34 PM

## 2023-03-13 NOTE — Inpatient Diabetes Management (Signed)
Inpatient Diabetes Program Recommendations  AACE/ADA: New Consensus Statement on Inpatient Glycemic Control (2015)  Target Ranges:  Prepandial:   less than 140 mg/dL      Peak postprandial:   less than 180 mg/dL (1-2 hours)      Critically ill patients:  140 - 180 mg/dL   Lab Results  Component Value Date   GLUCAP 224 (H) 03/13/2023   HGBA1C 7.2 (H) 03/12/2023    Review of Glycemic Control  Latest Reference Range & Units 03/13/23 07:25  Glucose-Capillary 70 - 99 mg/dL 951 (H)   Diabetes history: DM 2 Outpatient Diabetes medications: Farxiga 10 mg Daily, Rybelsus 9 mg Daily Current orders for Inpatient glycemic control:  Farxiga 10 mg Daily  Inpatient Diabetes Program Recommendations:    Note: Hx of diabetes and glucose trends in the 200 range.   -   Add Novolog 0-15 units tid + hs  Thanks,  Christena Deem RN, MSN, BC-ADM Inpatient Diabetes Coordinator Team Pager [phone] (8a-5p)

## 2023-03-13 NOTE — Plan of Care (Signed)
  Problem: Education: Goal: Knowledge of General Education information will improve Description: Including pain rating scale, medication(s)/side effects and non-pharmacologic comfort measures Outcome: Progressing   Problem: Health Behavior/Discharge Planning: Goal: Ability to manage health-related needs will improve Outcome: Progressing   Problem: Clinical Measurements: Goal: Ability to maintain clinical measurements within normal limits will improve Outcome: Progressing Goal: Will remain free from infection Outcome: Progressing Goal: Diagnostic test results will improve Outcome: Progressing Goal: Respiratory complications will improve Outcome: Progressing Goal: Cardiovascular complication will be avoided Outcome: Progressing   Problem: Activity: Goal: Risk for activity intolerance will decrease Outcome: Progressing   Problem: Nutrition: Goal: Adequate nutrition will be maintained Outcome: Progressing   Problem: Coping: Goal: Level of anxiety will decrease Outcome: Progressing   Problem: Elimination: Goal: Will not experience complications related to bowel motility Outcome: Progressing Goal: Will not experience complications related to urinary retention Outcome: Progressing   Problem: Pain Management: Goal: General experience of comfort will improve Outcome: Progressing   Problem: Safety: Goal: Ability to remain free from injury will improve Outcome: Progressing   Problem: Skin Integrity: Goal: Risk for impaired skin integrity will decrease Outcome: Progressing   Problem: Education: Goal: Understanding of cardiac disease, CV risk reduction, and recovery process will improve Outcome: Progressing Goal: Individualized Educational Video(s) Outcome: Progressing   Problem: Activity: Goal: Ability to tolerate increased activity will improve Outcome: Progressing   Problem: Cardiac: Goal: Ability to achieve and maintain adequate cardiovascular perfusion will  improve Outcome: Progressing   Problem: Health Behavior/Discharge Planning: Goal: Ability to safely manage health-related needs after discharge will improve Outcome: Progressing   Problem: Education: Goal: Understanding of CV disease, CV risk reduction, and recovery process will improve Outcome: Progressing Goal: Individualized Educational Video(s) Outcome: Progressing   Problem: Activity: Goal: Ability to return to baseline activity level will improve Outcome: Progressing   Problem: Cardiovascular: Goal: Ability to achieve and maintain adequate cardiovascular perfusion will improve Outcome: Progressing Goal: Vascular access site(s) Level 0-1 will be maintained Outcome: Progressing   Problem: Health Behavior/Discharge Planning: Goal: Ability to safely manage health-related needs after discharge will improve Outcome: Progressing

## 2023-03-13 NOTE — Plan of Care (Signed)
Patient remains on MCH-6E at time of writing. Patient is no longer receiving infusion of NGL and denies CP or SHOB. No acute changes overnight.   Problem: Education: Goal: Knowledge of General Education information will improve Description: Including pain rating scale, medication(s)/side effects and non-pharmacologic comfort measures Outcome: Progressing   Problem: Health Behavior/Discharge Planning: Goal: Ability to manage health-related needs will improve Outcome: Progressing   Problem: Clinical Measurements: Goal: Ability to maintain clinical measurements within normal limits will improve Outcome: Progressing Goal: Will remain free from infection Outcome: Progressing Goal: Diagnostic test results will improve Outcome: Progressing Goal: Respiratory complications will improve Outcome: Progressing Goal: Cardiovascular complication will be avoided Outcome: Progressing   Problem: Activity: Goal: Risk for activity intolerance will decrease Outcome: Progressing   Problem: Nutrition: Goal: Adequate nutrition will be maintained Outcome: Progressing   Problem: Coping: Goal: Level of anxiety will decrease Outcome: Progressing   Problem: Elimination: Goal: Will not experience complications related to bowel motility Outcome: Progressing Goal: Will not experience complications related to urinary retention Outcome: Progressing   Problem: Pain Management: Goal: General experience of comfort will improve Outcome: Progressing   Problem: Safety: Goal: Ability to remain free from injury will improve Outcome: Progressing   Problem: Skin Integrity: Goal: Risk for impaired skin integrity will decrease Outcome: Progressing   Problem: Education: Goal: Understanding of cardiac disease, CV risk reduction, and recovery process will improve Outcome: Progressing Goal: Individualized Educational Video(s) Outcome: Progressing   Problem: Activity: Goal: Ability to tolerate increased  activity will improve Outcome: Progressing   Problem: Cardiac: Goal: Ability to achieve and maintain adequate cardiovascular perfusion will improve Outcome: Progressing   Problem: Health Behavior/Discharge Planning: Goal: Ability to safely manage health-related needs after discharge will improve Outcome: Progressing   Problem: Education: Goal: Understanding of CV disease, CV risk reduction, and recovery process will improve Outcome: Progressing Goal: Individualized Educational Video(s) Outcome: Progressing   Problem: Activity: Goal: Ability to return to baseline activity level will improve Outcome: Progressing   Problem: Cardiovascular: Goal: Ability to achieve and maintain adequate cardiovascular perfusion will improve Outcome: Progressing Goal: Vascular access site(s) Level 0-1 will be maintained Outcome: Progressing   Problem: Health Behavior/Discharge Planning: Goal: Ability to safely manage health-related needs after discharge will improve Outcome: Progressing

## 2023-03-13 NOTE — Discharge Summary (Incomplete)
Discharge Summary    Patient ID: Sandra Owens MRN: [id number]; DOB: 1965-10-22  Admit date: 03/12/2023 Discharge date: 03/14/2023  PCP:  Merri Brunette, MD   [facility] Providers Cardiologist:  [name], MD   Discharge Diagnoses    Principal Problem:   Unstable angina [facility]) Active Problems:   Primary hypertension   Tobacco abuse   Primary hypercholesterolemia   Diagnostic Studies/Procedures    03/12/23 LHC/RHC    Dist RCA lesion is 100% stenosed.   Mid RCA lesion is 30% stenosed.   Prox Cx to Mid Cx lesion is 50% stenosed.   Mid LAD lesion is 10% stenosed.   1st Mrg lesion is 40% stenosed.   Patent mid LAD stent without restenosis Moderate mid Circumflex stenosis-this does not appear to be flow limiting Dominant RCA with mild disease in the mid vessel. Occlusion of the distal RCA just before the takeoff of the small caliber PDA. The PDA is seen to fill from left to right collaterals (likely culprit vessel) Normal right and left heart pressures.   Recommendations: Medical management of her CAD. Her infarct vessel is the distal RCA with total occlusion just before the takeoff of the small caliber PDA (1.75 mm vessel). The PDA is filling from left to right collaterals. I will load her with Plavix tonight and would recommend DAPT with ASA/Plavix for one year. No options for PCI given the small size of the PDA.  Diagnostic Dominance: Right  _____________   Echo 03/13/23: 1. Hypokinesis of the inferior and mid septal walls Endocardium is  difficult to see well. Would consider limited echo with Definity to  further define wall motion, LV systolic function.. Left ventricular  ejection fraction, by estimation, is 50 to 55%.  The left ventricle has low normal function.   2. Right ventricular systolic function is normal. The right ventricular  size is normal.   3. Left atrial size was grossly normal.   4. The mitral valve is normal in  structure. Trivial mitral valve  regurgitation.   5. The aortic valve has an indeterminant number of cusps. Aortic valve  sclerosis is present, with no evidence of aortic valve stenosis.   6. The inferior vena cava is normal in size with greater than 50%  respiratory variability, suggesting right atrial pressure of 3 mmHg.    History of Present Illness     [name] is a 57 y.o. female with CAD with prior PCI, chronic systolic and diastolic heart failure with improved LVEF, HTN, HLD, DM, anxiety/stress, and tobacco abuse.   She presented to [facility] with chest pain that was not improved with NTG. During our initial evaluation in the ED, she describes intermittent chest pain over the last three weeks that she thought were related to panic attacks, no diaphoresis. Episodes lasted less than 10 min and she did not take NTG. On Tues, she developed chest pain associated with diaphoresis that resolved completely with nitroglycerin x 1. This was before work and she was able to work her shift and sleep later that day (she works a physically demanding job).  The night before coming to the ED, she was still awake at 2AM (works second shift) and developed chest pain in her left chest that radiated down her left arm and to her back. This was associated with diaphoresis and she was unable to sleep. She had no CP at work earlier that evening.  She took NTG x 2 without relief prompting ER evaluation. Pain continued  upon arrival to the ED.   Hospital Course     Consultants: N/A   Chest pain CAD Patient with prior PCI to mLAD in 2017 presented to the ED yesterday with chest pain first intermittent 3 weeks ago but progressing to continuous. Hs troponin 10 --> 159 and with history of CAD, she was started on heparin/nitroglycerin with plans for LHC. This found distal RCA with total occlusion just before the takeoff of the small caliber PDA (1.75 mm vessel). The PDA noted to be filling from left to right  collaterals. No options for PCI given the small size of the PDA. Due to continued chest pain, 30 mg imdur added yesterday.  Continue ASA and plavix x 12 months for ACS presentation.   She has ambulated today without chest pain, her blood pressure remained stable and she is encouraged to spread out medication administration is much as possible.   Chronic systolic and diastolic heart failure - LVEF as low as 15-20% in 2017 but improved after PCI and with GDMT to normal in 2019  - most recent echo in September 2024 with LVEF 45-50% - echo this admission with LVEF 50-55%, does not appear volume up on exam - continue coreg 25 mg BID, 10 mg farxiga, 97-103 mg entresto BID, 12.5 mg spironolactone - no pressure room to increase spironolactone   Hypertension  Managed in the context of CHF, as above   Hyperlipidemia with LDL goal < 55 Consider lower goal given smoking history and PAD 03/13/2023: Cholesterol 208; HDL 40; LDL Cholesterol 138; Triglycerides 148; VLDL 30 LP(a) pending she was taking 80 mg lipitor prior to admission, f/u as outpt   PAD - right SFA occluded - found on sheath introduction during heart cath 2017     Pt seen and examined by Dr Rennis Golden and deemed stable for discharge. Follow up has been arranged.     Did the patient have an acute coronary syndrome (MI, NSTEMI, STEMI, etc) this admission?:  Yes                               AHA/ACC ACS Clinical Performance & Quality Measures: Aspirin prescribed? - Yes ADP Receptor Inhibitor (Plavix/Clopidogrel, Brilinta/Ticagrelor or Effient/Prasugrel) prescribed (includes medically managed patients)? - Yes Beta Blocker prescribed? - Yes High Intensity Statin (Lipitor 40-80mg  or Crestor 20-40mg ) prescribed? - Yes EF assessed during THIS hospitalization? - Yes For EF <40%, was ACEI/ARB prescribed? - Yes For EF <40%, Aldosterone Antagonist (Spironolactone or Eplerenone) prescribed? - Yes Cardiac Rehab Phase II ordered (including  medically managed patients)? - Yes       The patient will be scheduled for a TOC follow up appointment in 7-14 days.  A message has been sent to the [facility] and Scheduling Pool at the office where the patient should be seen for follow up.  _____________  Discharge Vitals Blood pressure (!) 102/58, pulse 95, temperature 98.3 F (36.8 C), temperature source Oral, resp. rate 18, height 5\' 4"  (1.626 m), weight 77.8 kg, last menstrual period 08/05/2015, SpO2 90%.  Filed Weights   03/12/23 1250 03/13/23 0339 03/14/23 0543  Weight: 80.7 kg 78.6 kg 77.8 kg    Labs & Radiologic Studies    CBC Recent Labs    03/12/23 0826 03/12/23 1609 03/12/23 1611 03/13/23 0414  WBC 5.5  --   --  8.7  HGB 14.0   < > 16.7* 14.9  HCT 44.8   < > 49.0*  46.4*  MCV 70.9*  --   --  69.7*  PLT 310  --   --  288   < > = values in this interval not displayed.   Basic Metabolic Panel Recent Labs    38/75/64 0826 03/12/23 1609 03/12/23 1611 03/13/23 0414  NA 143   < > 141 137  K 4.1   < > 3.9 3.8  CL 109  --   --  105  CO2 24  --   --  22  GLUCOSE 191*  --   --  205*  BUN 12  --   --  12  CREATININE 1.17*  --   --  1.06*  CALCIUM 9.6  --   --  9.3   < > = values in this interval not displayed.   Liver Function Tests No results for input(s): "AST", "ALT", "ALKPHOS", "BILITOT", "PROT", "ALBUMIN" in the last 72 hours. No results for input(s): "LIPASE", "AMYLASE" in the last 72 hours. High Sensitivity Troponin:   Recent Labs  Lab 03/12/23 0826 03/12/23 1037  TROPONINIHS 10 159*    BNP Invalid input(s): "POCBNP" D-Dimer No results for input(s): "DDIMER" in the last 72 hours. Hemoglobin A1C Recent Labs    03/12/23 2050  HGBA1C 7.2*   Fasting Lipid Panel Recent Labs    03/13/23 0414  CHOL 208*  HDL 40*  LDLCALC 138*  TRIG 148  CHOLHDL 5.2   Thyroid Function Tests No results for input(s): "TSH", "T4TOTAL", "T3FREE", "THYROIDAB" in the last 72 hours.  Invalid input(s):  "FREET3" _____________  ECHOCARDIOGRAM LIMITED  Result Date: 03/13/2023    ECHOCARDIOGRAM LIMITED REPORT   Patient Name:   Sandra Owens Date of Exam: 03/13/2023 Medical Rec #:  [id number]                Height:       64.0 in Accession #:    [id number]               Weight:       173.3 lb Date of Birth:  [birth year]                BSA:          1.841 m Patient Age:    57 years                 BP:           127/73 mmHg Patient Gender: F                        HR:           89 bpm. Exam Location:  Inpatient Procedure: Limited Echo, Cardiac Doppler, Color Doppler and Intracardiac            Opacification Agent Indications:    R07.9* Chest pain, unspecified  History:        Patient has prior history of Echocardiogram examinations, most                 recent 01/16/2023. CHF and Cardiomyopathy, Previous Myocardial                 Infarction, Acute MI and CAD, Signs/Symptoms:Chest Pain,                 Shortness of Breath and Dyspnea; Risk Factors:Hypertension,                 Current Smoker, Diabetes and Dyslipidemia.  Sonographer:    Sheralyn Boatman RDCS Referring Phys: [id number] Perlie Gold IMPRESSIONS  1. Hypokinesis of the inferior and mid septal walls Endocardium is difficult to see well. Would consider limited echo with Definity to further define wall motion, LV systolic function.. Left ventricular ejection fraction, by estimation, is 50 to 55%. The left ventricle has low normal function.  2. Right ventricular systolic function is normal. The right ventricular size is normal.  3. Left atrial size was grossly normal.  4. The mitral valve is normal in structure. Trivial mitral valve regurgitation.  5. The aortic valve has an indeterminant number of cusps. Aortic valve sclerosis is present, with no evidence of aortic valve stenosis.  6. The inferior vena cava is normal in size with greater than 50% respiratory variability, suggesting right atrial pressure of 3 mmHg. FINDINGS  Left Ventricle: Hypokinesis of the  inferior and mid septal walls Endocardium is difficult to see well. Would consider limited echo with Definity to further define wall motion, LV systolic function. Left ventricular ejection fraction, by estimation, is 50 to 55%. The left ventricle has low normal function. Definity contrast agent was given IV to delineate the left ventricular endocardial borders. The left ventricular internal cavity size was normal in size. There is no left ventricular hypertrophy. Right Ventricle: The right ventricular size is normal. Right vetricular wall thickness was not assessed. Right ventricular systolic function is normal. Left Atrium: Left atrial size was grossly normal. Right Atrium: Right atrial size was normal in size. Pericardium: There is no evidence of pericardial effusion. Mitral Valve: The mitral valve is normal in structure. Trivial mitral valve regurgitation. Tricuspid Valve: The tricuspid valve is grossly normal. Aortic Valve: The aortic valve has an indeterminant number of cusps. Aortic valve sclerosis is present, with no evidence of aortic valve stenosis. Pulmonic Valve: The pulmonic valve was not well visualized. Aorta: The aortic root and ascending aorta are structurally normal, with no evidence of dilitation. Venous: The inferior vena cava is normal in size with greater than 50% respiratory variability, suggesting right atrial pressure of 3 mmHg. Additional Comments: Spectral Doppler performed. Color Doppler performed.  LEFT VENTRICLE PLAX 2D LVIDd:         4.65 cm LVIDs:         3.40 cm LV PW:         0.90 cm LV IVS:        0.85 cm  LV Volumes (MOD) LV vol d, MOD A2C: 78.4 ml LV vol d, MOD A4C: 85.1 ml LV vol s, MOD A2C: 38.7 ml LV vol s, MOD A4C: 50.7 ml LV SV MOD A2C:     39.8 ml LV SV MOD A4C:     85.1 ml LV SV MOD BP:      37.7 ml IVC IVC diam: 1.50 cm  AORTA Ao Asc diam: 3.00 cm MITRAL VALVE MV Area (PHT): 4.40 cm MV Decel Time: 173 msec MV E velocity: 82.80 cm/s MV A velocity: 121.00 cm/s MV E/A ratio:   0.68 Dietrich Pates MD Electronically signed by Dietrich Pates MD Signature Date/Time: 03/13/2023/4:10:43 PM    Final    CARDIAC CATHETERIZATION  Result Date: 03/12/2023   Dist RCA lesion is 100% stenosed.   Mid RCA lesion is 30% stenosed.   Prox Cx to Mid Cx lesion is 50% stenosed.   Mid LAD lesion is 10% stenosed.   1st Mrg lesion is 40% stenosed. Patent mid LAD stent without restenosis Moderate mid Circumflex stenosis-this does not appear to  be flow limiting Dominant RCA with mild disease in the mid vessel. Occlusion of the distal RCA just before the takeoff of the small caliber PDA. The PDA is seen to fill from left to right collaterals (likely culprit vessel) Normal right and left heart pressures Recommendations: Medical management of her CAD. Her infarct vessel is the distal RCA with total occlusion just before the takeoff of the small caliber PDA (1.75 mm vessel). The PDA is filling from left to right collaterals. I will load her with Plavix tonight and would recommend DAPT with ASA/Plavix for one year. No options for PCI given the small size of the PDA. Will continue NTG drip tonight and opioids as needed for pain control. Echo is pending.   DG Chest 2 View  Result Date: 03/12/2023 CLINICAL DATA:  Chest pain and nausea EXAM: CHEST - 2 VIEW COMPARISON:  Chest radiograph dated 10/12/2015 FINDINGS: Normal lung volumes. Lingular atelectasis. Patchy left lower lobe opacity. No pleural effusion or pneumothorax. The heart size and mediastinal contours are within normal limits. Coronary artery stent projects over the left heart. No acute osseous abnormality. IMPRESSION: Patchy left lower lobe opacity, which may represent atelectasis, aspiration, or pneumonia. Electronically Signed   By: Agustin Cree M.D.   On: 03/12/2023 09:22   Disposition   Pt is being discharged home today in good condition.  Follow-up Plans & Appointments     Discharge Instructions     AMB referral to Phase II Cardiac Rehabilitation    Complete by: As directed    Diagnosis: Coronary Stents   After initial evaluation and assessments completed: Virtual Based Care may be provided alone or in conjunction with Phase 2 Cardiac Rehab based on patient barriers.: Yes   Intensive Cardiac Rehabilitation (ICR) [facility] location only OR Traditional Cardiac Rehabilitation (TCR) *If criteria for ICR are not met will enroll in TCR [facility] only): Yes   Diet - low sodium heart healthy   Complete by: As directed    Increase activity slowly   Complete by: As directed    Increase activity slowly   Complete by: As directed         Discharge Medications   Allergies as of 03/14/2023   No Known Allergies      Medication List     STOP taking these medications    nicotine 7 mg/24hr patch Commonly known as: NICODERM CQ - dosed in mg/24 hr Replaced by: nicotine 14 mg/24hr patch   spironolactone 25 MG tablet Commonly known as: ALDACTONE       TAKE these medications    aspirin EC 81 MG tablet Take 81 mg by mouth daily in the afternoon.   atorvastatin 40 MG tablet Commonly known as: LIPITOR Take 80 mg by mouth daily in the afternoon.   carvedilol 25 MG tablet Commonly known as: COREG Take 1 tablet (25 mg total) by mouth 2 (two) times daily with a meal.   clopidogrel 75 MG tablet Commonly known as: PLAVIX Take 1 tablet (75 mg total) by mouth daily.   dapagliflozin propanediol 10 MG Tabs tablet Commonly known as: FARXIGA Take 10 mg by mouth daily in the afternoon.   Entresto 97-103 MG Generic drug: sacubitril-valsartan Take 1 tablet by mouth twice daily   isosorbide mononitrate 30 MG 24 hr tablet Commonly known as: IMDUR Take 0.5 tablets (15 mg total) by mouth daily.   nicotine 14 mg/24hr patch Commonly known as: NICODERM CQ - dosed in mg/24 hours Place 1 patch (14 mg total) onto  the skin daily. Replaces: nicotine 7 mg/24hr patch   nitroGLYCERIN 0.4 MG SL tablet Commonly known as: NITROSTAT Place 1 tablet (0.4 mg  total) under the tongue every 5 (five) minutes as needed for chest pain.   Rybelsus 3 MG Tabs Generic drug: Semaglutide Take 3 tablets by mouth daily in the afternoon.           Outstanding Labs/Studies   none  Duration of Discharge Encounter   Greater than 30 minutes including physician time.  Signed, Theodore Demark, PA-C 03/14/2023, 6:04 PM

## 2023-03-14 ENCOUNTER — Encounter: Payer: Self-pay | Admitting: Physician Assistant

## 2023-03-14 ENCOUNTER — Other Ambulatory Visit (HOSPITAL_COMMUNITY): Payer: Self-pay

## 2023-03-14 DIAGNOSIS — I200 Unstable angina: Secondary | ICD-10-CM | POA: Diagnosis not present

## 2023-03-14 MED ORDER — NICOTINE 14 MG/24HR TD PT24
14.0000 mg | MEDICATED_PATCH | Freq: Every day | TRANSDERMAL | 0 refills | Status: AC
  Filled 2023-03-14: qty 28, 28d supply, fill #0

## 2023-03-14 MED ORDER — ISOSORBIDE MONONITRATE ER 30 MG PO TB24
15.0000 mg | ORAL_TABLET | Freq: Every day | ORAL | 3 refills | Status: DC
  Filled 2023-03-14 – 2023-04-13 (×2): qty 15, 30d supply, fill #0
  Filled 2023-05-09: qty 15, 30d supply, fill #1
  Filled 2023-06-07: qty 15, 30d supply, fill #2
  Filled 2023-07-16: qty 15, 30d supply, fill #3
  Filled 2023-08-10: qty 15, 30d supply, fill #4
  Filled 2023-09-11: qty 15, 30d supply, fill #5

## 2023-03-14 MED ORDER — SPIRONOLACTONE 25 MG PO TABS
12.5000 mg | ORAL_TABLET | Freq: Every day | ORAL | 3 refills | Status: DC
  Filled 2023-03-14: qty 15, 30d supply, fill #0

## 2023-03-14 MED ORDER — ISOSORBIDE MONONITRATE ER 30 MG PO TB24: 15.0000 mg | ORAL_TABLET | Freq: Every day | ORAL | Status: DC

## 2023-03-14 MED ORDER — ISOSORBIDE MONONITRATE ER 30 MG PO TB24
30.0000 mg | ORAL_TABLET | Freq: Every day | ORAL | 3 refills | Status: DC
  Filled 2023-03-14: qty 30, 30d supply, fill #0

## 2023-03-14 MED ORDER — CLOPIDOGREL BISULFATE 75 MG PO TABS
75.0000 mg | ORAL_TABLET | Freq: Every day | ORAL | 3 refills | Status: DC
  Filled 2023-03-14 – 2023-04-13 (×2): qty 30, 30d supply, fill #0
  Filled 2023-05-09: qty 30, 30d supply, fill #1
  Filled 2023-06-07: qty 30, 30d supply, fill #2
  Filled 2023-07-16: qty 30, 30d supply, fill #3
  Filled 2023-08-10: qty 30, 30d supply, fill #4
  Filled 2023-09-11: qty 30, 30d supply, fill #5

## 2023-03-14 NOTE — Progress Notes (Signed)
   03/14/23 1132  Assess: MEWS Score  BP (!) 83/53  MAP (mmHg) (!) 62  ECG Heart Rate (!) 109  Assess: MEWS Score  MEWS Temp 0  MEWS Systolic 1  MEWS Pulse 1  MEWS RR 0  MEWS LOC 0  MEWS Score 2  MEWS Score Color Yellow  Assess: if the MEWS score is Yellow or Red  Were vital signs accurate and taken at a resting state? Yes  Does the patient meet 2 or more of the SIRS criteria? No  MEWS guidelines implemented  Yes, yellow  Treat  MEWS Interventions Considered administering scheduled or prn medications/treatments as ordered  Take Vital Signs  Increase Vital Sign Frequency  Yellow: Q2hr x1, continue Q4hrs until patient remains green for 12hrs  Escalate  MEWS: Escalate Yellow: Discuss with charge nurse and consider notifying provider and/or RRT  Notify: Charge Nurse/RN  Name of Charge Nurse/RN Notified Eduardo Osier  Provider Notification  Provider Name/Title Dr. Rennis Golden  Date Provider Notified 03/14/23  Time Provider Notified 1132  Method of Notification Face-to-face  Notification Reason Change in status  Provider response At bedside  Date of Provider Response 03/14/23  Time of Provider Response 1132  Assess: SIRS CRITERIA  SIRS Temperature  0  SIRS Pulse 1  SIRS Respirations  0  SIRS WBC 0  SIRS Score Sum  1

## 2023-03-14 NOTE — Progress Notes (Signed)
Patient Name: Sandra Owens Date of Encounter: 03/14/2023 [facility] Cardiologist: [name], MD   Interval Summary  .    No further chest pain overnight. Limited echo yesterday showed LVEF 50-55% with inferior and mid-inferoseptal hypokinesis. Normal RV systolic function. Today she feels lightheaded - BP this morning was 112 systolic, however, after sitting and standing her SBP was 83 - orthostatic positive. She feels it was due to getting all her medicines at once.  Vital Signs .    Vitals:   03/13/23 2031 03/14/23 0543 03/14/23 0600 03/14/23 1046  BP: 115/65 113/69  112/71  Pulse: 85 94 87 (!) 102  Resp: 16 18    Temp: 98.5 F (36.9 C) 99.7 F (37.6 C)    TempSrc: Oral Oral    SpO2: 91% 92% 93% 90%  Weight:  77.8 kg    Height:        Intake/Output Summary (Last 24 hours) at 03/14/2023 1122 Last data filed at 03/13/2023 2000 Gross per 24 hour  Intake 152.06 ml  Output --  Net 152.06 ml      03/14/2023    5:43 AM 03/13/2023    3:39 AM 03/12/2023   12:50 PM  Last 3 Weights  Weight (lbs) 171 lb 9.6 oz 173 lb 4.8 oz 178 lb  Weight (kg) 77.837 kg 78.608 kg 80.74 kg      Telemetry/ECG    Sinus rhythm with isolated PVCs - Personally Reviewed  Physical Exam .    General appearance: alert and no distress Lungs: clear to auscultation bilaterally Heart: regular rate and rhythm, S1, S2 normal, no murmur, click, rub or gallop Extremities: extremities normal, atraumatic, no cyanosis or edema Neurologic: Grossly normal   Assessment & Plan .     Chest pain CAD  Patient with prior PCI to mLAD in 2017 presented to the ED yesterday with chest pain first intermittent 3 weeks ago but progressing to continuous. Hs troponin 10 --> 159 and with history of CAD, she was started on heparin/nitroglycerin with plans for LHC. This found distal RCA with total occlusion just before the takeoff of the small caliber PDA (1.75 mm vessel). The PDA noted to be  filling from left to right collaterals. No options for PCI given the small size of the PDA.  Continue IV nitroglycerin with ongoing chest pain. ECG today with inferior lead TWI consistent with evolving distal RCA infarction. Low normal LVEF 50-55% on echo ASA 81mg  Plavix 75mg  daily Coreg 25mg  BID  Hyperlipidemia  Continue Atorvastatin 80mg . Patient states she had just resumed taking this about a week prior to admission.   Lab Results  Component Value Date   CHOL 208 (H) 03/13/2023   HDL 40 (L) 03/13/2023   LDLCALC 138 (H) 03/13/2023   TRIG 148 03/13/2023   CHOLHDL 5.2 03/13/2023   Chronic systolic and diastolic heart failure LVEF as low as 15-20% in 2017 but improved after PCI and with GDMT to normal in 2019. Most recent echo in September 2024 with LVEF 45-50%.   Continue coreg 25 mg BID, 10 mg farxiga, 97-103 mg entresto BID, 12.5 mg spironolactone Limited echo shows LVEF 50-55%  PAD Right SFA occluded - found on sheath introduction during heart cath 2017.   She is orthostatic today. Will monitor BP and encourage hydration today. Hold aldactone - will decrease imdur to 15 mg daily - unfortunately, she received all of her meds already this am. Possible d/c this afternoon or tomorrow.   For questions  or updates, please contact [facility] Please consult [email or web address] for contact info under   Chrystie Nose, MD, Milagros Loll  [place]  [facility]  Medical Director of the [facility] Lipid Disorders &  Cardiovascular Risk Reduction Clinic Diplomate of the American Board of Clinical Lipidology Attending Cardiologist  Direct Dial: [phone]  Fax: [phone]  Website:  [email or web address]  Chrystie Nose, MD

## 2023-03-15 LAB — LIPOPROTEIN A (LPA): Lipoprotein (a): 155.5 nmol/L — ABNORMAL HIGH (ref <75.0)

## 2023-03-16 ENCOUNTER — Telehealth (HOSPITAL_BASED_OUTPATIENT_CLINIC_OR_DEPARTMENT_OTHER): Payer: Self-pay | Admitting: Cardiovascular Disease

## 2023-03-16 NOTE — Telephone Encounter (Signed)
Patient's husband stopped by to drop off insurance paperwork for Dr. Duke Salvia to complete. Patient was recently admitted and husband wants to file with his [facility] supplemental insurance. Per patient's husband, the hospital providers would not fill the paperwork out and he is hoping that Dr. Duke Salvia can help. Informed patient's husband that there might be a fee and that we would call him. Paperwork is in Dr. Leonides Sake box.

## 2023-03-17 NOTE — Telephone Encounter (Signed)
Patient called to follow-up on status of [facility] paperwork and when he can come to pick it up.

## 2023-03-17 NOTE — Telephone Encounter (Signed)
Dr Duke Salvia not in the office, left message will call when form completed

## 2023-03-20 ENCOUNTER — Encounter (HOSPITAL_COMMUNITY): Payer: 59

## 2023-03-20 ENCOUNTER — Ambulatory Visit (HOSPITAL_BASED_OUTPATIENT_CLINIC_OR_DEPARTMENT_OTHER): Payer: 59 | Admitting: Family

## 2023-03-20 ENCOUNTER — Encounter (HOSPITAL_BASED_OUTPATIENT_CLINIC_OR_DEPARTMENT_OTHER): Payer: Self-pay

## 2023-03-20 ENCOUNTER — Telehealth (HOSPITAL_BASED_OUTPATIENT_CLINIC_OR_DEPARTMENT_OTHER): Payer: Self-pay

## 2023-03-20 ENCOUNTER — Encounter (HOSPITAL_BASED_OUTPATIENT_CLINIC_OR_DEPARTMENT_OTHER): Payer: Self-pay | Admitting: Family

## 2023-03-20 ENCOUNTER — Other Ambulatory Visit (HOSPITAL_COMMUNITY): Payer: Self-pay

## 2023-03-20 VITALS — BP 112/68 | HR 105 | Ht 64.0 in | Wt 177.3 lb

## 2023-03-20 DIAGNOSIS — I10 Essential (primary) hypertension: Secondary | ICD-10-CM

## 2023-03-20 DIAGNOSIS — Z720 Tobacco use: Secondary | ICD-10-CM

## 2023-03-20 DIAGNOSIS — I25118 Atherosclerotic heart disease of native coronary artery with other forms of angina pectoris: Secondary | ICD-10-CM

## 2023-03-20 DIAGNOSIS — I5022 Chronic systolic (congestive) heart failure: Secondary | ICD-10-CM

## 2023-03-20 DIAGNOSIS — I420 Dilated cardiomyopathy: Secondary | ICD-10-CM | POA: Diagnosis not present

## 2023-03-20 DIAGNOSIS — E785 Hyperlipidemia, unspecified: Secondary | ICD-10-CM | POA: Diagnosis not present

## 2023-03-20 DIAGNOSIS — E7849 Other hyperlipidemia: Secondary | ICD-10-CM

## 2023-03-20 MED ORDER — REPATHA SURECLICK 140 MG/ML ~~LOC~~ SOAJ: 140.0000 mg | SUBCUTANEOUS | 0 refills | Status: DC

## 2023-03-20 MED ORDER — REPATHA SURECLICK 140 MG/ML ~~LOC~~ SOAJ: 140.0000 mg | SUBCUTANEOUS | 3 refills | Status: AC

## 2023-03-20 MED ORDER — ROSUVASTATIN CALCIUM 20 MG PO TABS: 20.0000 mg | ORAL_TABLET | Freq: Every day | ORAL | 1 refills | Status: DC

## 2023-03-20 NOTE — Progress Notes (Signed)
Cardiology Office Note:  .   Date:  03/20/2023  ID:  Sandra Owens, DOB 08/14/1965, MRN [id number] PCP: Merri Brunette, MD  [facility] Providers Cardiologist:  [name], MD Cardiology APP:  Alver Sorrow, NP    History of Present Illness: .   Sandra Owens is a 57 y.o. female with hx of CAD (PCI-mLAD 2017, NSTEMI 03/2023), HLD, chronic systolic and diastolic heart failure with recovered LVEF,  HTN, PAD (occluded R SFA), tobacco use.  Prior PCI-mLAD 2017.   Admitted 11/7-11/9/24 with NSTEMI. Chest pain started 3 weeks prior but was progressing. HS toponin 10 ? 159. LHC 03/12/23 patent mid LAD stenot without restenosis, moderate mid Cx stenosis does not appear to be flow limiting. Recommended for medical management. Infarct vessel distal RCA with total occusion just before PDA takeoff with collaterals. No option for PCI given small size of PDA. Recommended DAPT ASA/Plavix x 1 year. Echo normal LVEF 50-55%. Imdur added prior to discharge. LDL 138 and Lp(a) 155.5  Presents today for follow up with her husband. Most bothered by left shoulder and neck pain. Had cortisone injection earlier this week with orthopedics Dr. Freida Busman in her left shoulder with persistent pain which has been attributed to arthritis.Reports no shortness of breath nor dyspnea on exertion. Reports no chest pain, pressure, or tightness. No edema, orthopnea, PND. Reports no palpitations.  Reviewed LHC in detail. Difficulty with Atorvastatin pill size but also myalgias. Was not taking consistently prior to admission.   ROS: Please see the history of present illness.    All other systems reviewed and are negative.   Studies Reviewed: .        Cardiac Studies & Procedures   CARDIAC CATHETERIZATION  CARDIAC CATHETERIZATION 03/12/2023  Narrative   Dist RCA lesion is 100% stenosed.   Mid RCA lesion is 30% stenosed.   Prox Cx to Mid Cx lesion is 50% stenosed.   Mid LAD lesion is 10%  stenosed.   1st Mrg lesion is 40% stenosed.  Patent mid LAD stent without restenosis Moderate mid Circumflex stenosis-this does not appear to be flow limiting Dominant RCA with mild disease in the mid vessel. Occlusion of the distal RCA just before the takeoff of the small caliber PDA. The PDA is seen to fill from left to right collaterals (likely culprit vessel) Normal right and left heart pressures  Recommendations: Medical management of her CAD. Her infarct vessel is the distal RCA with total occlusion just before the takeoff of the small caliber PDA (1.75 mm vessel). The PDA is filling from left to right collaterals. I will load her with Plavix tonight and would recommend DAPT with ASA/Plavix for one year. No options for PCI given the small size of the PDA. Will continue NTG drip tonight and opioids as needed for pain control. Echo is pending.  Findings Coronary Findings Diagnostic  Dominance: Right  Left Anterior Descending Mid LAD lesion is 10% stenosed. The lesion was previously treated using a drug eluting stent over 2 years ago.  Left Circumflex Vessel is moderate in size. Prox Cx to Mid Cx lesion is 50% stenosed.  First Obtuse Marginal Branch 1st Mrg lesion is 40% stenosed.  Right Coronary Artery Mid RCA lesion is 30% stenosed. Dist RCA lesion is 100% stenosed. The lesion is chronically occluded.  Right Posterior Descending Artery Collaterals RPDA filled by collaterals from 2nd Sept.  Intervention  No interventions have been documented.   CARDIAC CATHETERIZATION  CARDIAC CATHETERIZATION 10/17/2015  Narrative 1.  Ost 3rd Diag to 3rd Diag lesion, 75% stenosed. 2. Dist RCA lesion, 40% stenosed. 3. Mid Cx lesion, 70% stenosed. 4. Mid LAD lesion, 90% stenosed. Post intervention, there is a 0% residual stenosis.   Successful stent of the mid LAD from 95% to 0% using 32 x 3,0 Promus Premier post dilated to 3.25 mm in diameter.  Procedure was complicated by transient  occlusion of 2 small septal perforators that led to transient cardiogenic shock requiring intravenous dopamine and IV fluid administration. Intra-aortic balloon pump was contemplated but a sheath injection demonstrated total occlusion of the right superficial femoral artery near the sheath entry site. Because of concern about potential limb ischemia, IABP was aborted. Approximately 15-20 minutes after the acute decompensation the patient was back to near normal. Dopamine was discontinued. Chest discomfort resolved.  Findings Coronary Findings Diagnostic  Dominance: Right  Left Anterior Descending  Second Diagonal Branch The vessel is small in size.  Third Diagonal Branch The vessel is small in size.  Left Circumflex . Vessel is small.  Right Coronary Artery There is mild the vessel.  Right Posterior Atrioventricular Artery The vessel is small in size.  Intervention  Mid LAD lesion PCI The pre-interventional distal flow is normal (TIMI 3). Pre-stent angioplasty was performed. A drug-eluting stent was placed. The strut is apposed. Post-stent angioplasty was performed. The post-interventional distal flow is normal (TIMI 3). The intervention was successful. Septal perforator jailed with subsequent hypotension requiring IV dopamine. There is a 0% residual stenosis post intervention.   STRESS TESTS  NM MYOCAR MULTI W/SPECT W 11/17/2014  Narrative  Defect 1: There is a large defect of moderate severity present in the basal anterior, basal anteroseptal, mid anterior and apical anterior location.  Findings consistent with prior myocardial infarction with peri-infarct ischemia. The aforementioned regions are partially reversible defects.  This is a high risk study.  The left ventricular ejection fraction is severely decreased (<30%).  Nuclear stress EF: 20%.   ECHOCARDIOGRAM  ECHOCARDIOGRAM LIMITED 03/13/2023  Narrative ECHOCARDIOGRAM LIMITED REPORT    Patient Name:   Sandra Owens Date of Exam: 03/13/2023 Medical Rec #:  [id number]                Height:       64.0 in Accession #:    [id number]               Weight:       173.3 lb Date of Birth:  03-21-1966                BSA:          1.841 m Patient Age:    57 years                 BP:           127/73 mmHg Patient Gender: F                        HR:           89 bpm. Exam Location:  Inpatient  Procedure: Limited Echo, Cardiac Doppler, Color Doppler and Intracardiac Opacification Agent  Indications:    R07.9* Chest pain, unspecified  History:        Patient has prior history of Echocardiogram examinations, most recent 01/16/2023. CHF and Cardiomyopathy, Previous Myocardial Infarction, Acute MI and CAD, Signs/Symptoms:Chest Pain, Shortness of Breath and Dyspnea; Risk Factors:Hypertension, Current Smoker, Diabetes and Dyslipidemia.  Sonographer:  Sheralyn Boatman RDCS Referring Phys: [id number] Perlie Gold  IMPRESSIONS   1. Hypokinesis of the inferior and mid septal walls Endocardium is difficult to see well. Would consider limited echo with Definity to further define wall motion, LV systolic function.. Left ventricular ejection fraction, by estimation, is 50 to 55%. The left ventricle has low normal function. 2. Right ventricular systolic function is normal. The right ventricular size is normal. 3. Left atrial size was grossly normal. 4. The mitral valve is normal in structure. Trivial mitral valve regurgitation. 5. The aortic valve has an indeterminant number of cusps. Aortic valve sclerosis is present, with no evidence of aortic valve stenosis. 6. The inferior vena cava is normal in size with greater than 50% respiratory variability, suggesting right atrial pressure of 3 mmHg.  FINDINGS Left Ventricle: Hypokinesis of the inferior and mid septal walls Endocardium is difficult to see well. Would consider limited echo with Definity to further define wall motion, LV systolic function. Left  ventricular ejection fraction, by estimation, is 50 to 55%. The left ventricle has low normal function. Definity contrast agent was given IV to delineate the left ventricular endocardial borders. The left ventricular internal cavity size was normal in size. There is no left ventricular hypertrophy.  Right Ventricle: The right ventricular size is normal. Right vetricular wall thickness was not assessed. Right ventricular systolic function is normal.  Left Atrium: Left atrial size was grossly normal.  Right Atrium: Right atrial size was normal in size.  Pericardium: There is no evidence of pericardial effusion.  Mitral Valve: The mitral valve is normal in structure. Trivial mitral valve regurgitation.  Tricuspid Valve: The tricuspid valve is grossly normal.  Aortic Valve: The aortic valve has an indeterminant number of cusps. Aortic valve sclerosis is present, with no evidence of aortic valve stenosis.  Pulmonic Valve: The pulmonic valve was not well visualized.  Aorta: The aortic root and ascending aorta are structurally normal, with no evidence of dilitation.  Venous: The inferior vena cava is normal in size with greater than 50% respiratory variability, suggesting right atrial pressure of 3 mmHg.  Additional Comments: Spectral Doppler performed. Color Doppler performed.  LEFT VENTRICLE PLAX 2D LVIDd:         4.65 cm LVIDs:         3.40 cm LV PW:         0.90 cm LV IVS:        0.85 cm  LV Volumes (MOD) LV vol d, MOD A2C: 78.4 ml LV vol d, MOD A4C: 85.1 ml LV vol s, MOD A2C: 38.7 ml LV vol s, MOD A4C: 50.7 ml LV SV MOD A2C:     39.8 ml LV SV MOD A4C:     85.1 ml LV SV MOD BP:      37.7 ml  IVC IVC diam: 1.50 cm   AORTA Ao Asc diam: 3.00 cm  MITRAL VALVE MV Area (PHT): 4.40 cm MV Decel Time: 173 msec MV E velocity: 82.80 cm/s MV A velocity: 121.00 cm/s MV E/A ratio:  0.68  Dietrich Pates MD Electronically signed by Dietrich Pates MD Signature Date/Time:  03/13/2023/4:10:43 PM    Final             Risk Assessment/Calculations:             Physical Exam:   VS:  BP 112/68 (BP Location: Left Arm, Patient Position: Sitting, Cuff Size: Normal)   Pulse (!) 105   Ht 5\' 4"  (1.626 m)  Wt 177 lb 4.8 oz (80.4 kg)   LMP 08/05/2015 (Approximate)   SpO2 96%   BMI 30.43 kg/m    Wt Readings from Last 3 Encounters:  03/20/23 177 lb 4.8 oz (80.4 kg)  03/14/23 171 lb 9.6 oz (77.8 kg)  01/02/23 174 lb (78.9 kg)    GEN: Well nourished, well developed in no acute distress NECK: No JVD; No carotid bruits CARDIAC: RRR, no murmurs, rubs, gallops RESPIRATORY:  Clear to auscultation without rales, wheezing or rhonchi  ABDOMEN: Soft, non-tender, non-distended EXTREMITIES:  No edema; No deformity   ASSESSMENT AND PLAN: .    CAD / HLD, LDL goal <55 / Familial hyperlipidemia - Prior DES 2017 with more recent NSTEMI 03/2023 with distal RCA not amenable to PCI with collaterals. R radial cath site appropriately healed. Plan for medical management. GDMT Aspirin, Coreg, PLavix, Farxiga. DAPT for 1 year from NSTEMI. Does not tolerate atorvastatin with myalgias, discontinue. Start Rosuvastatin 20mg  daily and Repatha q14 days. Sample of Repatha provided in clinic. FLP in 3 mos.   Tobacco use - Smoking cessation encouraged. Recommend utilization of 1800QUITNOW.   Chronic systolic and diastolic heart failure with recovered LVEF - Normal LVEF by echo during recent admission. Euvolemic and well compensated on exam. GDMT Imdur, Sherryll Burger, Farxiga. No need for loop diuretic.   HTN - BP controlled in clinic, relatively hypotensive. Taking Coreg daily, discussed need for BID dosing. Discussed reducing Entresto vs Coreg dose for hypotension given addition of Imdur. She will monitor at home as return to works Monday. If persistent hypotension, plan to adjust regimen.  DM2 - Continue to follow with PCP.        Dispo: follow up in 2-3 mos  Signed, Alver Sorrow, NP

## 2023-03-20 NOTE — Patient Instructions (Signed)
Medication Instructions:  Your physician has recommended you make the following change in your medication:  STOP Atovastatin START Crestor 20mg  daily We are giving you Repatha sample. (We will wait for prior authorization. As soon as we find out results, we will contact you!)  *If you need a refill on your cardiac medications before your next appointment, please call your pharmacy*  Lab Work: CMP and Lipid Panel: 3 months after starting Repatha  If you have labs (blood work) drawn today and your tests are completely normal, you will receive your results only by: MyChart Message (if you have MyChart) OR A paper copy in the mail If you have any lab test that is abnormal or we need to change your treatment, we will call you to review the results.  Follow-Up: At [facility], you and your health needs are our priority.  As part of our continuing mission to provide you with exceptional heart care, we have created designated Provider Care Teams.  These Care Teams include your primary Cardiologist (physician) and Advanced Practice Providers (APPs -  Physician Assistants and Nurse Practitioners) who all work together to provide you with the care you need, when you need it.  We recommend signing up for the patient portal called "MyChart".  Sign up information is provided on this After Visit Summary.  MyChart is used to connect with patients for Virtual Visits (Telemedicine).  Patients are able to view lab/test results, encounter notes, upcoming appointments, etc.  Non-urgent messages can be sent to your provider as well.   To learn more about what you can do with MyChart, go to [email or web address].    Your next appointment:   2-3 month(s)  Provider:   [name], MD or Gillian Shields, NP

## 2023-03-20 NOTE — Telephone Encounter (Signed)
Medication sent to prior auth team

## 2023-03-24 ENCOUNTER — Telehealth: Payer: Self-pay

## 2023-03-24 NOTE — Telephone Encounter (Addendum)
**Note De-Identified Sandra Owens Obfuscation** Ordering provider: Dr Duke Salvia Associated diagnoses: Fatigue-R53.83 WatchPAT PA obtained on 03/24/2023 by Jaquila Santelli, Lorelle Formosa, LPN. Authorization: Per the [facility] Provider Portal: Procedure code: 40102 Description Sleep study, unattended, simultaneous recording; heart rate, oxygen saturation, respiratory analysis (eg, by airflow or peripheral arterial tone), and sleep time Inquiry summary Notification/Prior Authorization not required for this service. Patient notified of PIN (1234) on 03/24/2023 [name] Notification Method: MyChart message. I called the pt but got no answer so I left a message on her VM advising her that I have sent her a [facility] message and that if she has any questions or concerns to contact us.  Phone note routed to covering staff for follow-up.

## 2023-03-25 NOTE — Telephone Encounter (Signed)
Completed by Ronn Melena NP at patients visit

## 2023-04-13 ENCOUNTER — Other Ambulatory Visit (HOSPITAL_COMMUNITY): Payer: Self-pay

## 2023-04-13 ENCOUNTER — Other Ambulatory Visit (HOSPITAL_BASED_OUTPATIENT_CLINIC_OR_DEPARTMENT_OTHER): Payer: Self-pay

## 2023-04-13 DIAGNOSIS — I25118 Atherosclerotic heart disease of native coronary artery with other forms of angina pectoris: Secondary | ICD-10-CM

## 2023-04-14 ENCOUNTER — Other Ambulatory Visit: Payer: Self-pay | Admitting: Family Medicine

## 2023-04-14 DIAGNOSIS — Z1231 Encounter for screening mammogram for malignant neoplasm of breast: Secondary | ICD-10-CM

## 2023-04-16 NOTE — Therapy (Signed)
OUTPATIENT PHYSICAL THERAPY SHOULDER EVALUATION   Patient Name: Sandra Owens MRN: [id number] DOB:10/27/1965, 57 y.o., female Today's Date: 04/17/2023  END OF SESSION:  PT End of Session - 04/17/23 1435     Visit Number 1    Date for PT Re-Evaluation 06/26/23    PT Start Time 1100    PT Stop Time 1140    PT Time Calculation (min) 40 min    Activity Tolerance Patient limited by pain    Behavior During Therapy [facility] for tasks assessed/performed             Past Medical History:  Diagnosis Date   Coronary artery disease    Essential hypertension    GERD (gastroesophageal reflux disease)    Hyperlipidemia    Low back pain    Systolic CHF (HCC)    a. Found to have new low EF 11/2014 with abnormal nuc.   Type 2 diabetes mellitus (HCC)    Past Surgical History:  Procedure Laterality Date   CARDIAC CATHETERIZATION N/A 11/23/2014   Procedure: Right/Left Heart Cath and Coronary Angiography;  Surgeon: Corky Crafts, MD;  Location: [facility] INVASIVE CV LAB;  Service: Cardiovascular;  Laterality: N/A;   CARDIAC CATHETERIZATION N/A 10/15/2015   Procedure: Right/Left Heart Cath and Coronary Angiography;  Surgeon: Lyn Records, MD;  Location: [facility] INVASIVE CV LAB;  Service: Cardiovascular;  Laterality: N/A;   CARDIAC CATHETERIZATION N/A 10/17/2015   Procedure: Coronary Stent Intervention;  Surgeon: Lyn Records, MD;  Location: [facility] INVASIVE CV LAB;  Service: Cardiovascular;  Laterality: N/A;   CORONARY ANGIOPLASTY     RIGHT/LEFT HEART CATH AND CORONARY ANGIOGRAPHY N/A 03/12/2023   Procedure: RIGHT/LEFT HEART CATH AND CORONARY ANGIOGRAPHY;  Surgeon: Kathleene Hazel, MD;  Location: [facility] INVASIVE CV LAB;  Service: Cardiovascular;  Laterality: N/A;   TUBAL LIGATION     Patient Active Problem List   Diagnosis Date Noted   Unstable angina (HCC) 03/12/2023   Tobacco abuse 03/12/2023   Primary hypercholesterolemia 03/12/2023   Coronary artery disease involving native coronary  artery of native heart with unstable angina pectoris (HCC)    CAD (coronary artery disease) 10/14/2015   Dilated cardiomyopathy (HCC) 10/14/2015   Chronic systolic congestive heart failure (HCC)    Esophageal reflux    Chest pain 10/12/2015   NSTEMI (non-ST elevated myocardial infarction) (HCC) 10/12/2015   Primary hypertension    Type 2 diabetes mellitus (HCC)    Low back pain    GERD (gastroesophageal reflux disease)    Systolic CHF (HCC)    Abnormal nuclear stress test    Shortness of breath     PCP: Merri Brunette, MD  REFERRING PROVIDER: Hurman Horn, MD   REFERRING DIAG: [phone] (ICD-10-CM) - Pain in left shoulder   THERAPY DIAG:  Abnormal posture  Muscle weakness (generalized)  Other lack of coordination  Chronic left shoulder pain  Stiffness of left shoulder, not elsewhere classified  Rationale for Evaluation and Treatment: Rehabilitation  ONSET DATE: 03/23/23  SUBJECTIVE:  SUBJECTIVE STATEMENT: Patient reports that she started having shoulder pain. She was diagnosed with bursitis. She received a cortizone injection which did help some. Hand dominance: Right  PERTINENT HISTORY: Per referring physician note: PMHx: DM2, HTN, [facility], reflux, LBP, CHF, EF 25-30% L shoulder subacromial bursitis, C spine OA changes as well, s/p CSI 03/18/23 Extensive cardiac history with recent hospitalization. CAD, chronic SCHF Increasing shoulder pain since Sept extends into shoulder into bicep and forearm, crunching in neck, occ N&T in hand. Injection 03/18/23 to L shoulder  PAIN:  Are you having pain? Yes: NPRS scale: 6/10 Pain location: L shoulder Pain description: burning Aggravating factors: combing hair, reaching for seat belt, Lying on her L shoulder any reaching up Relieving factors:  Injection helped some.  PRECAUTIONS: Fall  RED FLAGS: None   WEIGHT BEARING RESTRICTIONS: No  FALLS:  Has patient fallen in last 6 months? No  LIVING ENVIRONMENT: Lives with: lives with their family Lives in: House/apartment Stairs: no issues Has following equipment at home: None  OCCUPATION: Works as a Merchandiser, retail, some lifting,   PLOF: Independent  PATIENT GOALS:Patient would like to get relief from her pain.  NEXT MD VISIT:   OBJECTIVE:  Note: Objective measures were completed at Evaluation unless otherwise noted.  DIAGNOSTIC FINDINGS:  She reports that she did have an X ray.Unable to see results  PATIENT SURVEYS:  Quick Dash 70.3  COGNITION: Overall cognitive status: Within functional limits for tasks assessed     SENSATION: WFL  POSTURE: Flattened Thoracic spine  UPPER EXTREMITY ROM: R WNL  Active ROM Right eval Left eval  Shoulder flexion  106  Shoulder extension    Shoulder abduction  74  Shoulder adduction    Shoulder internal rotation  WNL, P  Shoulder external rotation  35  Elbow flexion    Elbow extension    Wrist flexion    Wrist extension    Wrist ulnar deviation    Wrist radial deviation    Wrist pronation    Wrist supination    (Blank rows = not tested)  UPPER EXTREMITY MMT: L shoulder MMT deferred due to pain with activation, otherwise WNL  SHOULDER SPECIAL TESTS: Impingement tests: Hawkins/Kennedy impingement test: positive  and Painful arc test: positive  SLAP lesions:  Instability tests:  Rotator cuff assessment: Empty can test: positive  Biceps assessment: Speed's test: negative  JOINT MOBILITY TESTING:  L GH joint with decreased inf and post glide  PALPATION:  Tight and TTP on R cerv paraspinals, UT, LS. TTP on post/sup aspect of L shoulder   TODAY'S TREATMENT:                                                                                                                                         DATE:   04/17/23 Education Seated press ups, seated inferior glide.  PATIENT EDUCATION: Education details: POC, HEP Person educated: Patient Education  method: Explanation and Demonstration Education comprehension: verbalized understanding and returned demonstration  HOME EXERCISE PROGRAM: [id number]  ASSESSMENT:  CLINICAL IMPRESSION: Patient is a 57 y.o. who was seen today for physical therapy evaluation and treatment for L shoulder bursitis. She reports onset of L shoulder pain. She saw the Dr who diagnosed bursitis with some impingement and provided an injection. Patient reports improved pain today, although still present. Upon evaluation she does show pain with L shoulder elevation, below 90 degrees, limited strength testing due to pain, decreased R GH joint mobility, positive impingement testing, and TTP along L upper shoulder. She will benefit from PT to address her acute pain as well as facilitate improved muscle length and ROM and increase strength and stability in her trunk to improve her postural control and normalize joint mechanics to decrease risk of further impingement.  OBJECTIVE IMPAIRMENTS: decreased activity tolerance, decreased coordination, decreased ROM, decreased strength, increased muscle spasms, impaired flexibility, impaired UE functional use, improper body mechanics, and pain.   ACTIVITY LIMITATIONS: carrying, lifting, sleeping, bathing, dressing, reach over head, and hygiene/grooming  PARTICIPATION LIMITATIONS: meal prep, cleaning, laundry, shopping, community activity, and occupation  PERSONAL FACTORS: Past/current experiences are also affecting patient's functional outcome.   REHAB POTENTIAL: Good  CLINICAL DECISION MAKING: Stable/uncomplicated  EVALUATION COMPLEXITY: Low   GOALS: Goals reviewed with patient? Yes  SHORT TERM GOALS: Target date: 05/05/23  I with initial HEP Baseline: Goal status: INITIAL  LONG TERM GOALS: Target date: 06/26/23  I with  final HEP Baseline:  Goal status: INITIAL  2.  Improve her Quick Dash score to <21% to achieve mild impairment. Baseline:  Goal status: INITIAL  3.  Patient will achieve full L shoulder AROM with pain < 3/10 Baseline:  Goal status: INITIAL  4.  Patient will achieve L shoulder strength of at least 4/5 Baseline:  Goal status: INITIAL  5.  Patient will report the ability to perform all of her normal daily activities with L shoulder pain < 3/10 Baseline:  Goal status: INITIAL  PLAN:  PT FREQUENCY: 2x/week  PT DURATION: 10 weeks  PLANNED INTERVENTIONS: 97110-Therapeutic exercises, 97530- Therapeutic activity, 97112- Neuromuscular re-education, 97535- Self Care, 91478- Manual therapy, 97014- Electrical stimulation (unattended), 97016- Vasopneumatic device, 97035- Ultrasound, 29562- Ionotophoresis 4mg /ml Dexamethasone, Patient/Family education, Taping, Dry Needling, Joint mobilization, Cryotherapy, and Moist heat  PLAN FOR NEXT SESSION: Acute pain management, STM and stretch, strengthening, update HEP   Iona Beard, DPT 04/17/2023, 2:43 PM

## 2023-04-17 ENCOUNTER — Ambulatory Visit: Payer: 59 | Attending: Sports Medicine | Admitting: Physical Therapy

## 2023-04-17 ENCOUNTER — Encounter: Payer: Self-pay | Admitting: Physical Therapy

## 2023-04-17 DIAGNOSIS — M25512 Pain in left shoulder: Secondary | ICD-10-CM | POA: Insufficient documentation

## 2023-04-17 DIAGNOSIS — M6281 Muscle weakness (generalized): Secondary | ICD-10-CM | POA: Insufficient documentation

## 2023-04-17 DIAGNOSIS — G8929 Other chronic pain: Secondary | ICD-10-CM | POA: Insufficient documentation

## 2023-04-17 DIAGNOSIS — R293 Abnormal posture: Secondary | ICD-10-CM | POA: Diagnosis present

## 2023-04-17 DIAGNOSIS — M25612 Stiffness of left shoulder, not elsewhere classified: Secondary | ICD-10-CM | POA: Diagnosis present

## 2023-04-17 DIAGNOSIS — R278 Other lack of coordination: Secondary | ICD-10-CM | POA: Diagnosis present

## 2023-04-22 ENCOUNTER — Ambulatory Visit: Payer: 59 | Admitting: Physical Therapy

## 2023-04-22 ENCOUNTER — Encounter: Payer: Self-pay | Admitting: Physical Therapy

## 2023-04-22 DIAGNOSIS — M6281 Muscle weakness (generalized): Secondary | ICD-10-CM

## 2023-04-22 DIAGNOSIS — R278 Other lack of coordination: Secondary | ICD-10-CM

## 2023-04-22 DIAGNOSIS — G8929 Other chronic pain: Secondary | ICD-10-CM

## 2023-04-22 DIAGNOSIS — R293 Abnormal posture: Secondary | ICD-10-CM | POA: Diagnosis not present

## 2023-04-22 DIAGNOSIS — M25612 Stiffness of left shoulder, not elsewhere classified: Secondary | ICD-10-CM

## 2023-04-22 NOTE — Therapy (Signed)
OUTPATIENT PHYSICAL THERAPY SHOULDER EVALUATION   Patient Name: Sandra Owens MRN: [id number] DOB:1966-01-11, 57 y.o., female Today's Date: 04/22/2023  END OF SESSION:  Past Medical History:  Diagnosis Date   Coronary artery disease    Essential hypertension    GERD (gastroesophageal reflux disease)    Hyperlipidemia    Low back pain    Systolic CHF (HCC)    a. Found to have new low EF 11/2014 with abnormal nuc.   Type 2 diabetes mellitus (HCC)    Past Surgical History:  Procedure Laterality Date   CARDIAC CATHETERIZATION N/A 11/23/2014   Procedure: Right/Left Heart Cath and Coronary Angiography;  Surgeon: Corky Crafts, MD;  Location: [facility] INVASIVE CV LAB;  Service: Cardiovascular;  Laterality: N/A;   CARDIAC CATHETERIZATION N/A 10/15/2015   Procedure: Right/Left Heart Cath and Coronary Angiography;  Surgeon: Lyn Records, MD;  Location: [facility] INVASIVE CV LAB;  Service: Cardiovascular;  Laterality: N/A;   CARDIAC CATHETERIZATION N/A 10/17/2015   Procedure: Coronary Stent Intervention;  Surgeon: Lyn Records, MD;  Location: [facility] INVASIVE CV LAB;  Service: Cardiovascular;  Laterality: N/A;   CORONARY ANGIOPLASTY     RIGHT/LEFT HEART CATH AND CORONARY ANGIOGRAPHY N/A 03/12/2023   Procedure: RIGHT/LEFT HEART CATH AND CORONARY ANGIOGRAPHY;  Surgeon: Kathleene Hazel, MD;  Location: [facility] INVASIVE CV LAB;  Service: Cardiovascular;  Laterality: N/A;   TUBAL LIGATION     Patient Active Problem List   Diagnosis Date Noted   Unstable angina (HCC) 03/12/2023   Tobacco abuse 03/12/2023   Primary hypercholesterolemia 03/12/2023   Coronary artery disease involving native coronary artery of native heart with unstable angina pectoris (HCC)    CAD (coronary artery disease) 10/14/2015   Dilated cardiomyopathy (HCC) 10/14/2015   Chronic systolic congestive heart failure (HCC)    Esophageal reflux    Chest pain 10/12/2015   NSTEMI (non-ST elevated myocardial infarction) (HCC)  10/12/2015   Primary hypertension    Type 2 diabetes mellitus (HCC)    Low back pain    GERD (gastroesophageal reflux disease)    Systolic CHF (HCC)    Abnormal nuclear stress test    Shortness of breath     PCP: Merri Brunette, MD  REFERRING PROVIDER: Hurman Horn, MD   REFERRING DIAG: [phone] (ICD-10-CM) - Pain in left shoulder   THERAPY DIAG:  No diagnosis found.  Rationale for Evaluation and Treatment: Rehabilitation  ONSET DATE: 03/23/23  SUBJECTIVE:                                                                                                                                                                                      SUBJECTIVE  STATEMENT: Patient reports some mild improvement. The shoulder still catches her at times with certain movements.  Hand dominance: Right  PERTINENT HISTORY: Per referring physician note: PMHx: DM2, HTN, PCMH, reflux, LBP, CHF, EF 25-30% L shoulder subacromial bursitis, C spine OA changes as well, s/p CSI 03/18/23 Extensive cardiac history with recent hospitalization. CAD, chronic SCHF Increasing shoulder pain since Sept extends into shoulder into bicep and forearm, crunching in neck, occ N&T in hand. Injection 03/18/23 to L shoulder  PAIN:  Are you having pain? Yes: NPRS scale: 6/10 Pain location: L shoulder Pain description: burning Aggravating factors: combing hair, reaching for seat belt, Lying on her L shoulder any reaching up Relieving factors: Injection helped some.  PRECAUTIONS: Fall  RED FLAGS: None   WEIGHT BEARING RESTRICTIONS: No  FALLS:  Has patient fallen in last 6 months? No  LIVING ENVIRONMENT: Lives with: lives with their family Lives in: House/apartment Stairs: no issues Has following equipment at home: None  OCCUPATION: Works as a Merchandiser, retail, some lifting,   PLOF: Independent  PATIENT GOALS:Patient would like to get relief from her pain.  NEXT MD VISIT:   OBJECTIVE:  Note: Objective  measures were completed at Evaluation unless otherwise noted.  DIAGNOSTIC FINDINGS:  She reports that she did have an X ray.Unable to see results  PATIENT SURVEYS:  Quick Dash 70.3  COGNITION: Overall cognitive status: Within functional limits for tasks assessed     SENSATION: WFL  POSTURE: Flattened Thoracic spine  UPPER EXTREMITY ROM: R WNL  Active ROM Right eval Left eval  Shoulder flexion  106  Shoulder extension    Shoulder abduction  74  Shoulder adduction    Shoulder internal rotation  WNL, P  Shoulder external rotation  35  Elbow flexion    Elbow extension    Wrist flexion    Wrist extension    Wrist ulnar deviation    Wrist radial deviation    Wrist pronation    Wrist supination    (Blank rows = not tested)  UPPER EXTREMITY MMT: L shoulder MMT deferred due to pain with activation, otherwise WNL  SHOULDER SPECIAL TESTS: Impingement tests: Hawkins/Kennedy impingement test: positive  and Painful arc test: positive  SLAP lesions:  Instability tests:  Rotator cuff assessment: Empty can test: positive  Biceps assessment: Speed's test: negative  JOINT MOBILITY TESTING:  L GH joint with decreased inf and post glide  PALPATION:  Tight and TTP on R cerv paraspinals, UT, LS. TTP on post/sup aspect of L shoulder   TODAY'S TREATMENT:                                                                                                                                         DATE:  04/22/23 NuStep L4 x 5 minutes Deep STM to L UT, LS, suprapinatus, pects, GH mobs inf and post glide grade III, scapular mobs, PROM in all  directions Standing shoulder ext, Rows, ER with R tband, 2 x 10 each WATE bar, 1#, B shoulder flex AROM to 90 degrees, 2/10 pain on L, abd 10 each VASO to L shoulder 10 min, mod pressure for inflammation  04/17/23 Education Seated press ups, seated inferior glide.  PATIENT EDUCATION: Education details: POC, HEP Person educated: Patient Education  method: Medical illustrator Education comprehension: verbalized understanding and returned demonstration  HOME EXERCISE PROGRAM: [email or web address]  ASSESSMENT:  CLINICAL IMPRESSION: Patient is a 57 y.o. who was seen today for physical therapy evaluation and treatment for L shoulder bursitis. She reports onset of L shoulder pain. She saw the Dr who diagnosed bursitis with some impingement and provided an injection. Patient reports improved pain today, although still present. Upon evaluation she does show pain with L shoulder elevation, below 90 degrees, limited strength testing due to pain, decreased R GH joint mobility, positive impingement testing, and TTP along L upper shoulder.  Treatment today progressed with STM and stretch, added some stability exercises for shoulders to improve her mechanics of reaching up.  OBJECTIVE IMPAIRMENTS: decreased activity tolerance, decreased coordination, decreased ROM, decreased strength, increased muscle spasms, impaired flexibility, impaired UE functional use, improper body mechanics, and pain.   ACTIVITY LIMITATIONS: carrying, lifting, sleeping, bathing, dressing, reach over head, and hygiene/grooming  PARTICIPATION LIMITATIONS: meal prep, cleaning, laundry, shopping, community activity, and occupation  PERSONAL FACTORS: Past/current experiences are also affecting patient's functional outcome.   REHAB POTENTIAL: Good  CLINICAL DECISION MAKING: Stable/uncomplicated  EVALUATION COMPLEXITY: Low   GOALS: Goals reviewed with patient? Yes  SHORT TERM GOALS: Target date: 05/05/23  I with initial HEP Baseline: Goal status: 04/22/23-HEP provided, ongoing  LONG TERM GOALS: Target date: 06/26/23  I with final HEP Baseline:  Goal status: INITIAL  2.  Improve her Quick Dash score to <21% to achieve mild impairment. Baseline:  Goal status: INITIAL  3.  Patient will achieve full L shoulder AROM with pain < 3/10 Baseline:  Goal status:  INITIAL  4.  Patient will achieve L shoulder strength of at least 4/5 Baseline:  Goal status: INITIAL  5.  Patient will report the ability to perform all of her normal daily activities with L shoulder pain < 3/10 Baseline:  Goal status: INITIAL  PLAN:  PT FREQUENCY: 2x/week  PT DURATION: 10 weeks  PLANNED INTERVENTIONS: 97110-Therapeutic exercises, 97530- Therapeutic activity, 97112- Neuromuscular re-education, 97535- Self Care, 29518- Manual therapy, 97014- Electrical stimulation (unattended), 97016- Vasopneumatic device, 97035- Ultrasound, 84166- Ionotophoresis 4mg /ml Dexamethasone, Patient/Family education, Taping, Dry Needling, Joint mobilization, Cryotherapy, and Moist heat  PLAN FOR NEXT SESSION: Acute pain management, STM and stretch, strengthening, update HEP   Iona Beard, DPT 04/22/2023, 12:32 PM

## 2023-05-11 ENCOUNTER — Other Ambulatory Visit: Payer: Self-pay

## 2023-05-11 ENCOUNTER — Ambulatory Visit: Payer: 59 | Attending: Sports Medicine

## 2023-05-11 DIAGNOSIS — M25512 Pain in left shoulder: Secondary | ICD-10-CM | POA: Insufficient documentation

## 2023-05-11 DIAGNOSIS — R293 Abnormal posture: Secondary | ICD-10-CM | POA: Diagnosis present

## 2023-05-11 DIAGNOSIS — G8929 Other chronic pain: Secondary | ICD-10-CM | POA: Insufficient documentation

## 2023-05-11 DIAGNOSIS — R278 Other lack of coordination: Secondary | ICD-10-CM | POA: Insufficient documentation

## 2023-05-11 DIAGNOSIS — M6281 Muscle weakness (generalized): Secondary | ICD-10-CM | POA: Diagnosis present

## 2023-05-11 DIAGNOSIS — M25612 Stiffness of left shoulder, not elsewhere classified: Secondary | ICD-10-CM | POA: Insufficient documentation

## 2023-05-11 NOTE — Therapy (Signed)
 OUTPATIENT PHYSICAL THERAPY SHOULDER EVALUATION   Patient Name: Maedell Hedger MRN: [id number] DOB:1966-01-09, 58 y.o., female Today's Date: 05/11/2023  END OF SESSION:  PT End of Session - 05/11/23 1254     Visit Number 3    Date for PT Re-Evaluation 06/26/23    Progress Note Due on Visit 10    PT Start Time 1258    PT Stop Time 1342    PT Time Calculation (min) 44 min            Past Medical History:  Diagnosis Date   Coronary artery disease    Essential hypertension    GERD (gastroesophageal reflux disease)    Hyperlipidemia    Low back pain    Systolic CHF (HCC)    a. Found to have new low EF 11/2014 with abnormal nuc.   Type 2 diabetes mellitus (HCC)    Past Surgical History:  Procedure Laterality Date   CARDIAC CATHETERIZATION N/A 11/23/2014   Procedure: Right/Left Heart Cath and Coronary Angiography;  Surgeon: Candyce GORMAN Reek, MD;  Location: [facility] INVASIVE CV LAB;  Service: Cardiovascular;  Laterality: N/A;   CARDIAC CATHETERIZATION N/A 10/15/2015   Procedure: Right/Left Heart Cath and Coronary Angiography;  Surgeon: Victory LELON Sharps, MD;  Location: [facility] INVASIVE CV LAB;  Service: Cardiovascular;  Laterality: N/A;   CARDIAC CATHETERIZATION N/A 10/17/2015   Procedure: Coronary Stent Intervention;  Surgeon: Victory LELON Sharps, MD;  Location: [facility] INVASIVE CV LAB;  Service: Cardiovascular;  Laterality: N/A;   CORONARY ANGIOPLASTY     RIGHT/LEFT HEART CATH AND CORONARY ANGIOGRAPHY N/A 03/12/2023   Procedure: RIGHT/LEFT HEART CATH AND CORONARY ANGIOGRAPHY;  Surgeon: Verlin Lonni BIRCH, MD;  Location: [facility] INVASIVE CV LAB;  Service: Cardiovascular;  Laterality: N/A;   TUBAL LIGATION     Patient Active Problem List   Diagnosis Date Noted   Unstable angina (HCC) 03/12/2023   Tobacco abuse 03/12/2023   Primary hypercholesterolemia 03/12/2023   Coronary artery disease involving native coronary artery of native heart with unstable angina pectoris (HCC)    CAD (coronary  artery disease) 10/14/2015   Dilated cardiomyopathy (HCC) 10/14/2015   Chronic systolic congestive heart failure (HCC)    Esophageal reflux    Chest pain 10/12/2015   NSTEMI (non-ST elevated myocardial infarction) (HCC) 10/12/2015   Primary hypertension    Type 2 diabetes mellitus (HCC)    Low back pain    GERD (gastroesophageal reflux disease)    Systolic CHF (HCC)    Abnormal nuclear stress test    Shortness of breath     PCP: Sharps Pellet, MD  REFERRING PROVIDER: Dasie Fitch, MD   REFERRING DIAG: [phone] (ICD-10-CM) - Pain in left shoulder   THERAPY DIAG:  Abnormal posture  Muscle weakness (generalized)  Other lack of coordination  Chronic left shoulder pain  Stiffness of left shoulder, not elsewhere classified  Rationale for Evaluation and Treatment: Rehabilitation  ONSET DATE: 03/23/23  SUBJECTIVE:  SUBJECTIVE STATEMENT: Patient reports some marked improvement. Able to don/doff her shirts, jackets without pain now. Still difficult reaching behind her back . The shoulder still catches her at times with certain movements.  Hand dominance: Right  PERTINENT HISTORY: Per referring physician note: PMHx: DM2, HTN, PCMH, reflux, LBP, CHF, EF 25-30% L shoulder subacromial bursitis, C spine OA changes as well, s/p CSI 03/18/23 Extensive cardiac history with recent hospitaliz3ation. CAD, chronic SCHF Increasing shoulder pain since Sept extends into shoulder into bicep and forearm, crunching in neck, occ N&T in hand. Injection 03/18/23 to L shoulder  PAIN:  Are you having pain? Yes: NPRS scale: 6/10 Pain location: L shoulder Pain description: burning Aggravating factors: combing hair, reaching for seat belt, Lying on her L shoulder any reaching up Relieving factors: Injection helped  some.  PRECAUTIONS: Fall  RED FLAGS: None   WEIGHT BEARING RESTRICTIONS: No  FALLS:  Has patient fallen in last 6 months? No  LIVING ENVIRONMENT: Lives with: lives with their family Lives in: House/apartment Stairs: no issues Has following equipment at home: None  OCCUPATION: Works as a merchandiser, retail, some lifting,   PLOF: Independent  PATIENT GOALS:Patient would like to get relief from her pain.  NEXT MD VISIT:   OBJECTIVE:  Note: Objective measures were completed at Evaluation unless otherwise noted.  DIAGNOSTIC FINDINGS:  She reports that she did have an X ray.Unable to see results  PATIENT SURVEYS:  Quick Dash 70.3  COGNITION: Overall cognitive status: Within functional limits for tasks assessed     SENSATION: WFL  POSTURE: Flattened Thoracic spine  UPPER EXTREMITY ROM: R WNL  Active ROM Right eval Left eval  Shoulder flexion  106  Shoulder extension    Shoulder abduction  74  Shoulder adduction    Shoulder internal rotation  WNL, P  Shoulder external rotation  35  Elbow flexion    Elbow extension    Wrist flexion    Wrist extension    Wrist ulnar deviation    Wrist radial deviation    Wrist pronation    Wrist supination    (Blank rows = not tested)  UPPER EXTREMITY MMT: L shoulder MMT deferred due to pain with activation, otherwise WNL  SHOULDER SPECIAL TESTS: Impingement tests: Hawkins/Kennedy impingement test: positive  and Painful arc test: positive  SLAP lesions:  Instability tests:  Rotator cuff assessment: Empty can test: positive  Biceps assessment: Speed's test: negative  JOINT MOBILITY TESTING:  L GH joint with decreased inf and post glide  PALPATION:  Tight and TTP on R cerv paraspinals, UT, LS. TTP on post/sup aspect of L shoulder   TODAY'S TREATMENT:                                                                                                                                         DATE:  05/11/23:  Nustep level 4 x 5  min Ue's  and LE's  Supine for Inferior glides L humeral head, in supine, 3 bouts 45 sec holds, Lat glides of humeral head in combination with L shoulder flexion, with intermittent bouts of distraction L humeral head and shaking Supine L shoulder IR stretch with contract/relax  Supine B shoulder horizontal abd with strap combined with B shoulder flexion , 15 x PROM/ AAROM L shoulder all planes to tolerance Standing inst in pendulum added 3# to allow for GH jt distraction.  Towel behind back for L shoulder flex stretch Towel behind back for L shoulder IR stretch Shoulder pulley at end of session for 5 min, noted much improved motion .  04/22/23 NuStep L4 x 5 minutes Deep STM to L UT, LS, suprapinatus, pects, GH mobs inf and post glide grade III, scapular mobs, PROM in all directions Standing shoulder ext, Rows, ER with R tband, 2 x 10 each WATE bar, 1#, B shoulder flex AROM to 90 degrees, 2/10 pain on L, abd 10 each VASO to L shoulder 10 min, mod pressure for inflammation  04/17/23 Education Seated press ups, seated inferior glide.  PATIENT EDUCATION: Education details: POC, HEP Person educated: Patient Education method: Medical Illustrator Education comprehension: verbalized understanding and returned demonstration  HOME EXERCISE PROGRAM: [email or web address] Access Code: [email or web address] URL: [email or web address] Date: 05/11/2023 Prepared by: Greig Karmel Patricelli  Exercises - Doorway Pec Stretch at 60 Elevation  - 1 x daily - 7 x weekly - 3 sets - 10 reps - Circular Shoulder Pendulum with Table Support  - 1 x daily - 7 x weekly - 3 sets - 10 reps - Supine Shoulder Flexion Extension AAROM with Dowel  - 1 x daily - 7 x weekly - 3 sets - 10 reps ASSESSMENT:  CLINICAL IMPRESSION: Patient is a 58 y.o. who participated  today in physical therapy treatment for L shoulder bursitis. Overall all active and passive motions are improved since initial evaluation. Today addressed jt and  capsular mobility and instructed in additional techniques to utilize at home to continue to make gains.  Painful end feel with ER especially.  Continues to improve L shoulder function/pain with skilled physical therapy.  OBJECTIVE IMPAIRMENTS: decreased activity tolerance, decreased coordination, decreased ROM, decreased strength, increased muscle spasms, impaired flexibility, impaired UE functional use, improper body mechanics, and pain.   ACTIVITY LIMITATIONS: carrying, lifting, sleeping, bathing, dressing, reach over head, and hygiene/grooming  PARTICIPATION LIMITATIONS: meal prep, cleaning, laundry, shopping, community activity, and occupation  PERSONAL FACTORS: Past/current experiences are also affecting patient's functional outcome.   REHAB POTENTIAL: Good  CLINICAL DECISION MAKING: Stable/uncomplicated  EVALUATION COMPLEXITY: Low   GOALS: Goals reviewed with patient? Yes  SHORT TERM GOALS: Target date: 05/05/23  I with initial HEP Baseline: Goal status: 04/22/23-HEP provided, ongoing  LONG TERM GOALS: Target date: 06/26/23  I with final HEP Baseline:  Goal status: INITIAL  2.  Improve her Quick Dash score to <21% to achieve mild impairment. Baseline:  Goal status: INITIAL  3.  Patient will achieve full L shoulder AROM with pain < 3/10 Baseline:  Goal status: INITIAL  4.  Patient will achieve L shoulder strength of at least 4/5 Baseline:  Goal status: INITIAL  5.  Patient will report the ability to perform all of her normal daily activities with L shoulder pain < 3/10 Baseline:  Goal status: INITIAL  PLAN:  PT FREQUENCY: 2x/week  PT DURATION: 10 weeks  PLANNED INTERVENTIONS: 97110-Therapeutic exercises, 97530- Therapeutic activity, [id number]- Neuromuscular re-education, 97535- Self Care, [id number]- Manual therapy, 97014- Electrical  stimulation (unattended), 97016- Vasopneumatic device, [id number]- Ultrasound, 02966- Ionotophoresis 4mg /ml Dexamethasone, Patient/Family  education, Taping, Dry Needling, Joint mobilization, Cryotherapy, and Moist heat  PLAN FOR NEXT SESSION:  STM and stretch, strengthening, update HEP   Kennadee Walthour, PT,DPT, OCS 05/11/2023, 1:48 PM

## 2023-05-13 ENCOUNTER — Encounter: Payer: Self-pay | Admitting: Physical Therapy

## 2023-05-13 ENCOUNTER — Ambulatory Visit: Payer: 59 | Admitting: Physical Therapy

## 2023-05-13 DIAGNOSIS — R293 Abnormal posture: Secondary | ICD-10-CM

## 2023-05-13 DIAGNOSIS — M25612 Stiffness of left shoulder, not elsewhere classified: Secondary | ICD-10-CM

## 2023-05-13 DIAGNOSIS — G8929 Other chronic pain: Secondary | ICD-10-CM

## 2023-05-13 DIAGNOSIS — R278 Other lack of coordination: Secondary | ICD-10-CM

## 2023-05-13 DIAGNOSIS — M6281 Muscle weakness (generalized): Secondary | ICD-10-CM

## 2023-05-13 NOTE — Therapy (Signed)
 OUTPATIENT PHYSICAL THERAPY SHOULDER EVALUATION   Patient Name: Sandra Owens MRN: [id number] DOB:[birth year], 58 y.o., female Today's Date: 05/13/2023  END OF SESSION:  Past Medical History:  Diagnosis Date   Coronary artery disease    Essential hypertension    GERD (gastroesophageal reflux disease)    Hyperlipidemia    Low back pain    Systolic CHF (HCC)    a. Found to have new low EF 11/2014 with abnormal nuc.   Type 2 diabetes mellitus (HCC)    Past Surgical History:  Procedure Laterality Date   CARDIAC CATHETERIZATION N/A 11/23/2014   Procedure: Right/Left Heart Cath and Coronary Angiography;  Surgeon: Candyce GORMAN Reek, MD;  Location: [facility] INVASIVE CV LAB;  Service: Cardiovascular;  Laterality: N/A;   CARDIAC CATHETERIZATION N/A 10/15/2015   Procedure: Right/Left Heart Cath and Coronary Angiography;  Surgeon: Victory LELON Sharps, MD;  Location: [facility] INVASIVE CV LAB;  Service: Cardiovascular;  Laterality: N/A;   CARDIAC CATHETERIZATION N/A 10/17/2015   Procedure: Coronary Stent Intervention;  Surgeon: Victory LELON Sharps, MD;  Location: [facility] INVASIVE CV LAB;  Service: Cardiovascular;  Laterality: N/A;   CORONARY ANGIOPLASTY     RIGHT/LEFT HEART CATH AND CORONARY ANGIOGRAPHY N/A 03/12/2023   Procedure: RIGHT/LEFT HEART CATH AND CORONARY ANGIOGRAPHY;  Surgeon: Verlin Lonni BIRCH, MD;  Location: [facility] INVASIVE CV LAB;  Service: Cardiovascular;  Laterality: N/A;   TUBAL LIGATION     Patient Active Problem List   Diagnosis Date Noted   Unstable angina (HCC) 03/12/2023   Tobacco abuse 03/12/2023   Primary hypercholesterolemia 03/12/2023   Coronary artery disease involving native coronary artery of native heart with unstable angina pectoris (HCC)    CAD (coronary artery disease) 10/14/2015   Dilated cardiomyopathy (HCC) 10/14/2015   Chronic systolic congestive heart failure (HCC)    Esophageal reflux    Chest pain 10/12/2015   NSTEMI (non-ST elevated myocardial infarction) (HCC)  10/12/2015   Primary hypertension    Type 2 diabetes mellitus (HCC)    Low back pain    GERD (gastroesophageal reflux disease)    Systolic CHF (HCC)    Abnormal nuclear stress test    Shortness of breath     PCP: Sharps Pellet, MD  REFERRING PROVIDER: Dasie Fitch, MD   REFERRING DIAG: [phone] (ICD-10-CM) - Pain in left shoulder   THERAPY DIAG:  Abnormal posture  Muscle weakness (generalized)  Other lack of coordination  Chronic left shoulder pain  Stiffness of left shoulder, not elsewhere classified  Rationale for Evaluation and Treatment: Rehabilitation  ONSET DATE: 03/23/23  SUBJECTIVE:  SUBJECTIVE STATEMENT: Patient reports continued improvement. She is able to brush her hair with the L arm. Exercises help  Hand dominance: Right  PERTINENT HISTORY: Per referring physician note: PMHx: DM2, HTN, PCMH, reflux, LBP, CHF, EF 25-30% L shoulder subacromial bursitis, C spine OA changes as well, s/p CSI 03/18/23 Extensive cardiac history with recent hospitaliz3ation. CAD, chronic SCHF Increasing shoulder pain since Sept extends into shoulder into bicep and forearm, crunching in neck, occ N&T in hand. Injection 03/18/23 to L shoulder  PAIN:  Are you having pain? Yes: NPRS scale: 6/10 Pain location: L shoulder Pain description: burning Aggravating factors: combing hair, reaching for seat belt, Lying on her L shoulder any reaching up Relieving factors: Injection helped some.  PRECAUTIONS: Fall  RED FLAGS: None   WEIGHT BEARING RESTRICTIONS: No  FALLS:  Has patient fallen in last 6 months? No  LIVING ENVIRONMENT: Lives with: lives with their family Lives in: House/apartment Stairs: no issues Has following equipment at home: None  OCCUPATION: Works as a merchandiser, retail, some  lifting,   PLOF: Independent  PATIENT GOALS:Patient would like to get relief from her pain.  NEXT MD VISIT:   OBJECTIVE:  Note: Objective measures were completed at Evaluation unless otherwise noted.  DIAGNOSTIC FINDINGS:  She reports that she did have an X ray.Unable to see results  PATIENT SURVEYS:  Quick Dash 70.3  COGNITION: Overall cognitive status: Within functional limits for tasks assessed     SENSATION: WFL  POSTURE: Flattened Thoracic spine  UPPER EXTREMITY ROM: R WNL  Active ROM Right eval Left eval  Shoulder flexion  106  Shoulder extension    Shoulder abduction  74  Shoulder adduction    Shoulder internal rotation  WNL, P  Shoulder external rotation  35  Elbow flexion    Elbow extension    Wrist flexion    Wrist extension    Wrist ulnar deviation    Wrist radial deviation    Wrist pronation    Wrist supination    (Blank rows = not tested)  UPPER EXTREMITY MMT: L shoulder MMT deferred due to pain with activation, otherwise WNL  SHOULDER SPECIAL TESTS: Impingement tests: Hawkins/Kennedy impingement test: positive  and Painful arc test: positive  SLAP lesions:  Instability tests:  Rotator cuff assessment: Empty can test: positive  Biceps assessment: Speed's test: negative  JOINT MOBILITY TESTING:  L GH joint with decreased inf and post glide  PALPATION:  Tight and TTP on R cerv paraspinals, UT, LS. TTP on post/sup aspect of L shoulder   TODAY'S TREATMENT:                                                                                                                                         DATE:  05/13/23 NuStep L5 x 6 min Joint mobs inf and post Grade III, then hold x 30 sec, shoulder horizontal add to stretch post  capsule Assisted shoulder flex with distraction lat humeral glides Assisted shoulder abd with inf humeral glides Deep STM to Supraspinatus tendon L elbow testing as patient reports spot pain in post/lat elbow with shoulder ER-  no pain with resisted flex/ext, pro/supination, or wrist flex/ext, ulnar and radial deviation UE neural tensioning, attempted multiple positions, but she could not achieve them due to pain. Seated shoulder horizontal abd, emphasizing scap retraction, with red Tband, 10 Doorway stretch at 60, 90, 120 degrees, tolerated and felt stretch Attempted lift off test, but patient could not achieve L hand on back VASO to L shoulder 10 min, med pressure.  05/11/23:  Nustep level 4 x 5 min Ue's and LE's  Supine for Inferior glides L humeral head, in supine, 3 bouts 45 sec holds, Lat glides of humeral head in combination with L shoulder flexion, with intermittent bouts of distraction L humeral head and shaking Supine L shoulder IR stretch with contract/relax  Supine B shoulder horizontal abd with strap combined with B shoulder flexion , 15 x PROM/ AAROM L shoulder all planes to tolerance Standing inst in pendulum added 3# to allow for GH jt distraction.  Towel behind back for L shoulder flex stretch Towel behind back for L shoulder IR stretch Shoulder pulley at end of session for 5 min, noted much improved motion .  04/22/23 NuStep L4 x 5 minutes Deep STM to L UT, LS, suprapinatus, pects, GH mobs inf and post glide grade III, scapular mobs, PROM in all directions Standing shoulder ext, Rows, ER with R tband, 2 x 10 each WATE bar, 1#, B shoulder flex AROM to 90 degrees, 2/10 pain on L, abd 10 each VASO to L shoulder 10 min, mod pressure for inflammation  04/17/23 Education Seated press ups, seated inferior glide.  PATIENT EDUCATION: Education details: POC, HEP Person educated: Patient Education method: Medical Illustrator Education comprehension: verbalized understanding and returned demonstration  HOME EXERCISE PROGRAM: [email or web address] Access Code: [email or web address] URL: [email or web address] Date: 05/11/2023 Prepared by: Greig Speaks  Exercises - Doorway Pec Stretch at 60  Elevation  - 1 x daily - 7 x weekly - 3 sets - 10 reps - Circular Shoulder Pendulum with Table Support  - 1 x daily - 7 x weekly - 3 sets - 10 reps - Supine Shoulder Flexion Extension AAROM with Dowel  - 1 x daily - 7 x weekly - 3 sets - 10 reps ASSESSMENT:  CLINICAL IMPRESSION: Patient is a 58 y.o. who participated  today in physical therapy treatment for L shoulder bursitis. Overall all active and passive motions are improved since initial evaluation. Today again addressed jt and capsular mobility and instructed in additional techniques to utilize at home to continue to make gains.  She reports pain in post L shoulder capsule and post/lat elbow with IR or ER. Would benefit from continuing her capsular mobilizations as well as stability exercises to ensure appropriate G-H rhythm with elevation.  OBJECTIVE IMPAIRMENTS: decreased activity tolerance, decreased coordination, decreased ROM, decreased strength, increased muscle spasms, impaired flexibility, impaired UE functional use, improper body mechanics, and pain.   ACTIVITY LIMITATIONS: carrying, lifting, sleeping, bathing, dressing, reach over head, and hygiene/grooming  PARTICIPATION LIMITATIONS: meal prep, cleaning, laundry, shopping, community activity, and occupation  PERSONAL FACTORS: Past/current experiences are also affecting patient's functional outcome.   REHAB POTENTIAL: Good  CLINICAL DECISION MAKING: Stable/uncomplicated  EVALUATION COMPLEXITY: Low   GOALS: Goals reviewed with patient? Yes  SHORT TERM GOALS: Target date: 05/05/23  I with initial HEP Baseline:  Goal status: 04/22/23-HEP provided, ongoing  LONG TERM GOALS: Target date: 06/26/23  I with final HEP Baseline:  Goal status: INITIAL  2.  Improve her Quick Dash score to <21% to achieve mild impairment. Baseline:  Goal status: INITIAL  3.  Patient will achieve full L shoulder AROM with pain < 3/10 Baseline:  Goal status: 05/13/23- Patient reports improved  pain, but still grabs her at times, > 3/10, ongoing  4.  Patient will achieve L shoulder strength of at least 4/5 Baseline:  Goal status: INITIAL  5.  Patient will report the ability to perform all of her normal daily activities with L shoulder pain < 3/10 Baseline:  Goal status: 05/13/23-Improving ability to do her hair, but still occasionally > 3/10, ongoing  PLAN:  PT FREQUENCY: 2x/week  PT DURATION: 10 weeks  PLANNED INTERVENTIONS: 97110-Therapeutic exercises, 97530- Therapeutic activity, 97112- Neuromuscular re-education, 97535- Self Care, 02859- Manual therapy, 97014- Electrical stimulation (unattended), 97016- Vasopneumatic device, [id number]- Ultrasound, 02966- Ionotophoresis 4mg /ml Dexamethasone, Patient/Family education, Taping, Dry Needling, Joint mobilization, Cryotherapy, and Moist heat  PLAN FOR NEXT SESSION:  STM and stretch, strengthening, update HEP   Devere Mean DPT 05/13/23 3:03 PM  05/13/2023, 3:03 PM

## 2023-05-18 ENCOUNTER — Ambulatory Visit
Admission: RE | Admit: 2023-05-18 | Discharge: 2023-05-18 | Disposition: A | Discharge status: Home / Self Care | Payer: 59 | Source: Ambulatory Visit | Attending: Family Medicine | Admitting: Family Medicine

## 2023-05-18 DIAGNOSIS — Z1231 Encounter for screening mammogram for malignant neoplasm of breast: Secondary | ICD-10-CM

## 2023-05-19 ENCOUNTER — Ambulatory Visit: Payer: 59 | Admitting: Physical Therapy

## 2023-05-19 ENCOUNTER — Encounter: Payer: Self-pay | Admitting: Physical Therapy

## 2023-05-19 DIAGNOSIS — R293 Abnormal posture: Secondary | ICD-10-CM

## 2023-05-19 DIAGNOSIS — R278 Other lack of coordination: Secondary | ICD-10-CM

## 2023-05-19 DIAGNOSIS — M25612 Stiffness of left shoulder, not elsewhere classified: Secondary | ICD-10-CM

## 2023-05-19 DIAGNOSIS — M6281 Muscle weakness (generalized): Secondary | ICD-10-CM

## 2023-05-19 DIAGNOSIS — G8929 Other chronic pain: Secondary | ICD-10-CM

## 2023-05-19 NOTE — Therapy (Signed)
 OUTPATIENT PHYSICAL THERAPY SHOULDER EVALUATION   Patient Name: Sandra Owens MRN: [id number] DOB:01-07-1966, 58 y.o., female Today's Date: 05/19/2023  END OF SESSION:  Past Medical History:  Diagnosis Date   Coronary artery disease    Essential hypertension    GERD (gastroesophageal reflux disease)    Hyperlipidemia    Low back pain    Systolic CHF (HCC)    a. Found to have new low EF 11/2014 with abnormal nuc.   Type 2 diabetes mellitus (HCC)    Past Surgical History:  Procedure Laterality Date   CARDIAC CATHETERIZATION N/A 11/23/2014   Procedure: Right/Left Heart Cath and Coronary Angiography;  Surgeon: Candyce GORMAN Reek, MD;  Location: [facility] INVASIVE CV LAB;  Service: Cardiovascular;  Laterality: N/A;   CARDIAC CATHETERIZATION N/A 10/15/2015   Procedure: Right/Left Heart Cath and Coronary Angiography;  Surgeon: Victory LELON Sharps, MD;  Location: [facility] INVASIVE CV LAB;  Service: Cardiovascular;  Laterality: N/A;   CARDIAC CATHETERIZATION N/A 10/17/2015   Procedure: Coronary Stent Intervention;  Surgeon: Victory LELON Sharps, MD;  Location: [facility] INVASIVE CV LAB;  Service: Cardiovascular;  Laterality: N/A;   CORONARY ANGIOPLASTY     RIGHT/LEFT HEART CATH AND CORONARY ANGIOGRAPHY N/A 03/12/2023   Procedure: RIGHT/LEFT HEART CATH AND CORONARY ANGIOGRAPHY;  Surgeon: Verlin Lonni BIRCH, MD;  Location: [facility] INVASIVE CV LAB;  Service: Cardiovascular;  Laterality: N/A;   TUBAL LIGATION     Patient Active Problem List   Diagnosis Date Noted   Unstable angina (HCC) 03/12/2023   Tobacco abuse 03/12/2023   Primary hypercholesterolemia 03/12/2023   Coronary artery disease involving native coronary artery of native heart with unstable angina pectoris (HCC)    CAD (coronary artery disease) 10/14/2015   Dilated cardiomyopathy (HCC) 10/14/2015   Chronic systolic congestive heart failure (HCC)    Esophageal reflux    Chest pain 10/12/2015   NSTEMI (non-ST elevated myocardial infarction) (HCC)  10/12/2015   Primary hypertension    Type 2 diabetes mellitus (HCC)    Low back pain    GERD (gastroesophageal reflux disease)    Systolic CHF (HCC)    Abnormal nuclear stress test    Shortness of breath     PCP: Sharps Pellet, MD  REFERRING PROVIDER: Dasie Fitch, MD   REFERRING DIAG: [phone] (ICD-10-CM) - Pain in left shoulder   THERAPY DIAG:  Abnormal posture  Muscle weakness (generalized)  Other lack of coordination  Chronic left shoulder pain  Stiffness of left shoulder, not elsewhere classified  Rationale for Evaluation and Treatment: Rehabilitation  ONSET DATE: 03/23/23  SUBJECTIVE:  SUBJECTIVE STATEMENT: Patient reports continued improvement in her shoulder pain. It will sometimes wake her up if she rolls onto the L. It feels better as she gets moving.  Hand dominance: Right  PERTINENT HISTORY: Per referring physician note: PMHx: DM2, HTN, PCMH, reflux, LBP, CHF, EF 25-30% L shoulder subacromial bursitis, C spine OA changes as well, s/p CSI 03/18/23 Extensive cardiac history with recent hospitaliz3ation. CAD, chronic SCHF Increasing shoulder pain since Sept extends into shoulder into bicep and forearm, crunching in neck, occ N&T in hand. Injection 03/18/23 to L shoulder  PAIN:  Are you having pain? Yes: NPRS scale: 6/10 Pain location: L shoulder Pain description: burning Aggravating factors: combing hair, reaching for seat belt, Lying on her L shoulder any reaching up Relieving factors: Injection helped some.  PRECAUTIONS: Fall  RED FLAGS: None   WEIGHT BEARING RESTRICTIONS: No  FALLS:  Has patient fallen in last 6 months? No  LIVING ENVIRONMENT: Lives with: lives with their family Lives in: House/apartment Stairs: no issues Has following equipment at home:  None  OCCUPATION: Works as a merchandiser, retail, some lifting,   PLOF: Independent  PATIENT GOALS:Patient would like to get relief from her pain.  NEXT MD VISIT:   OBJECTIVE:  Note: Objective measures were completed at Evaluation unless otherwise noted.  DIAGNOSTIC FINDINGS:  She reports that she did have an X ray.Unable to see results  PATIENT SURVEYS:  Quick Dash 70.3  COGNITION: Overall cognitive status: Within functional limits for tasks assessed     SENSATION: WFL  POSTURE: Flattened Thoracic spine  UPPER EXTREMITY ROM: R WNL  Active ROM Right eval Left eval  Shoulder flexion  106  Shoulder extension    Shoulder abduction  74  Shoulder adduction    Shoulder internal rotation  WNL, P  Shoulder external rotation  35  Elbow flexion    Elbow extension    Wrist flexion    Wrist extension    Wrist ulnar deviation    Wrist radial deviation    Wrist pronation    Wrist supination    (Blank rows = not tested)  UPPER EXTREMITY MMT: L shoulder MMT deferred due to pain with activation, otherwise WNL  SHOULDER SPECIAL TESTS: Impingement tests: Hawkins/Kennedy impingement test: positive  and Painful arc test: positive  SLAP lesions:  Instability tests:  Rotator cuff assessment: Empty can test: positive  Biceps assessment: Speed's test: negative  JOINT MOBILITY TESTING:  L GH joint with decreased inf and post glide  PALPATION:  Tight and TTP on R cerv paraspinals, UT, LS. TTP on post/sup aspect of L shoulder   TODAY'S TREATMENT:                                                                                                                                         DATE:  05/19/23 NuStep L5 x 6 minutes Scap protraction against ball on wall, 2 x  5 circles each direction. 2 hand ball on wall push in diamond pattern. Scap protraction with Red Tband x 5 for HEP 3 way shoulder elevation with 1# weights 10 reps Standing horizontal shoulder abd against Red Tband x  10 Repeat in diagonals x 10 each way STM to L UT, LS, parascap muscles with GH joint mobs grade III into inf and post glide, stretch to post capsule and PROM. Standing thoracic rotation in plank position, emphasizing stretch to post capsule of L shoulder.  05/13/23 NuStep L5 x 6 min Joint mobs inf and post Grade III, then hold x 30 sec, shoulder horizontal add to stretch post capsule Assisted shoulder flex with distraction lat humeral glides Assisted shoulder abd with inf humeral glides Deep STM to Supraspinatus tendon L elbow testing as patient reports spot pain in post/lat elbow with shoulder ER- no pain with resisted flex/ext, pro/supination, or wrist flex/ext, ulnar and radial deviation UE neural tensioning, attempted multiple positions, but she could not achieve them due to pain. Seated shoulder horizontal abd, emphasizing scap retraction, with red Tband, 10 Doorway stretch at 60, 90, 120 degrees, tolerated and felt stretch Attempted lift off test, but patient could not achieve L hand on back VASO to L shoulder 10 min, med pressure.  05/11/23:  Nustep level 4 x 5 min Ue's and LE's  Supine for Inferior glides L humeral head, in supine, 3 bouts 45 sec holds, Lat glides of humeral head in combination with L shoulder flexion, with intermittent bouts of distraction L humeral head and shaking Supine L shoulder IR stretch with contract/relax  Supine B shoulder horizontal abd with strap combined with B shoulder flexion , 15 x PROM/ AAROM L shoulder all planes to tolerance Standing inst in pendulum added 3# to allow for GH jt distraction.  Towel behind back for L shoulder flex stretch Towel behind back for L shoulder IR stretch Shoulder pulley at end of session for 5 min, noted much improved motion .  04/22/23 NuStep L4 x 5 minutes Deep STM to L UT, LS, suprapinatus, pects, GH mobs inf and post glide grade III, scapular mobs, PROM in all directions Standing shoulder ext, Rows, ER with R  tband, 2 x 10 each WATE bar, 1#, B shoulder flex AROM to 90 degrees, 2/10 pain on L, abd 10 each VASO to L shoulder 10 min, mod pressure for inflammation  04/17/23 Education Seated press ups, seated inferior glide.  PATIENT EDUCATION: Education details: POC, HEP Person educated: Patient Education method: Medical Illustrator Education comprehension: verbalized understanding and returned demonstration  HOME EXERCISE PROGRAM: [email or web address] Access Code: [email or web address] URL: [email or web address] Date: 05/11/2023 Prepared by: Greig Speaks  Exercises - Doorway Pec Stretch at 60 Elevation  - 1 x daily - 7 x weekly - 3 sets - 10 reps - Circular Shoulder Pendulum with Table Support  - 1 x daily - 7 x weekly - 3 sets - 10 reps - Supine Shoulder Flexion Extension AAROM with Dowel  - 1 x daily - 7 x weekly - 3 sets - 10 reps ASSESSMENT:  CLINICAL IMPRESSION: Patient is a 58 y.o. who participated  today in physical therapy treatment for L shoulder bursitis. Overall all active and passive motions are improved since initial evaluation.  Treatment focused on additional strengthening for all shoulder muscles, updating her HEP, then continuing with STM and stretch to prevent re-occurrence of impingement. She reports continuing improvement in her pain.  OBJECTIVE IMPAIRMENTS: decreased activity tolerance, decreased coordination, decreased ROM, decreased strength, increased  muscle spasms, impaired flexibility, impaired UE functional use, improper body mechanics, and pain.   ACTIVITY LIMITATIONS: carrying, lifting, sleeping, bathing, dressing, reach over head, and hygiene/grooming  PARTICIPATION LIMITATIONS: meal prep, cleaning, laundry, shopping, community activity, and occupation  PERSONAL FACTORS: Past/current experiences are also affecting patient's functional outcome.   REHAB POTENTIAL: Good  CLINICAL DECISION MAKING: Stable/uncomplicated  EVALUATION COMPLEXITY:  Low   GOALS: Goals reviewed with patient? Yes  SHORT TERM GOALS: Target date: 05/05/23  I with initial HEP Baseline: Goal status: 04/22/23-HEP provided, ongoing  LONG TERM GOALS: Target date: 06/26/23  I with final HEP Baseline:  Goal status: INITIAL  2.  Improve her Quick Dash score to <21% to achieve mild impairment. Baseline:  Goal status: INITIAL  3.  Patient will achieve full L shoulder AROM with pain < 3/10 Baseline:  Goal status: 05/13/23- Patient reports improved pain, but still grabs her at times, > 3/10, ongoing  4.  Patient will achieve L shoulder strength of at least 4/5 Baseline:  Goal status: INITIAL  5.  Patient will report the ability to perform all of her normal daily activities with L shoulder pain < 3/10 Baseline:  Goal status: 05/13/23-Improving ability to do her hair, but still occasionally > 3/10, ongoing  PLAN:  PT FREQUENCY: 2x/week  PT DURATION: 10 weeks  PLANNED INTERVENTIONS: 97110-Therapeutic exercises, 97530- Therapeutic activity, 97112- Neuromuscular re-education, 97535- Self Care, 02859- Manual therapy, 97014- Electrical stimulation (unattended), 97016- Vasopneumatic device, [id number]- Ultrasound, 02966- Ionotophoresis 4mg /ml Dexamethasone, Patient/Family education, Taping, Dry Needling, Joint mobilization, Cryotherapy, and Moist heat  PLAN FOR NEXT SESSION:  STM and stretch, strengthening, update HEP   Devere Mean DPT 05/19/23 2:05 PM  05/19/2023, 2:05 PM

## 2023-05-21 ENCOUNTER — Ambulatory Visit: Payer: 59 | Admitting: Physical Therapy

## 2023-05-21 ENCOUNTER — Encounter: Payer: Self-pay | Admitting: Physical Therapy

## 2023-05-21 DIAGNOSIS — G8929 Other chronic pain: Secondary | ICD-10-CM

## 2023-05-21 DIAGNOSIS — M6281 Muscle weakness (generalized): Secondary | ICD-10-CM

## 2023-05-21 DIAGNOSIS — R293 Abnormal posture: Secondary | ICD-10-CM | POA: Diagnosis not present

## 2023-05-21 DIAGNOSIS — M25612 Stiffness of left shoulder, not elsewhere classified: Secondary | ICD-10-CM

## 2023-05-21 NOTE — Therapy (Signed)
OUTPATIENT PHYSICAL THERAPY SHOULDER EVALUATION   Patient Name: Sandra Owens MRN: [id number] DOB:14-Aug-1965, 58 y.o., female Today's Date: 05/21/2023  END OF SESSION:  Past Medical History:  Diagnosis Date   Coronary artery disease    Essential hypertension    GERD (gastroesophageal reflux disease)    Hyperlipidemia    Low back pain    Systolic CHF (HCC)    a. Found to have new low EF 11/2014 with abnormal nuc.   Type 2 diabetes mellitus (HCC)    Past Surgical History:  Procedure Laterality Date   CARDIAC CATHETERIZATION N/A 11/23/2014   Procedure: Right/Left Heart Cath and Coronary Angiography;  Surgeon: Corky Crafts, MD;  Location: [facility] INVASIVE CV LAB;  Service: Cardiovascular;  Laterality: N/A;   CARDIAC CATHETERIZATION N/A 10/15/2015   Procedure: Right/Left Heart Cath and Coronary Angiography;  Surgeon: Lyn Records, MD;  Location: [facility] INVASIVE CV LAB;  Service: Cardiovascular;  Laterality: N/A;   CARDIAC CATHETERIZATION N/A 10/17/2015   Procedure: Coronary Stent Intervention;  Surgeon: Lyn Records, MD;  Location: [facility] INVASIVE CV LAB;  Service: Cardiovascular;  Laterality: N/A;   CORONARY ANGIOPLASTY     RIGHT/LEFT HEART CATH AND CORONARY ANGIOGRAPHY N/A 03/12/2023   Procedure: RIGHT/LEFT HEART CATH AND CORONARY ANGIOGRAPHY;  Surgeon: Kathleene Hazel, MD;  Location: [facility] INVASIVE CV LAB;  Service: Cardiovascular;  Laterality: N/A;   TUBAL LIGATION     Patient Active Problem List   Diagnosis Date Noted   Unstable angina (HCC) 03/12/2023   Tobacco abuse 03/12/2023   Primary hypercholesterolemia 03/12/2023   Coronary artery disease involving native coronary artery of native heart with unstable angina pectoris (HCC)    CAD (coronary artery disease) 10/14/2015   Dilated cardiomyopathy (HCC) 10/14/2015   Chronic systolic congestive heart failure (HCC)    Esophageal reflux    Chest pain 10/12/2015   NSTEMI (non-ST elevated myocardial infarction) (HCC)  10/12/2015   Primary hypertension    Type 2 diabetes mellitus (HCC)    Low back pain    GERD (gastroesophageal reflux disease)    Systolic CHF (HCC)    Abnormal nuclear stress test    Shortness of breath     PCP: Merri Brunette, MD  REFERRING PROVIDER: Hurman Horn, MD   REFERRING DIAG: [phone] (ICD-10-CM) - Pain in left shoulder   THERAPY DIAG:  Abnormal posture  Muscle weakness (generalized)  Chronic left shoulder pain  Stiffness of left shoulder, not elsewhere classified  Rationale for Evaluation and Treatment: Rehabilitation  ONSET DATE: 03/23/23  SUBJECTIVE:  SUBJECTIVE STATEMENT: "I feel well, I fele good" Pt reports improved functional ability  Hand dominance: Right  PERTINENT HISTORY: Per referring physician note: PMHx: DM2, HTN, PCMH, reflux, LBP, CHF, EF 25-30% L shoulder subacromial bursitis, C spine OA changes as well, s/p CSI 03/18/23 Extensive cardiac history with recent hospitaliz3ation. CAD, chronic SCHF Increasing shoulder pain since Sept extends into shoulder into bicep and forearm, crunching in neck, occ N&T in hand. Injection 03/18/23 to L shoulder  PAIN:  Are you having pain? Yes: NPRS scale: 0/10 Pain location: L shoulder Pain description: burning Aggravating factors: combing hair, reaching for seat belt, Lying on her L shoulder any reaching up Relieving factors: Injection helped some.  PRECAUTIONS: Fall  RED FLAGS: None   WEIGHT BEARING RESTRICTIONS: No  FALLS:  Has patient fallen in last 6 months? No  LIVING ENVIRONMENT: Lives with: lives with their family Lives in: House/apartment Stairs: no issues Has following equipment at home: None  OCCUPATION: Works as a Merchandiser, retail, some lifting,   PLOF: Independent  PATIENT GOALS:Patient would like to  get relief from her pain.  NEXT MD VISIT:   OBJECTIVE:  Note: Objective measures were completed at Evaluation unless otherwise noted.  DIAGNOSTIC FINDINGS:  She reports that she did have an X ray.Unable to see results  PATIENT SURVEYS:  Quick Dash 70.3  COGNITION: Overall cognitive status: Within functional limits for tasks assessed     SENSATION: WFL  POSTURE: Flattened Thoracic spine  UPPER EXTREMITY ROM: R WNL  Active ROM Right eval Left eval  Shoulder flexion  106  Shoulder extension    Shoulder abduction  74  Shoulder adduction    Shoulder internal rotation  WNL, P  Shoulder external rotation  35  Elbow flexion    Elbow extension    Wrist flexion    Wrist extension    Wrist ulnar deviation    Wrist radial deviation    Wrist pronation    Wrist supination    (Blank rows = not tested)  UPPER EXTREMITY MMT: L shoulder MMT deferred due to pain with activation, otherwise WNL  SHOULDER SPECIAL TESTS: Impingement tests: Hawkins/Kennedy impingement test: positive  and Painful arc test: positive  SLAP lesions:  Instability tests:  Rotator cuff assessment: Empty can test: positive  Biceps assessment: Speed's test: negative  JOINT MOBILITY TESTING:  L GH joint with decreased inf and post glide  PALPATION:  Tight and TTP on R cerv paraspinals, UT, LS. TTP on post/sup aspect of L shoulder   TODAY'S TREATMENT:                                                                                                                                         DATE:  05/21/23 UBE L1 x 3 min each Rows green 2x10 Shoulder Ext red 2x10 Shoulder ER yellow 2x10 Shoulder IR red 2x10 Shoulder Flex & Abd 1lb x10 Triceps Ext  lb 15lb 3x10 STM to L UT, LS, parascap muscles with GH joint mobs grade III into inf and post glide, stretch to post capsule and PROM.  05/19/23 NuStep L5 x 6 minutes Scap protraction against ball on wall, 2 x 5 circles each direction. 2 hand ball on wall push  in diamond pattern. Scap protraction with Red Tband x 5 for HEP 3 way shoulder elevation with 1# weights 10 reps Standing horizontal shoulder abd against Red Tband x 10 Repeat in diagonals x 10 each way STM to L UT, LS, parascap muscles with GH joint mobs grade III into inf and post glide, stretch to post capsule and PROM. Standing thoracic rotation in plank position, emphasizing stretch to post capsule of L shoulder.  05/13/23 NuStep L5 x 6 min Joint mobs inf and post Grade III, then hold x 30 sec, shoulder horizontal add to stretch post capsule Assisted shoulder flex with distraction lat humeral glides Assisted shoulder abd with inf humeral glides Deep STM to Supraspinatus tendon L elbow testing as patient reports spot pain in post/lat elbow with shoulder ER- no pain with resisted flex/ext, pro/supination, or wrist flex/ext, ulnar and radial deviation UE neural tensioning, attempted multiple positions, but she could not achieve them due to pain. Seated shoulder horizontal abd, emphasizing scap retraction, with red Tband, 10 Doorway stretch at 60, 90, 120 degrees, tolerated and felt stretch Attempted lift off test, but patient could not achieve L hand on back VASO to L shoulder 10 min, med pressure.  05/11/23:  Nustep level 4 x 5 min Ue's and LE's  Supine for Inferior glides L humeral head, in supine, 3 bouts 45 sec holds, Lat glides of humeral head in combination with L shoulder flexion, with intermittent bouts of distraction L humeral head and shaking Supine L shoulder IR stretch with contract/relax  Supine B shoulder horizontal abd with strap combined with B shoulder flexion , 15 x PROM/ AAROM L shoulder all planes to tolerance Standing inst in pendulum added 3# to allow for GH jt distraction.  Towel behind back for L shoulder flex stretch Towel behind back for L shoulder IR stretch Shoulder pulley at end of session for 5 min, noted much improved motion .  04/22/23 NuStep L4 x 5  minutes Deep STM to L UT, LS, suprapinatus, pects, GH mobs inf and post glide grade III, scapular mobs, PROM in all directions Standing shoulder ext, Rows, ER with R tband, 2 x 10 each WATE bar, 1#, B shoulder flex AROM to 90 degrees, 2/10 pain on L, abd 10 each VASO to L shoulder 10 min, mod pressure for inflammation  04/17/23 Education Seated press ups, seated inferior glide.  PATIENT EDUCATION: Education details: POC, HEP Person educated: Patient Education method: Medical illustrator Education comprehension: verbalized understanding and returned demonstration  HOME EXERCISE PROGRAM: [email or web address] Access Code: [email or web address] URL: [email or web address] Date: 05/11/2023 Prepared by: Linton Rump Speaks  Exercises - Doorway Pec Stretch at 60 Elevation  - 1 x daily - 7 x weekly - 3 sets - 10 reps - Circular Shoulder Pendulum with Table Support  - 1 x daily - 7 x weekly - 3 sets - 10 reps - Supine Shoulder Flexion Extension AAROM with Dowel  - 1 x daily - 7 x weekly - 3 sets - 10 reps ASSESSMENT:  CLINICAL IMPRESSION: Patient is a 58 y.o. who participated  today in physical therapy treatment for L shoulder bursitis. Treatment focused on additional strengthening for all shoulder muscles then continuing with STM  and stretch to prevent re-occurrence of impingement. Lateral L deltoid discomfort reported with row, flexion, and ER. She reports continuing improvement with her pain and mobility. Cues needed to relax with PROM  OBJECTIVE IMPAIRMENTS: decreased activity tolerance, decreased coordination, decreased ROM, decreased strength, increased muscle spasms, impaired flexibility, impaired UE functional use, improper body mechanics, and pain.   ACTIVITY LIMITATIONS: carrying, lifting, sleeping, bathing, dressing, reach over head, and hygiene/grooming  PARTICIPATION LIMITATIONS: meal prep, cleaning, laundry, shopping, community activity, and occupation  PERSONAL FACTORS: Past/current  experiences are also affecting patient's functional outcome.   REHAB POTENTIAL: Good  CLINICAL DECISION MAKING: Stable/uncomplicated  EVALUATION COMPLEXITY: Low   GOALS: Goals reviewed with patient? Yes  SHORT TERM GOALS: Target date: 05/05/23  I with initial HEP Baseline: Goal status: 04/22/23-HEP provided, ongoing  LONG TERM GOALS: Target date: 06/26/23  I with final HEP Baseline:  Goal status: INITIAL  2.  Improve her Quick Dash score to <21% to achieve mild impairment. Baseline:  Goal status: INITIAL  3.  Patient will achieve full L shoulder AROM with pain < 3/10 Baseline:  Goal status: 05/13/23- Patient reports improved pain, but still grabs her at times, > 3/10, ongoing  4.  Patient will achieve L shoulder strength of at least 4/5 Baseline:  Goal status: INITIAL  5.  Patient will report the ability to perform all of her normal daily activities with L shoulder pain < 3/10 Baseline:  Goal status: 05/13/23-Improving ability to do her hair, but still occasionally > 3/10, ongoing  PLAN:  PT FREQUENCY: 2x/week  PT DURATION: 10 weeks  PLANNED INTERVENTIONS: 97110-Therapeutic exercises, 97530- Therapeutic activity, 97112- Neuromuscular re-education, 97535- Self Care, 60454- Manual therapy, 97014- Electrical stimulation (unattended), 97016- Vasopneumatic device, 97035- Ultrasound, 09811- Ionotophoresis 4mg /ml Dexamethasone, Patient/Family education, Taping, Dry Needling, Joint mobilization, Cryotherapy, and Moist heat  PLAN FOR NEXT SESSION:  STM and stretch, strengthening, update HEP   Oley Balm DPT 05/21/23 12:56 PM  05/21/2023, 12:56 PM

## 2023-05-25 ENCOUNTER — Encounter: Payer: Self-pay | Admitting: Physical Therapy

## 2023-05-25 ENCOUNTER — Ambulatory Visit: Payer: 59 | Admitting: Physical Therapy

## 2023-05-25 DIAGNOSIS — M25612 Stiffness of left shoulder, not elsewhere classified: Secondary | ICD-10-CM

## 2023-05-25 DIAGNOSIS — M6281 Muscle weakness (generalized): Secondary | ICD-10-CM

## 2023-05-25 DIAGNOSIS — R293 Abnormal posture: Secondary | ICD-10-CM

## 2023-05-25 DIAGNOSIS — G8929 Other chronic pain: Secondary | ICD-10-CM

## 2023-05-25 DIAGNOSIS — R278 Other lack of coordination: Secondary | ICD-10-CM

## 2023-05-25 NOTE — Therapy (Signed)
OUTPATIENT PHYSICAL THERAPY SHOULDER EVALUATION   Patient Name: Sandra Owens MRN: [id number] DOB:Sep 30, 1965, 58 y.o., female Today's Date: 05/25/2023  END OF SESSION:  Past Medical History:  Diagnosis Date   Coronary artery disease    Essential hypertension    GERD (gastroesophageal reflux disease)    Hyperlipidemia    Low back pain    Systolic CHF (HCC)    a. Found to have new low EF 11/2014 with abnormal nuc.   Type 2 diabetes mellitus (HCC)    Past Surgical History:  Procedure Laterality Date   CARDIAC CATHETERIZATION N/A 11/23/2014   Procedure: Right/Left Heart Cath and Coronary Angiography;  Surgeon: Corky Crafts, MD;  Location: [facility] INVASIVE CV LAB;  Service: Cardiovascular;  Laterality: N/A;   CARDIAC CATHETERIZATION N/A 10/15/2015   Procedure: Right/Left Heart Cath and Coronary Angiography;  Surgeon: Lyn Records, MD;  Location: [facility] INVASIVE CV LAB;  Service: Cardiovascular;  Laterality: N/A;   CARDIAC CATHETERIZATION N/A 10/17/2015   Procedure: Coronary Stent Intervention;  Surgeon: Lyn Records, MD;  Location: [facility] INVASIVE CV LAB;  Service: Cardiovascular;  Laterality: N/A;   CORONARY ANGIOPLASTY     RIGHT/LEFT HEART CATH AND CORONARY ANGIOGRAPHY N/A 03/12/2023   Procedure: RIGHT/LEFT HEART CATH AND CORONARY ANGIOGRAPHY;  Surgeon: Kathleene Hazel, MD;  Location: [facility] INVASIVE CV LAB;  Service: Cardiovascular;  Laterality: N/A;   TUBAL LIGATION     Patient Active Problem List   Diagnosis Date Noted   Unstable angina (HCC) 03/12/2023   Tobacco abuse 03/12/2023   Primary hypercholesterolemia 03/12/2023   Coronary artery disease involving native coronary artery of native heart with unstable angina pectoris (HCC)    CAD (coronary artery disease) 10/14/2015   Dilated cardiomyopathy (HCC) 10/14/2015   Chronic systolic congestive heart failure (HCC)    Esophageal reflux    Chest pain 10/12/2015   NSTEMI (non-ST elevated myocardial infarction) (HCC)  10/12/2015   Primary hypertension    Type 2 diabetes mellitus (HCC)    Low back pain    GERD (gastroesophageal reflux disease)    Systolic CHF (HCC)    Abnormal nuclear stress test    Shortness of breath     PCP: Merri Brunette, MD  REFERRING PROVIDER: Hurman Horn, MD   REFERRING DIAG: [phone] (ICD-10-CM) - Pain in left shoulder   THERAPY DIAG:  Abnormal posture  Chronic left shoulder pain  Other lack of coordination  Muscle weakness (generalized)  Stiffness of left shoulder, not elsewhere classified  Rationale for Evaluation and Treatment: Rehabilitation  ONSET DATE: 03/23/23  SUBJECTIVE:  SUBJECTIVE STATEMENT: Shoulder hurt all weekend, its ok today  Hand dominance: Right  PERTINENT HISTORY: Per referring physician note: PMHx: DM2, HTN, PCMH, reflux, LBP, CHF, EF 25-30% L shoulder subacromial bursitis, C spine OA changes as well, s/p CSI 03/18/23 Extensive cardiac history with recent hospitaliz3ation. CAD, chronic SCHF Increasing shoulder pain since Sept extends into shoulder into bicep and forearm, crunching in neck, occ N&T in hand. Injection 03/18/23 to L shoulder  PAIN:  Are you having pain? Yes: NPRS scale: 0/10 Pain location: L shoulder Pain description: burning Aggravating factors: combing hair, reaching for seat belt, Lying on her L shoulder any reaching up Relieving factors: Injection helped some.  PRECAUTIONS: Fall  RED FLAGS: None   WEIGHT BEARING RESTRICTIONS: No  FALLS:  Has patient fallen in last 6 months? No  LIVING ENVIRONMENT: Lives with: lives with their family Lives in: House/apartment Stairs: no issues Has following equipment at home: None  OCCUPATION: Works as a Merchandiser, retail, some lifting,   PLOF: Independent  PATIENT GOALS:Patient would like  to get relief from her pain.  NEXT MD VISIT:   OBJECTIVE:  Note: Objective measures were completed at Evaluation unless otherwise noted.  DIAGNOSTIC FINDINGS:  She reports that she did have an X ray.Unable to see results  PATIENT SURVEYS:  Quick Dash 70.3  COGNITION: Overall cognitive status: Within functional limits for tasks assessed     SENSATION: WFL  POSTURE: Flattened Thoracic spine  UPPER EXTREMITY ROM: R WNL  Active ROM Right eval Left eval  Shoulder flexion  106  Shoulder extension    Shoulder abduction  74  Shoulder adduction    Shoulder internal rotation  WNL, P  Shoulder external rotation  35  Elbow flexion    Elbow extension    Wrist flexion    Wrist extension    Wrist ulnar deviation    Wrist radial deviation    Wrist pronation    Wrist supination    (Blank rows = not tested)  UPPER EXTREMITY MMT: L shoulder MMT deferred due to pain with activation, otherwise WNL  SHOULDER SPECIAL TESTS: Impingement tests: Hawkins/Kennedy impingement test: positive  and Painful arc test: positive  SLAP lesions:  Instability tests:  Rotator cuff assessment: Empty can test: positive  Biceps assessment: Speed's test: negative  JOINT MOBILITY TESTING:  L GH joint with decreased inf and post glide  PALPATION:  Tight and TTP on R cerv paraspinals, UT, LS. TTP on post/sup aspect of L shoulder   TODAY'S TREATMENT:                                                                                                                                         DATE:  05/25/23 UBE L1.5 x 3 min each AAROM 2lb WaTE Flex, Ext, IR up back x10 Shoulder Ext 5lb 2x10  Shoulder Flex & Abd 1lb 2x10 Shoulder ER red 2x10 Rows & Lats  20lb 2x10 each STM to L UT, LS, parascap muscles with GH joint mobs grade III into inf and post glide, stretch to post capsule and PROM.  05/21/23 UBE L1 x 3 min each Rows green 2x10 Shoulder Ext red 2x10 Shoulder ER yellow 2x10 Shoulder IR red  2x10 Shoulder Flex & Abd 1lb x10 Triceps Ext lb 15lb 3x10 STM to L UT, LS, parascap muscles with GH joint mobs grade III into inf and post glide, stretch to post capsule and PROM.  05/19/23 NuStep L5 x 6 minutes Scap protraction against ball on wall, 2 x 5 circles each direction. 2 hand ball on wall push in diamond pattern. Scap protraction with Red Tband x 5 for HEP 3 way shoulder elevation with 1# weights 10 reps Standing horizontal shoulder abd against Red Tband x 10 Repeat in diagonals x 10 each way STM to L UT, LS, parascap muscles with GH joint mobs grade III into inf and post glide, stretch to post capsule and PROM. Standing thoracic rotation in plank position, emphasizing stretch to post capsule of L shoulder.  05/13/23 NuStep L5 x 6 min Joint mobs inf and post Grade III, then hold x 30 sec, shoulder horizontal add to stretch post capsule Assisted shoulder flex with distraction lat humeral glides Assisted shoulder abd with inf humeral glides Deep STM to Supraspinatus tendon L elbow testing as patient reports spot pain in post/lat elbow with shoulder ER- no pain with resisted flex/ext, pro/supination, or wrist flex/ext, ulnar and radial deviation UE neural tensioning, attempted multiple positions, but she could not achieve them due to pain. Seated shoulder horizontal abd, emphasizing scap retraction, with red Tband, 10 Doorway stretch at 60, 90, 120 degrees, tolerated and felt stretch Attempted lift off test, but patient could not achieve L hand on back VASO to L shoulder 10 min, med pressure.  05/11/23:  Nustep level 4 x 5 min Ue's and LE's  Supine for Inferior glides L humeral head, in supine, 3 bouts 45 sec holds, Lat glides of humeral head in combination with L shoulder flexion, with intermittent bouts of distraction L humeral head and shaking Supine L shoulder IR stretch with contract/relax  Supine B shoulder horizontal abd with strap combined with B shoulder flexion , 15  x PROM/ AAROM L shoulder all planes to tolerance Standing inst in pendulum added 3# to allow for GH jt distraction.  Towel behind back for L shoulder flex stretch Towel behind back for L shoulder IR stretch Shoulder pulley at end of session for 5 min, noted much improved motion .  04/22/23 NuStep L4 x 5 minutes Deep STM to L UT, LS, suprapinatus, pects, GH mobs inf and post glide grade III, scapular mobs, PROM in all directions Standing shoulder ext, Rows, ER with R tband, 2 x 10 each WATE bar, 1#, B shoulder flex AROM to 90 degrees, 2/10 pain on L, abd 10 each VASO to L shoulder 10 min, mod pressure for inflammation  04/17/23 Education Seated press ups, seated inferior glide.  PATIENT EDUCATION: Education details: POC, HEP Person educated: Patient Education method: Medical illustrator Education comprehension: verbalized understanding and returned demonstration  HOME EXERCISE PROGRAM: [email or web address] Access Code: [email or web address] URL: [email or web address] Date: 05/11/2023 Prepared by: Caralee Ates  Exercises - Doorway Pec Stretch at 60 Elevation  - 1 x daily - 7 x weekly - 3 sets - 10 reps - Circular Shoulder Pendulum with Table Support  - 1 x daily - 7 x weekly - 3 sets - 10  reps - Supine Shoulder Flexion Extension AAROM with Dowel  - 1 x daily - 7 x weekly - 3 sets - 10 reps ASSESSMENT:  CLINICAL IMPRESSION: Patient is a 58 y.o. who participated  today in physical therapy treatment for L shoulder bursitis. Treatment focused on additional strengthening for all shoulder muscles then continuing with STM and stretch to prevent re-occurrence of impingement. Some soreness after last session that's did eventually go away. She has a springy end feel with LUE PROM, pain is the biggest limiting factor. Cues needed to relax with PROM  OBJECTIVE IMPAIRMENTS: decreased activity tolerance, decreased coordination, decreased ROM, decreased strength, increased muscle spasms, impaired  flexibility, impaired UE functional use, improper body mechanics, and pain.   ACTIVITY LIMITATIONS: carrying, lifting, sleeping, bathing, dressing, reach over head, and hygiene/grooming  PARTICIPATION LIMITATIONS: meal prep, cleaning, laundry, shopping, community activity, and occupation  PERSONAL FACTORS: Past/current experiences are also affecting patient's functional outcome.   REHAB POTENTIAL: Good  CLINICAL DECISION MAKING: Stable/uncomplicated  EVALUATION COMPLEXITY: Low   GOALS: Goals reviewed with patient? Yes  SHORT TERM GOALS: Target date: 05/05/23  I with initial HEP Baseline: Goal status: 04/22/23-HEP provided, ongoing  LONG TERM GOALS: Target date: 06/26/23  I with final HEP Baseline:  Goal status: INITIAL  2.  Improve her Quick Dash score to <21% to achieve mild impairment. Baseline:  Goal status: INITIAL  3.  Patient will achieve full L shoulder AROM with pain < 3/10 Baseline:  Goal status: 05/13/23- Patient reports improved pain, but still grabs her at times, > 3/10, ongoing  4.  Patient will achieve L shoulder strength of at least 4/5 Baseline:  Goal status: INITIAL  5.  Patient will report the ability to perform all of her normal daily activities with L shoulder pain < 3/10 Baseline:  Goal status: 05/13/23-Improving ability to do her hair, but still occasionally > 3/10, ongoing  PLAN:  PT FREQUENCY: 2x/week  PT DURATION: 10 weeks  PLANNED INTERVENTIONS: 97110-Therapeutic exercises, 97530- Therapeutic activity, 97112- Neuromuscular re-education, 97535- Self Care, 16109- Manual therapy, 97014- Electrical stimulation (unattended), 97016- Vasopneumatic device, 97035- Ultrasound, 60454- Ionotophoresis 4mg /ml Dexamethasone, Patient/Family education, Taping, Dry Needling, Joint mobilization, Cryotherapy, and Moist heat  PLAN FOR NEXT SESSION:  STM and stretch, strengthening, update HEP   Debroah Baller PTA 05/25/23 12:59 PM

## 2023-05-27 ENCOUNTER — Ambulatory Visit: Payer: 59 | Admitting: Physical Therapy

## 2023-06-26 ENCOUNTER — Ambulatory Visit (HOSPITAL_BASED_OUTPATIENT_CLINIC_OR_DEPARTMENT_OTHER): Payer: 59 | Admitting: Family

## 2023-06-26 ENCOUNTER — Encounter (HOSPITAL_BASED_OUTPATIENT_CLINIC_OR_DEPARTMENT_OTHER): Payer: Self-pay | Admitting: Family

## 2023-06-26 VITALS — BP 128/78 | HR 108 | Ht 64.0 in | Wt 181.7 lb

## 2023-06-26 DIAGNOSIS — I10 Essential (primary) hypertension: Secondary | ICD-10-CM

## 2023-06-26 DIAGNOSIS — I25118 Atherosclerotic heart disease of native coronary artery with other forms of angina pectoris: Secondary | ICD-10-CM

## 2023-06-26 DIAGNOSIS — I5022 Chronic systolic (congestive) heart failure: Secondary | ICD-10-CM

## 2023-06-26 DIAGNOSIS — E785 Hyperlipidemia, unspecified: Secondary | ICD-10-CM | POA: Diagnosis not present

## 2023-06-26 DIAGNOSIS — R002 Palpitations: Secondary | ICD-10-CM

## 2023-06-26 NOTE — Progress Notes (Signed)
 Cardiology Office Note:  .   Date:  06/26/2023  ID:  Kresha Abelson, DOB 08-13-1965, MRN [id number] PCP: Merri Brunette, MD  [facility] Providers Cardiologist:  [name], MD Cardiology APP:  Alver Sorrow, NP    History of Present Illness: .   Sandra Owens is a 58 y.o. female with hx of CAD (PCI-mLAD 2017, NSTEMI 03/2023), HLD, chronic systolic and diastolic heart failure with recovered LVEF,  HTN, PAD (occluded R SFA), tobacco use.  Prior PCI-mLAD 2017.   Admitted 11/7-11/9/24 with NSTEMI. Chest pain started 3 weeks prior but was progressing. HS toponin 10 ? 159. LHC 03/12/23 patent mid LAD stenot without restenosis, moderate mid Cx stenosis does not appear to be flow limiting. Recommended for medical management. Infarct vessel distal RCA with total occusion just before PDA takeoff with collaterals. No option for PCI given small size of PDA. Recommended DAPT ASA/Plavix x 1 year. Echo normal LVEF 50-55%. Imdur added prior to discharge. LDL 138 and Lp(a) 155.5  Seen in follow-up 03/20/2023.  Atorvastatin was stopped due to myalgias and she was started on Crestor.  Path also initiated.  Presents today for follow up independently.  Notes like grams are not as bad on Repatha and rosuvastatin as they were on atorvastatin.  She is feeling overall well.  Return to working second shift.  She reports brief episodes of palpitations that self resolved.  Drinks 2 cups of coffee in the morning.  Has difficulty maintaining consistent sleep schedule due to working second shift.  No chest pain, exertional dyspnea, edema, orthopnea, PND.   ROS: Please see the history of present illness.    All other systems reviewed and are negative.   Studies Reviewed: .           Risk Assessment/Calculations:             Physical Exam:   VS:  BP 128/78 (BP Location: Left Arm, Patient Position: Sitting, Cuff Size: Normal)   Pulse (!) 108   Ht 5\' 4"  (1.626 m)   Wt 181 lb  11.2 oz (82.4 kg)   LMP 08/05/2015 (Approximate)   SpO2 98%   BMI 31.19 kg/m    Wt Readings from Last 3 Encounters:  06/26/23 181 lb 11.2 oz (82.4 kg)  03/20/23 177 lb 4.8 oz (80.4 kg)  03/14/23 171 lb 9.6 oz (77.8 kg)    GEN: Well nourished, well developed in no acute distress NECK: No JVD; No carotid bruits CARDIAC: RRR, no murmurs, rubs, gallops RESPIRATORY:  Clear to auscultation without rales, wheezing or rhonchi  ABDOMEN: Soft, non-tender, non-distended EXTREMITIES:  No edema; No deformity   ASSESSMENT AND PLAN: .    CAD / HLD, LDL goal <55 / Familial hyperlipidemia - Prior DES 2017 with more recent NSTEMI 03/2023 with distal RCA not amenable to PCI with collaterals. Plan for medical management. GDMT Aspirin, Coreg, Plavix, Farxiga, rosuvastatin, Repatha.Marland Kitchen DAPT for 1 year from NSTEMI.  Labs today lipid panel, LPA  Tobacco use - Smoking cessation encouraged. Recommend utilization of [id number].   Chronic systolic and diastolic heart failure with recovered LVEF - Normal LVEF by echo [id number]. Euvolemic and well compensated on exam. GDMT Imdur 15 mg daily, Entresto 97-23 mg twice daily, Farxiga 10 mg daily. No need for loop diuretic.   HTN - BP well controlled. Continue current antihypertensive regimen carvedilol 25 mg twice daily, Imdur 15 milligrams daily, Entresto 97-23 mg twice daily.  Palpitations -endorses brief episodes of palpitations.  Encouraged to  reduce caffeine intake, increase hydration, manage stress well.  Continue present regimen carvedilol 25 mg twice daily.  If symptoms worsen, she will contact her office and consider ZIO monitor.  Deferred today is overall not bothersome. CMET, mag, CBC today to rule out electrolyte abnormality or anemia as contributory.  DM2 - Continue to follow with PCP.        Dispo: follow up in 4-6 mos  Signed, Alver Sorrow, NP

## 2023-06-26 NOTE — Patient Instructions (Signed)
Medication Instructions:  Your physician recommends that you continue on your current medications as directed. Please refer to the Current Medication list given to you today.  *If you need a refill on your cardiac medications before your next appointment, please call your pharmacy*   Lab Work: Labs today   Follow-Up: At [facility], you and your health needs are our priority.  As part of our continuing mission to provide you with exceptional heart care, we have created designated Provider Care Teams.  These Care Teams include your primary Cardiologist (physician) and Advanced Practice Providers (APPs -  Physician Assistants and Nurse Practitioners) who all work together to provide you with the care you need, when you need it.  We recommend signing up for the patient portal called "MyChart".  Sign up information is provided on this After Visit Summary.  MyChart is used to connect with patients for Virtual Visits (Telemedicine).  Patients are able to view lab/test results, encounter notes, upcoming appointments, etc.  Non-urgent messages can be sent to your provider as well.   To learn more about what you can do with MyChart, go to [email or web address].    Your next appointment:   4-6 months with Dr. Duke Salvia, Gillian Shields, NP or Eligha Bridegroom, NP   Other Instructions To prevent palpitations: Make sure you are adequately hydrated.  Avoid and/or limit caffeine containing beverages like soda or tea. Exercise regularly.  Manage stress well. Some over the counter medications can cause palpitations such as Benadryl, AdvilPM, TylenolPM. Regular Advil or Tylenol do not cause palpitations.

## 2023-06-30 ENCOUNTER — Encounter (HOSPITAL_BASED_OUTPATIENT_CLINIC_OR_DEPARTMENT_OTHER): Payer: Self-pay

## 2023-06-30 LAB — CBC
Hematocrit: 47.6 % — ABNORMAL HIGH (ref 34.0–46.6)
Hemoglobin: 15.4 g/dL (ref 11.1–15.9)
MCH: 23.4 pg — ABNORMAL LOW (ref 26.6–33.0)
MCHC: 32.4 g/dL (ref 31.5–35.7)
MCV: 73 fL — ABNORMAL LOW (ref 79–97)
Platelets: 291 10*3/uL (ref 150–450)
RBC: 6.57 x10E6/uL — ABNORMAL HIGH (ref 3.77–5.28)
RDW: 17.1 % — ABNORMAL HIGH (ref 11.7–15.4)
WBC: 6 10*3/uL (ref 3.4–10.8)

## 2023-06-30 LAB — COMPREHENSIVE METABOLIC PANEL
ALT: 22 [IU]/L (ref 0–32)
AST: 18 [IU]/L (ref 0–40)
Albumin: 4.3 g/dL (ref 3.8–4.9)
Alkaline Phosphatase: 65 [IU]/L (ref 44–121)
BUN/Creatinine Ratio: 12 (ref 9–23)
BUN: 14 mg/dL (ref 6–24)
Bilirubin Total: 0.2 mg/dL (ref 0.0–1.2)
CO2: 23 mmol/L (ref 20–29)
Calcium: 9.6 mg/dL (ref 8.7–10.2)
Chloride: 105 mmol/L (ref 96–106)
Creatinine, Ser: 1.19 mg/dL — ABNORMAL HIGH (ref 0.57–1.00)
Globulin, Total: 2.8 g/dL (ref 1.5–4.5)
Glucose: 162 mg/dL — ABNORMAL HIGH (ref 70–99)
Potassium: 4.9 mmol/L (ref 3.5–5.2)
Sodium: 142 mmol/L (ref 134–144)
Total Protein: 7.1 g/dL (ref 6.0–8.5)
eGFR: 53 mL/min/{1.73_m2} — ABNORMAL LOW (ref >59)

## 2023-06-30 LAB — LIPID PANEL
Chol/HDL Ratio: 1.7 {ratio} (ref 0.0–4.4)
Cholesterol, Total: 76 mg/dL — ABNORMAL LOW (ref 100–199)
HDL: 45 mg/dL (ref >39)
LDL Chol Calc (NIH): 15 mg/dL (ref 0–99)
Triglycerides: 71 mg/dL (ref 0–149)
VLDL Cholesterol Cal: 16 mg/dL (ref 5–40)

## 2023-06-30 LAB — LIPOPROTEIN A (LPA): Lipoprotein (a): 40.3 nmol/L (ref <75.0)

## 2023-06-30 LAB — MAGNESIUM: Magnesium: 2.1 mg/dL (ref 1.6–2.3)

## 2023-06-30 LAB — TSH: TSH: 2.3 u[IU]/mL (ref 0.450–4.500)

## 2023-07-02 ENCOUNTER — Encounter (HOSPITAL_BASED_OUTPATIENT_CLINIC_OR_DEPARTMENT_OTHER): Payer: Self-pay | Admitting: Family

## 2023-09-15 IMAGING — MG MM DIGITAL SCREENING BILAT W/ TOMO AND CAD
8 series · 8 of 24 positions shown · non-contrast
Comparison: Previous exam(s).

CLINICAL DATA: Screening.

EXAM:
DIGITAL SCREENING BILATERAL MAMMOGRAM WITH TOMOSYNTHESIS AND CAD
TECHNIQUE: Bilateral screening digital craniocaudal and mediolateral oblique
mammograms were obtained. Bilateral screening digital breast
tomosynthesis was performed. The images were evaluated with
computer-aided detection.

[L CC synth-2D]
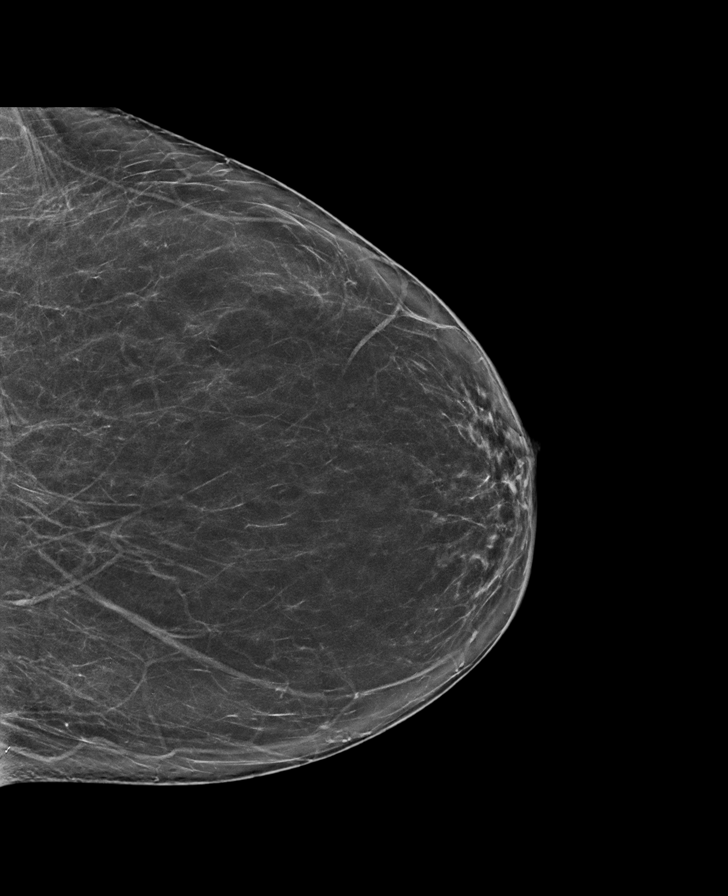

[R CC synth-2D]
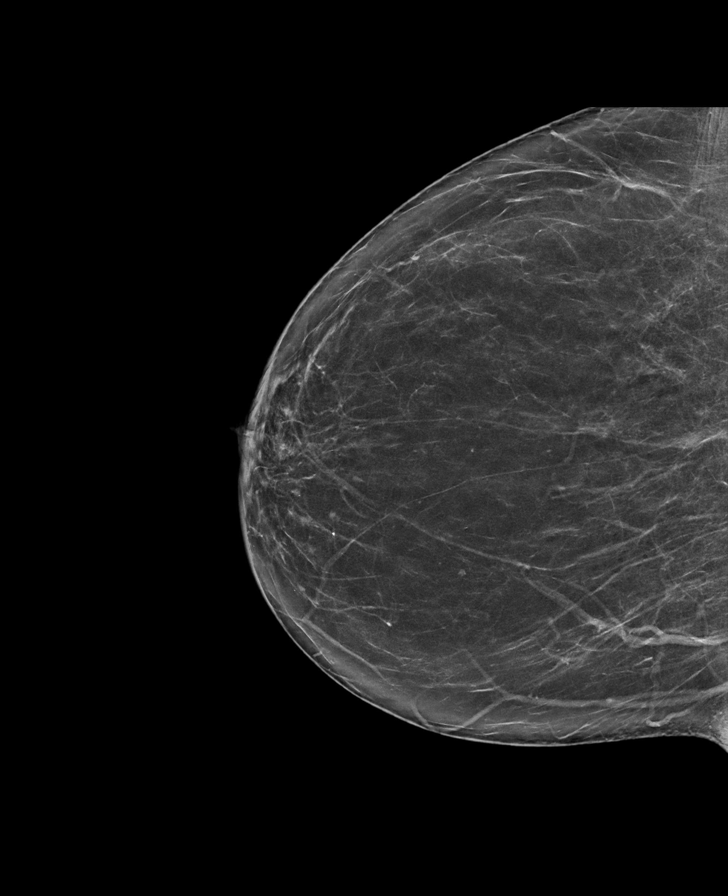

[R MLO synth-2D]
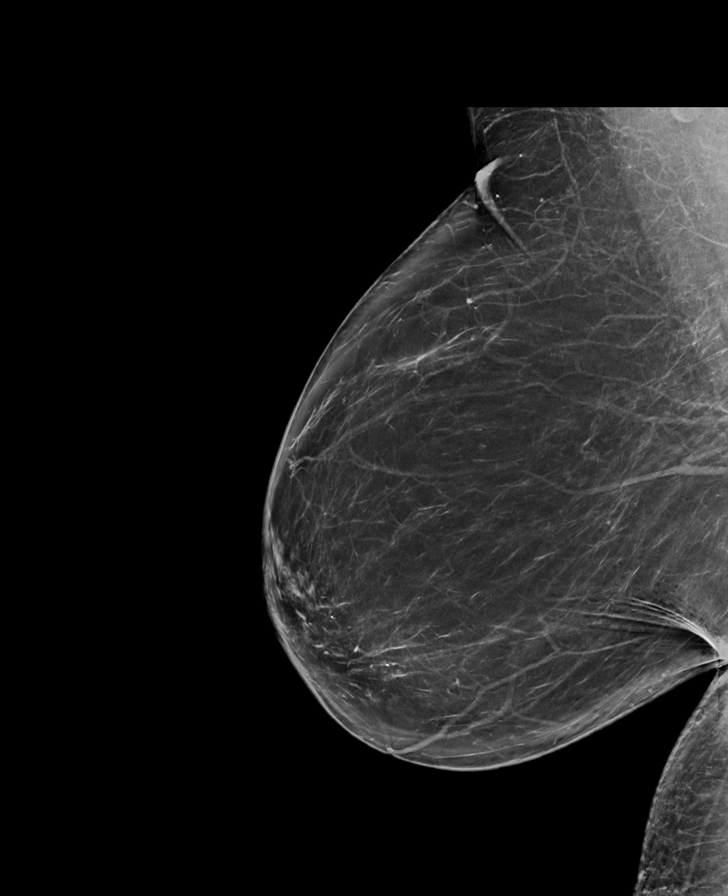

[L MLO synth-2D]
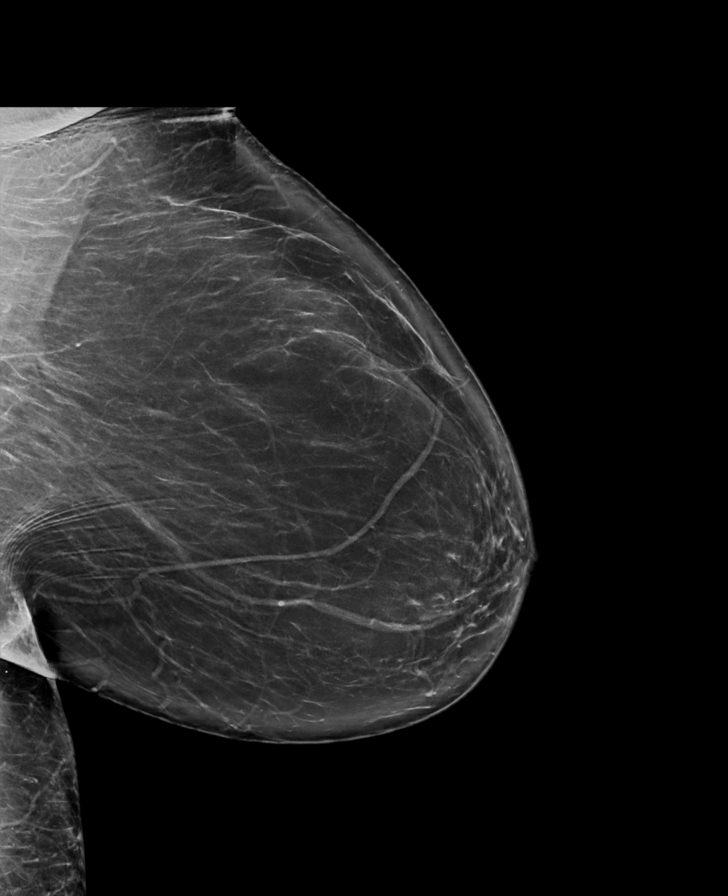

[R CC tomo · tomo slice 37/73.0]
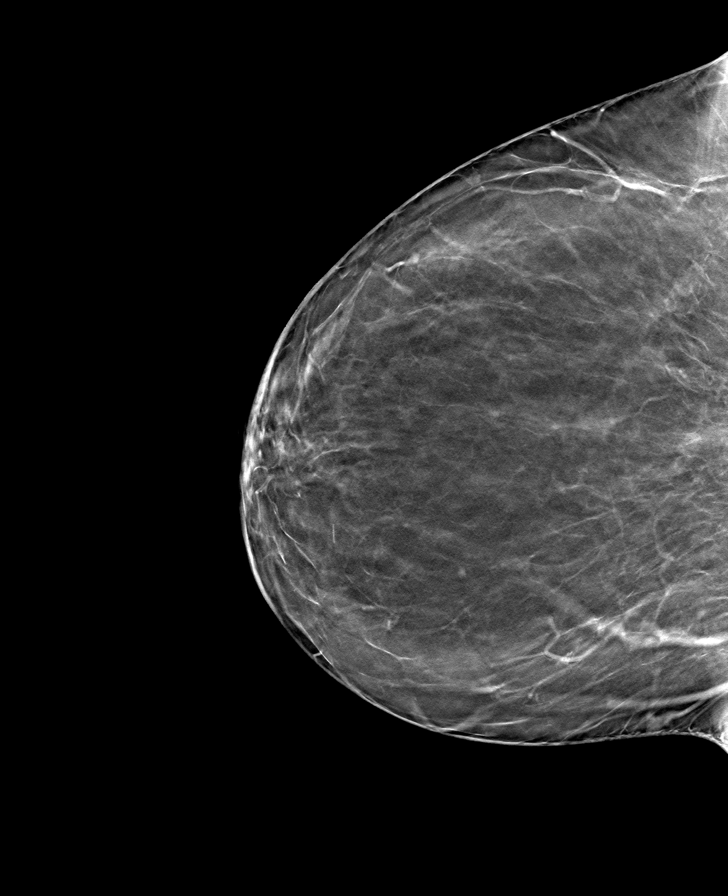

[R MLO tomo · tomo slice 44/87.0]
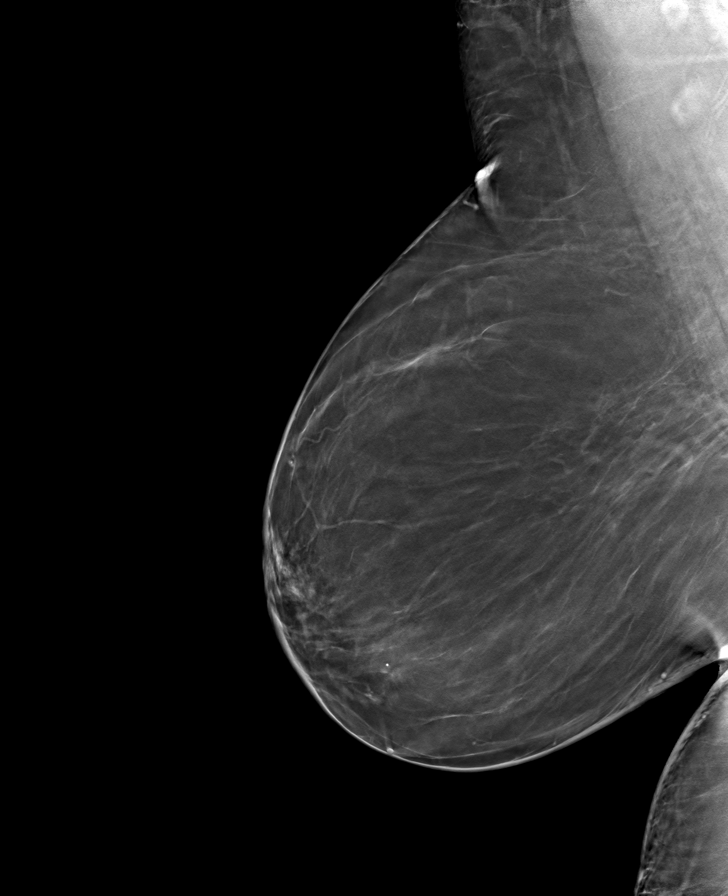

[L CC tomo · tomo slice 37/74.0]
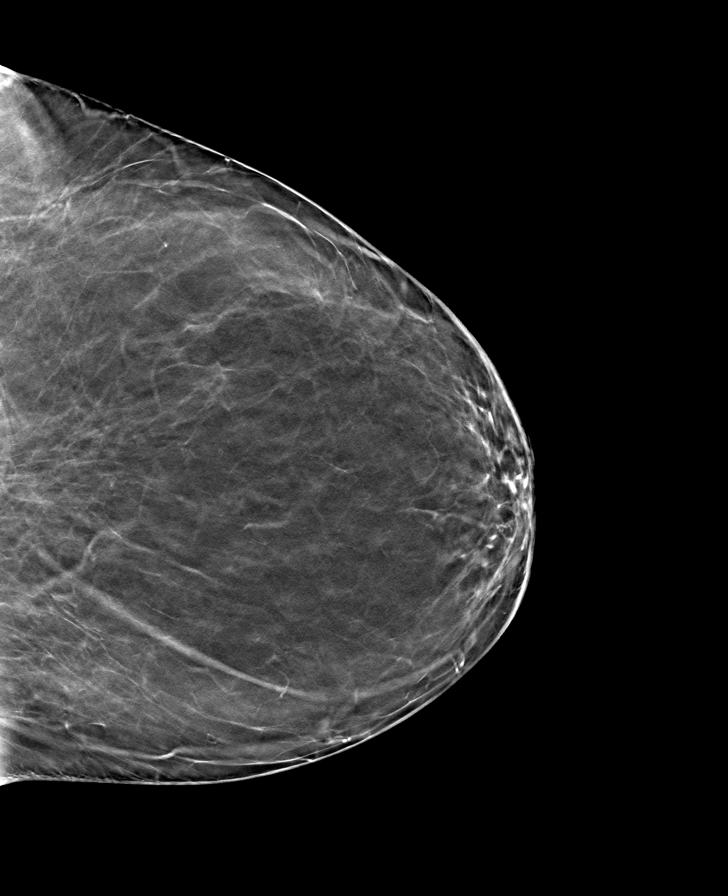

[L MLO tomo · tomo slice 45/89.0]
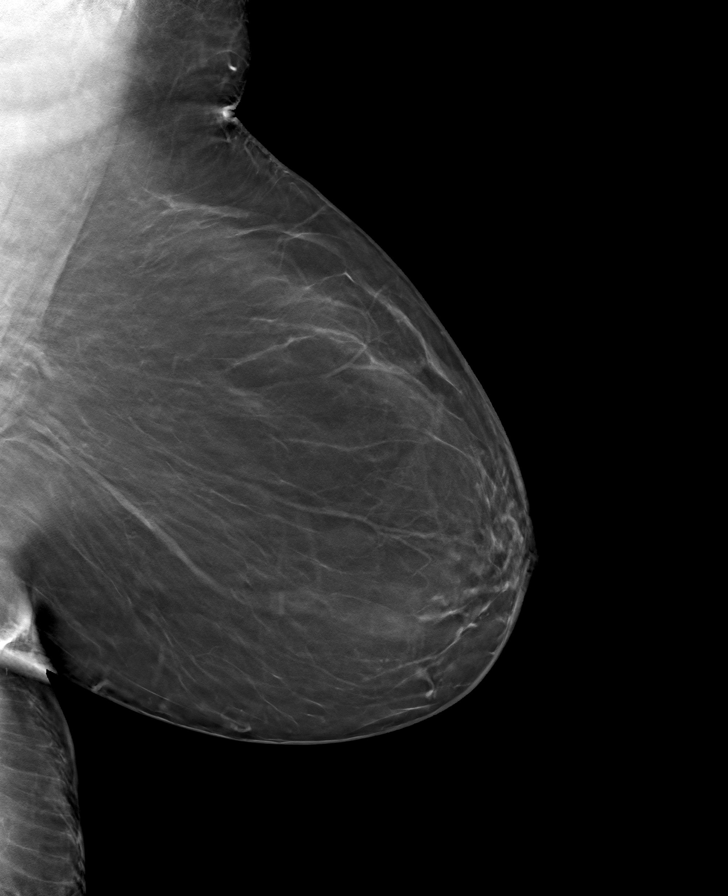

[8 of 24 positions shown; findings below may reference images not displayed]

ACR Breast Density Category b: There are scattered areas of
fibroglandular density.
FINDINGS: There are no findings suspicious for malignancy.
IMPRESSION: No mammographic evidence of malignancy. A result letter of this
screening mammogram will be mailed directly to the patient.

RECOMMENDATION:
Screening mammogram in one year. (Code:51-O-LD2)

BI-RADS CATEGORY  1: Negative.

## 2023-10-06 ENCOUNTER — Other Ambulatory Visit (HOSPITAL_BASED_OUTPATIENT_CLINIC_OR_DEPARTMENT_OTHER): Payer: Self-pay | Admitting: Family

## 2023-10-06 DIAGNOSIS — I25118 Atherosclerotic heart disease of native coronary artery with other forms of angina pectoris: Secondary | ICD-10-CM

## 2023-10-26 ENCOUNTER — Encounter (HOSPITAL_BASED_OUTPATIENT_CLINIC_OR_DEPARTMENT_OTHER): Payer: Self-pay | Admitting: Nurse Practitioner

## 2023-10-26 ENCOUNTER — Ambulatory Visit (HOSPITAL_BASED_OUTPATIENT_CLINIC_OR_DEPARTMENT_OTHER): Payer: 59 | Admitting: Nurse Practitioner

## 2023-10-26 VITALS — BP 134/81 | HR 114 | Ht 64.0 in | Wt 177.6 lb

## 2023-10-26 DIAGNOSIS — Z720 Tobacco use: Secondary | ICD-10-CM

## 2023-10-26 DIAGNOSIS — I10 Essential (primary) hypertension: Secondary | ICD-10-CM | POA: Diagnosis not present

## 2023-10-26 DIAGNOSIS — F419 Anxiety disorder, unspecified: Secondary | ICD-10-CM

## 2023-10-26 DIAGNOSIS — E785 Hyperlipidemia, unspecified: Secondary | ICD-10-CM

## 2023-10-26 DIAGNOSIS — R252 Cramp and spasm: Secondary | ICD-10-CM

## 2023-10-26 DIAGNOSIS — R42 Dizziness and giddiness: Secondary | ICD-10-CM

## 2023-10-26 DIAGNOSIS — I25118 Atherosclerotic heart disease of native coronary artery with other forms of angina pectoris: Secondary | ICD-10-CM | POA: Diagnosis not present

## 2023-10-26 MED ORDER — BUPROPION HCL ER (XL) 150 MG PO TB24: 150.0000 mg | ORAL_TABLET | Freq: Two times a day (BID) | ORAL | 3 refills | Status: AC

## 2023-10-26 MED ORDER — CLOPIDOGREL BISULFATE 75 MG PO TABS: 75.0000 mg | ORAL_TABLET | Freq: Every day | ORAL | 3 refills | Status: AC

## 2023-10-26 MED ORDER — ISOSORBIDE MONONITRATE ER 30 MG PO TB24: 15.0000 mg | ORAL_TABLET | Freq: Every day | ORAL | 3 refills | Status: AC

## 2023-10-26 NOTE — Progress Notes (Signed)
 Cardiology Office Note   Date:  10/26/2023  ID:  Sandra, Owens 07-11-1965, MRN [id number] PCP: Claudene Pellet, MD  [facility] Providers Cardiologist:  Annabella Scarce, MD Cardiology APP:  Vannie Reche RAMAN, NP     [facility] Coronary artery disease PCI to mLAD 2017 NSTEMI 03/2023 LHC 03/12/2023  Patent mLAD stent w/out restenosis Moderate mid LCx stenosis, not flow limiting Infarct vessel distal RCA with total occlusion (no option for PCI given small size PDA) Medical Rx Dyslipidemia Tobacco abuse Chronic combined systolic and diastolic heart failure with recovered LVEF Hypertension Palpitations Diabetes Elevated lipoprotein a  Echo 01/16/2023 showed mildly reduced EF 45 to 50%, previously noted to be 60 to 65% on echo 2021.  Plan to follow-up with nuclear stress testing to rule out worsening CAD, however she was admitted 03/12/2023.  Admission 11/7-11/01/2023 with NSTEMI. Hs troponin 10>>159. LHC as noted above, medical management. Distal RCA with total occlusion just before PDA takeoff with collaterals.  No option for PCI given small size of PDA.  Recommendation DAPT aspirin /Plavix  x 1 year.  Echo normal LVEF 50 to 55%.  Imdur  added prior to discharge.  LDL 138 and LPA 155.5.  Seen in follow-up 03/20/2023.  Atorvastatin  was stopped due to myalgias and she was started on Crestor .  Repatha  was also initiated.  She had returned to working second shift.  Reported 2 cups of coffee in the mornings.  Has difficulty maintaining a consistent sleep schedule due to working second shift.  No chest pain, dyspnea, or other concerning cardiac symptoms.  She endorsed brief episodes of palpitations.  Lab results revealed slight decline in kidney function, recommendation to increase oral hydration.  Normal liver enzymes, normal electrolytes, normal thyroid  function, no evidence of anemia or infection.  Significant improvement in cholesterol with LDL-C of 15, triglycerides 71, HDL 45,  and total cholesterol 76.  History of Present Illness Discussed the use of AI scribe software for clinical note transcription with the patient, who gave verbal consent to proceed.  History of Present Illness Sandra Owens is a very pleasant 58 year old female who presents for management of coronary artery disease. She reports she is feeling well. She experiences occasional lightheadedness, which she associates with her work schedule and possibly her medication. She works second shift and takes her medication in the morning with coffee. Lightheadedness occurs more often in the afternoons. No presyncope or syncope. She often wakes up between 10 and 11 am, might eat a light breakfast and then eat a bigger meal between 8 an 9 am. She admits she often snacks on chips when she gets home from work around midnight.  Also has severe leg cramps and has considered magnesium supplements in the past. She is unsure which type of magnesium is most beneficial. She has been dealing with leg cramps for years. Unfortunately, she continues to smoke despite restrictions at work. She describes smoking as a long-standing habit and a 'treat'. She has attempted to reduce her smoking but finds it difficult due to her busy lifestyle and the habit's ingrained nature. Occasional palpitations, which she feels she has had all her life. No regular exercise routine. She denies chest pain, shortness of breath, orthopnea, PND, edema.   ROS: See HPI  Studies Reviewed       Lipoprotein (a)  Date/Time Value Ref Range Status  06/26/2023 11:40 AM 40.3 <75.0 nmol/L Final    Comment:    Note:  Values greater than or equal to 75.0 nmol/L may  indicate an independent risk factor for CHD,        but must be evaluated with caution when applied        to non-Caucasian populations due to the        influence of genetic factors on Lp(a) across        ethnicities.     Risk Assessment/Calculations           Physical  Exam VS:  BP 134/81   Pulse (!) 114   Ht 5' 4 (1.626 m)   Wt 177 lb 9.6 oz (80.6 kg)   LMP 08/05/2015 (Approximate)   SpO2 97%   BMI 30.48 kg/m    Wt Readings from Last 3 Encounters:  10/26/23 177 lb 9.6 oz (80.6 kg)  06/26/23 181 lb 11.2 oz (82.4 kg)  03/20/23 177 lb 4.8 oz (80.4 kg)    GEN: Well nourished, well developed in no acute distress NECK: No JVD; No carotid bruits CARDIAC: RRR, no murmurs, rubs, gallops RESPIRATORY:  Clear to auscultation without rales, wheezing or rhonchi  ABDOMEN: Soft, non-tender, non-distended EXTREMITIES:  No edema; No deformity   Assessment & Plan Coronary artery disease    Right and left heart cath 03/12/2023 with patent mid LAD stent without restenosis.  Moderate mid circumflex stenosis that does not appear to be flow-limiting.  Dominant RCA with mild disease in the mid vessel.  Occlusion of distal RCA just before the takeoff of small caliber PDA.  PDA (likely culprit vessel) filling from left to right collaterals.  Normal right heart pressure.  She denies chest pain, dyspnea, or other symptoms concerning for unstable angina.  Has lightheadedness that generally occurs in the afternoon after being awake for several hours, taking cardiac medications and not eating much.  She is on semaglutide which may be contributing to decreased appetite and prolonged periods of fasting.  Encouraged her to aim to increase nutritional eating and ensure good hydration. Monitor BP to evaluate for hypotension and report worsening symptoms. Continue GDMT including aspirin , carvedilol , clopidogrel , Farxiga , Repatha , Imdur , rosuvastatin , and Entresto .   Hypertension   BP felt to be generally well-controlled, although she admits she does not check it during times of lightheadedness. Encouraged her to monitor BP, especially later in the day. Encouraged regular eating and hydration to prevent drops in blood pressure. Continue anti-hypertensive therapy and notify us  if symptoms  worsen.  We will recheck renal function and electrolytes today to ensure abnormality is not contributing.  Lightheadedness   She experiences intermittent lightheadedness, possibly due to medication, blood pressure or blood glucose fluctuations.  Encouraged better nutrition and hydration. Encouraged routine BP monitoring and notify us  if episodes/symptoms worsen.   Hyperlipidemia LDL goal < 55   Lipid panel completed 06/26/2023 with total cholesterol 76, triglycerides 71, HDL 45, and LDL-C 15.  She is experiencing leg cramps but admits these have been persistent for quite a few years.  Consider magnesium supplementation for leg cramps.  Continue Repatha , rosuvastatin .  Leg cramps   Persistent leg cramps may be due to electrolyte imbalances or medication side effects. Recommend magnesium glycinate supplementation and check potassium and kidney function.  Nicotine  dependence   She has ongoing nicotine  dependence and is interested in Wellbutrin for cessation. We will prescribe Wellbutrin 150 mg twice daily for smoking cessation. Complete cessation encouraged.   Anxiety   Anxiety is present, and Wellbutrin may help manage symptoms. Start Wellbutrin 150 mg twice daily and monitor for improvement in anxiety symptoms.  Dispo: 6 months with Dr. Raford or APP  Signed, Rosaline Bane, NP-C

## 2023-10-26 NOTE — Patient Instructions (Addendum)
 Medication Instructions:  Your physician has recommended you make the following change in your medication:   Start: Wellbutrin 150mg  twice daily for 3 months- let us  know if you need more!   You can try Magnesium Glycinate or Threonate for cramping.   We have sent in refills for Imdur  & Plavix    Lab Work: CMP today   Follow-Up:  Please follow up in 6 months  with Dr. Raford, Rosaline Bane, NP or Reche Finder, NP

## 2023-10-27 ENCOUNTER — Ambulatory Visit: Payer: Self-pay | Admitting: Nurse Practitioner

## 2023-10-27 ENCOUNTER — Telehealth (HOSPITAL_BASED_OUTPATIENT_CLINIC_OR_DEPARTMENT_OTHER): Payer: Self-pay | Admitting: Nurse Practitioner

## 2023-10-27 DIAGNOSIS — R7401 Elevation of levels of liver transaminase levels: Secondary | ICD-10-CM

## 2023-10-27 DIAGNOSIS — E875 Hyperkalemia: Secondary | ICD-10-CM

## 2023-10-27 LAB — COMPREHENSIVE METABOLIC PANEL WITH GFR
ALT: 38 IU/L — ABNORMAL HIGH (ref 0–32)
AST: 30 IU/L (ref 0–40)
Albumin: 4.2 g/dL (ref 3.8–4.9)
Alkaline Phosphatase: 61 IU/L (ref 44–121)
BUN/Creatinine Ratio: 16 (ref 9–23)
BUN: 16 mg/dL (ref 6–24)
Bilirubin Total: 0.3 mg/dL (ref 0.0–1.2)
CO2: 22 mmol/L (ref 20–29)
Calcium: 9.7 mg/dL (ref 8.7–10.2)
Chloride: 107 mmol/L — ABNORMAL HIGH (ref 96–106)
Creatinine, Ser: 0.97 mg/dL (ref 0.57–1.00)
Globulin, Total: 2.5 g/dL (ref 1.5–4.5)
Glucose: 151 mg/dL — ABNORMAL HIGH (ref 70–99)
Potassium: 5.4 mmol/L — ABNORMAL HIGH (ref 3.5–5.2)
Sodium: 142 mmol/L (ref 134–144)
Total Protein: 6.7 g/dL (ref 6.0–8.5)
eGFR: 67 mL/min/{1.73_m2} (ref >59)

## 2023-10-27 NOTE — Telephone Encounter (Signed)
 Pt c/o medication issue:  1. Name of Medication: Wellbutrin XL  2. How are you currently taking this medication (dosage and times per day)?   3. Are you having a reaction (difficulty breathing--STAT)?   4. What is your medication issue?  Need to clarify directions. Should it be 1 time a day or 2 times a day

## 2023-10-27 NOTE — Telephone Encounter (Signed)
 Called pt's pharmacy to clarify that pt takes medication wellbutrin BID, once in the morning and at bedtime. Pharmacist verbalized understanding.

## 2024-02-19 ENCOUNTER — Other Ambulatory Visit (HOSPITAL_COMMUNITY): Payer: Self-pay | Admitting: Cardiovascular Disease
